# Patient Record
Sex: Male | Born: 1940
Health system: Southern US, Community
[De-identification: ages and names within clinical notes are randomized; demographics above are authoritative.]

## PROBLEM LIST (undated history)

## (undated) DIAGNOSIS — K219 Gastro-esophageal reflux disease without esophagitis: Secondary | ICD-10-CM

## (undated) DIAGNOSIS — T8859XA Other complications of anesthesia, initial encounter: Secondary | ICD-10-CM

## (undated) DIAGNOSIS — R7303 Prediabetes: Secondary | ICD-10-CM

## (undated) DIAGNOSIS — I10 Essential (primary) hypertension: Secondary | ICD-10-CM

## (undated) DIAGNOSIS — M1990 Unspecified osteoarthritis, unspecified site: Secondary | ICD-10-CM

## (undated) DIAGNOSIS — R7989 Other specified abnormal findings of blood chemistry: Secondary | ICD-10-CM

## (undated) DIAGNOSIS — Z87442 Personal history of urinary calculi: Secondary | ICD-10-CM

## (undated) DIAGNOSIS — N400 Enlarged prostate without lower urinary tract symptoms: Secondary | ICD-10-CM

## (undated) DIAGNOSIS — N10 Acute tubulo-interstitial nephritis: Secondary | ICD-10-CM

## (undated) DIAGNOSIS — D126 Benign neoplasm of colon, unspecified: Secondary | ICD-10-CM

## (undated) DIAGNOSIS — K5790 Diverticulosis of intestine, part unspecified, without perforation or abscess without bleeding: Secondary | ICD-10-CM

## (undated) HISTORY — PX: VASECTOMY: SHX75

## (undated) HISTORY — PX: CARPAL TUNNEL RELEASE: SHX101

## (undated) HISTORY — PX: HERNIA REPAIR: SHX51

## (undated) HISTORY — DX: Diverticulosis of intestine, part unspecified, without perforation or abscess without bleeding: K57.90

## (undated) HISTORY — DX: Benign neoplasm of colon, unspecified: D12.6

## (undated) HISTORY — PX: PROSTATE BIOPSY: SHX241

## (undated) HISTORY — PX: EYE SURGERY: SHX253

## (undated) HISTORY — DX: Acute pyelonephritis: N10

## (undated) HISTORY — PX: OTHER SURGICAL HISTORY: SHX169

## (undated) HISTORY — DX: Benign prostatic hyperplasia without lower urinary tract symptoms: N40.0

## (undated) HISTORY — PX: COLONOSCOPY: SHX174

---

## 2004-09-02 ENCOUNTER — Ambulatory Visit: Payer: Self-pay | Admitting: Family Medicine

## 2004-09-25 ENCOUNTER — Ambulatory Visit: Payer: Self-pay | Admitting: Internal Medicine

## 2005-05-27 ENCOUNTER — Ambulatory Visit: Payer: Self-pay | Admitting: Internal Medicine

## 2005-07-28 ENCOUNTER — Ambulatory Visit: Payer: Self-pay | Admitting: Internal Medicine

## 2005-08-05 ENCOUNTER — Ambulatory Visit: Payer: Self-pay | Admitting: Internal Medicine

## 2005-08-19 ENCOUNTER — Ambulatory Visit: Payer: Self-pay | Admitting: Cardiology

## 2005-08-27 ENCOUNTER — Ambulatory Visit: Payer: Self-pay

## 2007-02-08 ENCOUNTER — Ambulatory Visit: Payer: Self-pay | Admitting: Internal Medicine

## 2007-02-08 DIAGNOSIS — R7989 Other specified abnormal findings of blood chemistry: Secondary | ICD-10-CM | POA: Insufficient documentation

## 2007-02-08 DIAGNOSIS — K4090 Unilateral inguinal hernia, without obstruction or gangrene, not specified as recurrent: Secondary | ICD-10-CM | POA: Insufficient documentation

## 2007-02-08 LAB — CONVERTED CEMR LAB
Creatinine,U: 193.2 mg/dL
Hgb A1c MFr Bld: 6.2 % — ABNORMAL HIGH (ref 4.6–6.0)
Microalb Creat Ratio: 4.7 mg/g (ref 0.0–30.0)
Microalb, Ur: 0.9 mg/dL (ref 0.0–1.9)

## 2007-04-01 ENCOUNTER — Encounter: Admission: RE | Admit: 2007-04-01 | Discharge: 2007-04-01 | Payer: Self-pay | Admitting: General Surgery

## 2007-04-05 ENCOUNTER — Ambulatory Visit (HOSPITAL_BASED_OUTPATIENT_CLINIC_OR_DEPARTMENT_OTHER): Admission: RE | Admit: 2007-04-05 | Discharge: 2007-04-05 | Payer: Self-pay | Admitting: General Surgery

## 2007-05-07 ENCOUNTER — Ambulatory Visit: Payer: Self-pay | Admitting: Internal Medicine

## 2007-05-07 DIAGNOSIS — R42 Dizziness and giddiness: Secondary | ICD-10-CM | POA: Insufficient documentation

## 2007-07-13 ENCOUNTER — Ambulatory Visit: Payer: Self-pay | Admitting: Internal Medicine

## 2007-07-13 DIAGNOSIS — M25469 Effusion, unspecified knee: Secondary | ICD-10-CM

## 2007-07-20 ENCOUNTER — Encounter: Payer: Self-pay | Admitting: Internal Medicine

## 2007-07-20 ENCOUNTER — Ambulatory Visit (HOSPITAL_COMMUNITY): Admission: RE | Admit: 2007-07-20 | Discharge: 2007-07-20 | Payer: Self-pay | Admitting: Orthopedic Surgery

## 2007-07-21 ENCOUNTER — Encounter: Admission: RE | Admit: 2007-07-21 | Discharge: 2007-07-21 | Payer: Self-pay | Admitting: Orthopedic Surgery

## 2008-05-01 ENCOUNTER — Ambulatory Visit: Payer: Self-pay | Admitting: Internal Medicine

## 2008-05-01 DIAGNOSIS — K5730 Diverticulosis of large intestine without perforation or abscess without bleeding: Secondary | ICD-10-CM | POA: Insufficient documentation

## 2008-05-01 DIAGNOSIS — G5600 Carpal tunnel syndrome, unspecified upper limb: Secondary | ICD-10-CM | POA: Insufficient documentation

## 2008-05-01 DIAGNOSIS — K219 Gastro-esophageal reflux disease without esophagitis: Secondary | ICD-10-CM

## 2008-05-01 DIAGNOSIS — M7580 Other shoulder lesions, unspecified shoulder: Secondary | ICD-10-CM

## 2008-05-01 DIAGNOSIS — R7309 Other abnormal glucose: Secondary | ICD-10-CM

## 2008-05-01 DIAGNOSIS — R9431 Abnormal electrocardiogram [ECG] [EKG]: Secondary | ICD-10-CM

## 2008-05-01 DIAGNOSIS — M25819 Other specified joint disorders, unspecified shoulder: Secondary | ICD-10-CM | POA: Insufficient documentation

## 2008-05-01 DIAGNOSIS — R079 Chest pain, unspecified: Secondary | ICD-10-CM

## 2008-05-01 DIAGNOSIS — R7303 Prediabetes: Secondary | ICD-10-CM | POA: Insufficient documentation

## 2008-05-02 ENCOUNTER — Encounter (INDEPENDENT_AMBULATORY_CARE_PROVIDER_SITE_OTHER): Payer: Self-pay | Admitting: *Deleted

## 2008-05-03 LAB — CONVERTED CEMR LAB
Free T4: 0.8 ng/dL (ref 0.6–1.6)
Hgb A1c MFr Bld: 6.1 % — ABNORMAL HIGH (ref 4.6–6.0)

## 2008-05-04 ENCOUNTER — Encounter (INDEPENDENT_AMBULATORY_CARE_PROVIDER_SITE_OTHER): Payer: Self-pay | Admitting: *Deleted

## 2008-05-17 ENCOUNTER — Ambulatory Visit: Payer: Self-pay | Admitting: Cardiology

## 2008-05-22 ENCOUNTER — Ambulatory Visit: Payer: Self-pay

## 2008-05-22 ENCOUNTER — Encounter: Payer: Self-pay | Admitting: Internal Medicine

## 2008-06-12 ENCOUNTER — Telehealth (INDEPENDENT_AMBULATORY_CARE_PROVIDER_SITE_OTHER): Payer: Self-pay | Admitting: *Deleted

## 2008-06-19 ENCOUNTER — Encounter: Payer: Self-pay | Admitting: Internal Medicine

## 2008-09-11 ENCOUNTER — Telehealth: Payer: Self-pay | Admitting: Internal Medicine

## 2008-09-13 ENCOUNTER — Ambulatory Visit: Payer: Self-pay | Admitting: Internal Medicine

## 2008-09-13 DIAGNOSIS — N401 Enlarged prostate with lower urinary tract symptoms: Secondary | ICD-10-CM

## 2008-09-13 DIAGNOSIS — N138 Other obstructive and reflux uropathy: Secondary | ICD-10-CM

## 2008-09-13 LAB — CONVERTED CEMR LAB
Bilirubin Urine: NEGATIVE
Ketones, urine, test strip: NEGATIVE
Nitrite: NEGATIVE
Specific Gravity, Urine: 1.015
Urobilinogen, UA: 0.2
WBC Urine, dipstick: NEGATIVE

## 2008-09-15 ENCOUNTER — Telehealth: Payer: Self-pay | Admitting: Internal Medicine

## 2008-09-15 ENCOUNTER — Encounter: Payer: Self-pay | Admitting: Internal Medicine

## 2008-09-15 LAB — CONVERTED CEMR LAB: PSA: 119.9 ng/mL — ABNORMAL HIGH (ref 0.10–4.00)

## 2008-09-18 ENCOUNTER — Encounter (INDEPENDENT_AMBULATORY_CARE_PROVIDER_SITE_OTHER): Payer: Self-pay | Admitting: *Deleted

## 2008-09-25 ENCOUNTER — Encounter (INDEPENDENT_AMBULATORY_CARE_PROVIDER_SITE_OTHER): Payer: Self-pay | Admitting: *Deleted

## 2008-10-12 ENCOUNTER — Ambulatory Visit: Payer: Self-pay | Admitting: Internal Medicine

## 2008-10-13 ENCOUNTER — Telehealth (INDEPENDENT_AMBULATORY_CARE_PROVIDER_SITE_OTHER): Payer: Self-pay | Admitting: *Deleted

## 2008-10-13 ENCOUNTER — Encounter (INDEPENDENT_AMBULATORY_CARE_PROVIDER_SITE_OTHER): Payer: Self-pay | Admitting: *Deleted

## 2008-11-06 ENCOUNTER — Ambulatory Visit: Payer: Self-pay | Admitting: Internal Medicine

## 2008-11-06 DIAGNOSIS — R972 Elevated prostate specific antigen [PSA]: Secondary | ICD-10-CM | POA: Insufficient documentation

## 2008-11-07 ENCOUNTER — Telehealth (INDEPENDENT_AMBULATORY_CARE_PROVIDER_SITE_OTHER): Payer: Self-pay | Admitting: *Deleted

## 2008-11-07 ENCOUNTER — Encounter (INDEPENDENT_AMBULATORY_CARE_PROVIDER_SITE_OTHER): Payer: Self-pay | Admitting: *Deleted

## 2008-11-22 ENCOUNTER — Encounter: Payer: Self-pay | Admitting: Internal Medicine

## 2008-12-05 ENCOUNTER — Encounter: Payer: Self-pay | Admitting: Internal Medicine

## 2009-01-04 ENCOUNTER — Encounter: Payer: Self-pay | Admitting: Internal Medicine

## 2009-04-25 ENCOUNTER — Encounter: Payer: Self-pay | Admitting: Internal Medicine

## 2009-07-30 ENCOUNTER — Encounter (INDEPENDENT_AMBULATORY_CARE_PROVIDER_SITE_OTHER): Payer: Self-pay | Admitting: *Deleted

## 2009-08-02 ENCOUNTER — Ambulatory Visit: Payer: Self-pay | Admitting: Internal Medicine

## 2009-08-08 ENCOUNTER — Encounter (INDEPENDENT_AMBULATORY_CARE_PROVIDER_SITE_OTHER): Payer: Self-pay | Admitting: *Deleted

## 2009-08-13 ENCOUNTER — Encounter: Payer: Self-pay | Admitting: Internal Medicine

## 2009-08-16 ENCOUNTER — Ambulatory Visit (HOSPITAL_BASED_OUTPATIENT_CLINIC_OR_DEPARTMENT_OTHER): Admission: RE | Admit: 2009-08-16 | Discharge: 2009-08-16 | Payer: Self-pay | Admitting: Orthopedic Surgery

## 2009-10-31 ENCOUNTER — Encounter: Payer: Self-pay | Admitting: Internal Medicine

## 2009-11-22 ENCOUNTER — Ambulatory Visit: Payer: Self-pay | Admitting: Internal Medicine

## 2009-11-22 DIAGNOSIS — D171 Benign lipomatous neoplasm of skin and subcutaneous tissue of trunk: Secondary | ICD-10-CM | POA: Insufficient documentation

## 2009-11-22 DIAGNOSIS — D179 Benign lipomatous neoplasm, unspecified: Secondary | ICD-10-CM

## 2010-01-29 ENCOUNTER — Telehealth (INDEPENDENT_AMBULATORY_CARE_PROVIDER_SITE_OTHER): Payer: Self-pay | Admitting: *Deleted

## 2010-02-04 ENCOUNTER — Ambulatory Visit: Payer: Self-pay | Admitting: Internal Medicine

## 2010-02-04 DIAGNOSIS — R5381 Other malaise: Secondary | ICD-10-CM | POA: Insufficient documentation

## 2010-02-04 DIAGNOSIS — R5383 Other fatigue: Secondary | ICD-10-CM

## 2010-02-04 DIAGNOSIS — N529 Male erectile dysfunction, unspecified: Secondary | ICD-10-CM

## 2010-03-01 ENCOUNTER — Telehealth (INDEPENDENT_AMBULATORY_CARE_PROVIDER_SITE_OTHER): Payer: Self-pay | Admitting: *Deleted

## 2010-03-01 ENCOUNTER — Encounter: Payer: Self-pay | Admitting: Internal Medicine

## 2010-03-01 ENCOUNTER — Emergency Department (HOSPITAL_COMMUNITY): Admission: EM | Admit: 2010-03-01 | Discharge: 2010-03-01 | Payer: Self-pay | Admitting: Emergency Medicine

## 2010-03-13 ENCOUNTER — Ambulatory Visit: Payer: Self-pay | Admitting: Internal Medicine

## 2010-03-13 DIAGNOSIS — M2550 Pain in unspecified joint: Secondary | ICD-10-CM | POA: Insufficient documentation

## 2010-03-13 DIAGNOSIS — G4700 Insomnia, unspecified: Secondary | ICD-10-CM

## 2010-03-14 ENCOUNTER — Encounter: Payer: Self-pay | Admitting: Internal Medicine

## 2010-03-14 LAB — CONVERTED CEMR LAB
Sed Rate: 5 mm/hr (ref 0–22)
TSH: 1.29 microintl units/mL (ref 0.35–5.50)

## 2010-03-28 LAB — CONVERTED CEMR LAB: Rhuematoid fact SerPl-aCnc: <20 intl units/mL (ref 0–20)

## 2010-05-08 ENCOUNTER — Encounter: Payer: Self-pay | Admitting: Internal Medicine

## 2010-10-01 NOTE — Progress Notes (Signed)
Summary: FYII Daughter's Concerns  Phone Note Call from Patient Message from:  Pharmacy  Caller: Patient's Daughter  ~ Arline Asp Summary of Call: Patient's daughter called to inform us that she is worried about her father. Every 2-3 weeks he has an episode of fever, severe fatigue, chills, headache, blurry vision but says it is just a bug every time. He has recently had 7-10 tick bites and he thinks it is no big deal. The daughter would like to have him tested for lyme disease or rocky [place] spotted fever. What should she do?  Her number is [phone] Initial call taken by: Harold Barban,  Jan 29, 2010 4:26 PM  Follow-up for Phone Call        spoke with pt daughter advise pt needs OV to discuss further. pt daughter advise that no info can be discuss due to privacy but info provided by her is indicated in pt chart.Pt daughter states that is all she wants is dr hopper to be aware because her father will not come in for OV...........Marland KitchenFelecia Deloach CMA  Jan 29, 2010 4:49 PM   Additional Follow-up for Phone Call Additional follow up Details #1::        He can be seen 01/30/2010 Additional Follow-up by: Marga Melnick MD,  Jan 29, 2010 5:13 PM    Additional Follow-up for Phone Call Additional follow up Details #2::    lm am re appt.Okey Regal [place]  January 30, 2010 3:07 PM  patient didnt return call - spoke with patient daughter cindy she said best # to call is pt cell [phone] - lm on that #--- patient daughter said he just isnt the same  .Marland KitchenOkey Regal Spring  January 31, 2010 11:11 AM   Additional Follow-up for Phone Call Additional follow up Details #3:: Details for Additional Follow-up Action Taken: ? he has appt in am 06/02  Patient has an appt on 6.6.11.Harold Barban  February 01, 2010 8:51 AM Additional Follow-up by: Marga Melnick MD,  January 30, 2010 3:42 PM

## 2010-10-01 NOTE — Letter (Signed)
Summary: [facility]  [facility]   Imported By: Lanelle Bal 05/20/2010 10:38:53  _____________________________________________________________________  External Attachment:    Type:   Image     Comment:   External Document

## 2010-10-01 NOTE — Progress Notes (Signed)
Summary: Heart Attack Symptoms  Phone Note Call from Patient Call back at Home Phone [phone]   Caller: Patient Reason for Call: Acute Illness Summary of Call: Patient called and left a message on the triage line stating that he though he might have had a mild heart attack this morning. He would like [place] to call him back to check on his sypmtoms and see what he should do.   Initial call taken by: Harold Barban,  March 01, 2010 1:19 PM  Follow-up for Phone Call        Called patient back and he said that he had a sharp pain deep in his chest on the left and then it radiated through the left shoulder and down his left arm. Patient layed an Aspirin  on his toungue and then he took another one almost immediatly afterwards. Then about 15-20 minutes it had eased off and then about 30 minutes it felt much better. I informed patient that he need to drive sraight to the ER and inform them of what had happened. Patient understood and said he was on his way. He was currently driving home from [place] and was going to call his son to come meet him.  Follow-up by: Harold Barban,  March 01, 2010 1:19 PM  Additional Follow-up for Phone Call Additional follow up Details #1::        I ran this by Dr. Drue Novel who agreed with sending him straight to the ER.  Additional Follow-up by: Harold Barban,  March 01, 2010 1:29 PM

## 2010-10-01 NOTE — Assessment & Plan Note (Signed)
Summary: HOSPITAL FOLLOWUP//KN--rescd   Vital Signs:  Patient profile:   70 year old male Weight:      184.6 pounds Pulse rate:   64 / minute Resp:     15 per minute BP sitting:   108 / 60  (left arm) Cuff size:   large  Vitals Entered By: Shonna Chock CMA (March 13, 2010 11:33 AM) CC: Hospital F/U , Fatigue, Insomnia Comments REVIEWED MED LIST, PATIENT AGREED DOSE AND INSTRUCTION CORRECT    CC:  Hospital F/U , Fatigue, and Insomnia.  History of Present Illness:   He was seen for  L posterior chest pain 03/01/2010,ER  record reviewed. NS-T changes with negative cardiac enzymes except Myoglobin. PMH of negative Stress Test 2005  for NS ST-T  EKG changes. Since ER visit the patient reports persistent fatigue, fatigue even  without physical exertionm primarily physical fatigue.  The patient also reports  occasional dyspnea.  The patient denies fever, night sweats, weight loss, exertional chest pain, cough,pleuritic pain, hemoptysis, or new medications.  Other symptoms include severe snoring; no aopnea noted.  The patient denies the following symptoms: leg swelling, orthopnea, PND, melena, adenopathy, daytime sleepiness, and skin changes.  He has had  poor sleep for several years.  The patient denies feeling depressed and altered appetite.        The patient also presents with Insomnia.  The patient reports difficulty falling asleep, frequent awakening, and nightmares, but denies early awakening and leg movements.  Insomnia has led to problems with possible memory loss.  Risk factors for insomnia include caffeine use, 1 pot of coffee/ day & tea through day.  Behaviors that may contribute to insomnia include reading in bed.    Allergies (verified): No Known Drug Allergies  Review of Systems MS:  Complains of joint pain, low back pain, muscle weakness, and stiffness; denies joint redness and joint swelling; Shoulder pain bilaterally  affects sleep.RUE weakness.  Physical Exam  General:   well-nourished,in no acute distress; alert,appropriate and cooperative throughout examination Neck:  No deformities, masses, or tenderness noted.Full ROM Lungs:  Normal respiratory effort, chest expands symmetrically. Lungs are clear to auscultation, no crackles or wheezes. Heart:  Normal rate and regular rhythm. S1 and S2 normal without gallop, murmur, click, rub.S4 Extremities:  No clubbing, cyanosis, edema. Mixed arthritic hand changes. Crepitus R > L shoulder. Neg Homan's Skin:  "chigger bites" on lower legs Cervical Nodes:  No lymphadenopathy noted Axillary Nodes:  No palpable lymphadenopathy Psych:  memory intact for recent and remote, normally interactive, and good eye contact.     Impression & Recommendations:  Problem # 1:  FATIGUE (ICD-780.79)  Orders: TLB-TSH (Thyroid Stimulating Hormone) (84443-TSH) Venipuncture ([address])  Problem # 2:  ARTHRALGIA (ICD-719.40)  Orders: Venipuncture ([address]) TLB-Rheumatoid Factor (RA) (09811-BJ) TLB-Sedimentation Rate (ESR) (85652-ESR) Prescription Created Electronically [phone])  Problem # 3:  INSOMNIA-SLEEP DISORDER-UNSPEC (ICD-780.52)  Problem # 4:  CHEST PAIN, RECURRENT (ICD-786.50) resolved; NS ST-T changes present since 2005; neg Stress Test 2005  Complete Medication List: 1)  Antivert 12.5 Mg Tabs (Meclizine hcl) .Marland Kitchen.. 1-2 q 6-8 hrs as needed vertigo 2)  Prilosec Otc 20 Mg Tbec (Omeprazole magnesium) .Marland Kitchen.. 1 by mouth qd 3)  Multivitamin  4)  Flomax 0.4 Mg Xr24h-cap (Tamsulosin hcl) .Marland Kitchen.. 1 once daily 5)  Tramadol Hcl 50 Mg Tabs (Tramadol hcl) .Marland Kitchen.. 1 at bedtime as needed for pain  Patient Instructions: 1)  Pain med at bedtime as needed . Prescriptions: TRAMADOL HCL 50 MG TABS (TRAMADOL HCL)  1 at bedtime as needed for pain  #30 x 1   Entered and Authorized by:   Marga Melnick MD   Signed by:   Marga Melnick MD on 03/13/2010   Method used:   Faxed to ...       [facility] [address]* (retail)       [address]        [place], [place]  [address]       Ph: [phone]       Fax: [phone]   RxID:   [id number]   Appended Document: Orders Update    Clinical Lists Changes  Orders: Added new Test order of T- * Misc. Laboratory test [phone]) - Signed

## 2010-10-01 NOTE — Assessment & Plan Note (Signed)
Summary: talk with doctor//lch   Vital Signs:  Patient profile:   70 year old male Weight:      181.6 pounds Pulse rate:   64 / minute Resp:     15 per minute BP sitting:   122 / 78  (left arm) Cuff size:   large  Vitals Entered By: Shonna Chock (February 04, 2010 3:28 PM) CC: Follow-up visit: Per Dr.Meeya Goldin, patient not sure why he is here Comments REVIEWED MED LIST, PATIENT AGREED DOSE AND INSTRUCTION CORRECT    CC:  Follow-up visit: Per Dr.Rekha Hobbins and patient not sure why he is here.  History of Present Illness: He is asymptomatic @ present  ; his daughter was concerned about recurrent bouts of fever,joint pains  & fatigue in context of repeated tick exposure.Her phone message was reviewed. Last episode was > 1 week ago;no symptoms @ present.  Allergies (verified): No Known Drug Allergies  Past History:  Past Surgical History: Mandibular grafting from maxilla  ; Blood poisoning  Vasectomy Colonoscopy 2004: tics Carpal tunnel release LUE  Review of Systems General:  Denies chills, fatigue, fever, sweats, and weight loss. Eyes:  Denies blurring, double vision, and vision loss-both eyes. ENT:  Denies difficulty swallowing and hoarseness. CV:  Denies palpitations. Resp:  Denies cough; Some white phlegm intermittently which he spits up from his throat. GI:  Complains of constipation and diarrhea; Intermittent C&D > 1 month ago. Derm:  Denies changes in nail beds, dryness, lesion(s), and rash. Neuro:  Complains of numbness and tingling; N&T in R hand. Endo:  Complains of heat intolerance; denies cold intolerance.  Physical Exam  General:  well-nourished,in no acute distress; alert,appropriate and cooperative throughout examination Neck:  No deformities, masses, or tenderness noted. Lungs:  Normal respiratory effort, chest expands symmetrically. Lungs are clear to auscultation, no crackles or wheezes. Heart:  Normal rate and regular rhythm. S1 and S2 normal without gallop,  murmur, click, rub .S4 Abdomen:  Bowel sounds positive,abdomen soft and non-tender without masses, organomegaly or hernias noted. Skin:  Intact without suspicious lesions or rashes Cervical Nodes:  No lymphadenopathy noted Axillary Nodes:  No palpable lymphadenopathy Psych:  memory intact for recent and remote, normally interactive, and good eye contact.     Impression & Recommendations:  Problem # 1:  FATIGUE (ICD-780.79) intermittent  Problem # 2:  FEVER (ICD-780.60) intermittent   Problem # 3:  DIARRHEA (ICD-787.91) X1  Problem # 4:  TICK BITE (ICD-E906.4) recurrent  Problem # 5:  ERECTILE DYSFUNCTION, ORGANIC (ICD-607.84)  The following medications were removed from the medication list:    Viagra 100 Mg Tabs (Sildenafil citrate) .Marland Kitchen... 1/2 -1 once daily as needed His updated medication list for this problem includes:    Levitra 20 Mg Tabs (Vardenafil hcl) ..... Once daily as needed  Complete Medication List: 1)  Antivert 12.5 Mg Tabs (Meclizine hcl) .Marland Kitchen.. 1-2 q 6-8 hrs as needed vertigo 2)  Prilosec Otc 20 Mg Tbec (Omeprazole magnesium) .Marland Kitchen.. 1 by mouth qd 3)  Multivitamin  4)  Flomax 0.4 Mg Xr24h-cap (Tamsulosin hcl) .Marland Kitchen.. 1 once daily 5)  Emg/nct, R/o Cts Lue  .... ZO:XWRUEA tunnel syndrome icd-354.0 6)  Levitra 20 Mg Tabs (Vardenafil hcl) .... Once daily as needed  Patient Instructions: 1)  Lab studies if symptoms recur: RMSF & Lyme titers; CBC& dif;sed rate;hepatic panel;TSH; BMET( Codes: E906.4,787.91,780.79,780.60). Prescriptions: LEVITRA 20 MG TABS (VARDENAFIL HCL) once daily as needed  #6 x 5   Entered and Authorized by:   Chrissie Noa  Artemis Koller MD   Signed by:   Marga Melnick MD on 02/04/2010   Method used:   Print then Give to Patient   RxID:   [id number]

## 2010-10-01 NOTE — Assessment & Plan Note (Signed)
Summary: FOR A BUMP ON HIS BACK//PH   Vital Signs:  Patient profile:   70 year old male Weight:      186.8 pounds Temp:     98.5 degrees F oral Pulse rate:   68 / minute Resp:     14 per minute BP sitting:   130 / 68  (left arm) Cuff size:   large  Vitals Entered By: Shonna Chock (November 22, 2009 3:34 PM) CC: Examine Back Comments REVIEWED MED LIST, PATIENT AGREED DOSE AND INSTRUCTION CORRECT    CC:  Examine Back.  History of Present Illness: His son noted "knot" on his back 11/17/2009; he is asymptomatic.  Allergies (verified): No Known Drug Allergies  Review of Systems General:  Denies chills, fever, sweats, and weight loss. Derm:  Denies changes in color of skin and itching.  Physical Exam  General:  well-nourished,in no acute distress; alert,appropriate and cooperative throughout examination Abdomen:  Bowel sounds positive,abdomen soft and non-tender without masses, organomegaly or hernias noted. Skin:  5X 5 cm lipoma R upper back which transilluminates & is not fixed to subcutaneous structures Cervical Nodes:  No lymphadenopathy noted Axillary Nodes:  No palpable lymphadenopathy   Impression & Recommendations:  Problem # 1:  LIPOMA (ICD-214.9)  Complete Medication List: 1)  Antivert 12.5 Mg Tabs (Meclizine hcl) .Marland Kitchen.. 1-2 q 6-8 hrs as needed vertigo 2)  Prilosec Otc 20 Mg Tbec (Omeprazole magnesium) .Marland Kitchen.. 1 by mouth qd 3)  Multivitamin  4)  Viagra 100 Mg Tabs (Sildenafil citrate) .... 1/2 -1 once daily as needed 5)  Flomax 0.4 Mg Xr24h-cap (Tamsulosin hcl) .Marland Kitchen.. 1 once daily 6)  Emg/nct, R/o Cts Lue  .... UE:AVWUJW tunnel syndrome icd-354.0  Patient Instructions: 1)  Go to Web MD for LIPOMA; no treatment unless it gets infected

## 2010-10-01 NOTE — Letter (Signed)
Summary: [facility]  [facility]   Imported By: Lanelle Bal 11/09/2009 08:48:15  _____________________________________________________________________  External Attachment:    Type:   Image     Comment:   External Document

## 2010-11-06 ENCOUNTER — Encounter: Payer: Self-pay | Admitting: Internal Medicine

## 2010-11-17 LAB — DIFFERENTIAL
Basophils Relative: 1 % (ref 0–1)
Lymphocytes Relative: 42 % (ref 12–46)
Lymphs Abs: 2.9 10*3/uL (ref 0.7–4.0)
Monocytes Absolute: 0.5 10*3/uL (ref 0.1–1.0)
Monocytes Relative: 7 % (ref 3–12)

## 2010-11-17 LAB — POCT I-STAT, CHEM 8
BUN: 9 mg/dL (ref 6–23)
Chloride: 105 mEq/L (ref 96–112)
Creatinine, Ser: 0.8 mg/dL (ref 0.4–1.5)
HCT: 46 % (ref 39.0–52.0)
Hemoglobin: 15.6 g/dL (ref 13.0–17.0)
Potassium: 3.8 mEq/L (ref 3.5–5.1)
Sodium: 139 mEq/L (ref 135–145)
TCO2: 25 mmol/L (ref 0–100)

## 2010-11-17 LAB — POCT CARDIAC MARKERS: Myoglobin, poc: 47.8 ng/mL (ref 12–200)

## 2010-11-17 LAB — CBC
MCH: 33.8 pg (ref 26.0–34.0)
MCV: 97.7 fL (ref 78.0–100.0)
WBC: 7 10*3/uL (ref 4.0–10.5)

## 2010-11-19 NOTE — Letter (Signed)
Summary: [facility]  [facility]   Imported By: Maryln Gottron 11/14/2010 09:50:51  _____________________________________________________________________  External Attachment:    Type:   Image     Comment:   External Document

## 2010-11-21 ENCOUNTER — Ambulatory Visit (INDEPENDENT_AMBULATORY_CARE_PROVIDER_SITE_OTHER): Payer: Medicare Other | Admitting: Internal Medicine

## 2010-11-21 ENCOUNTER — Encounter: Payer: Self-pay | Admitting: Internal Medicine

## 2010-11-21 VITALS — BP 124/70 | HR 56 | Temp 98.0°F | Ht 67.5 in | Wt 171.8 lb

## 2010-11-21 DIAGNOSIS — M2550 Pain in unspecified joint: Secondary | ICD-10-CM

## 2010-11-21 DIAGNOSIS — M7541 Impingement syndrome of right shoulder: Secondary | ICD-10-CM

## 2010-11-21 LAB — URIC ACID: Uric Acid, Serum: 6 mg/dL (ref 4.0–7.8)

## 2010-11-21 LAB — SEDIMENTATION RATE: Sed Rate: 10 mm/hr (ref 0–22)

## 2010-11-21 MED ORDER — TRAMADOL HCL 50 MG PO TABS: 50.0000 mg | ORAL_TABLET | Freq: Four times a day (QID) | ORAL | Status: DC | PRN

## 2010-11-21 NOTE — Progress Notes (Signed)
  Subjective:    Patient ID: Andrew Foster, male    DOB: Nov 14, 1940, 70 y.o.   MRN: [id number]  HPI he describes diffuse arthralgias which is a chronic problem but worse over  the past month. 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 He has a history of right shoulder impingement syndrome ; he has also had carpal tunnel surgery release at the left wrist. He's been taking tramadol 50 mg once daily with minimal response.    he also has some lumbosacral area pain with intermittent radiation of pain into the left lower extremity. This is mainly when supine.    his sister has rheumatoid arthritis and his mother osteoarthritis    Review of Systems he denies fever chills or sweats. He's had a 10 pound weight loss which he relates to not eating in restaurants. He denies any significant rash. He also denies urinary or stool incontinence.  He also denies numbness or tingling in the extremities. He denies any significant headaches.    his urologist, Dr Brunilda Payor, has prescribed Cipro for one month because of a recent elevation of his PSA up to 10.        Objective:   Physical Exam on exam he is in obvious discomfort. His gait is very stiff. He is able to walk on his tiptoes and heels without difficulty.   He has no lymphadenopathy about the head neck or axilla.  There is no tenderness to palpation over the temples.   range of motion is normal in the joints except for the hands which appear swollen. He has crepitus in the left shoulder greater than right . No rashes present.  Deep tendon reflexes and strength in all extremities is normal. Straight leg raising is also negative.        Assessment & Plan:   #1 diffuse arthralgias; rule out rheumatoid arthritis  or  polymyalgia rheumatica; the latter is less likely because of the significant symptoms in the hands.    #2 right shoulder impingement syndrome. He states that he would like to have a reassessment by an orthopedist. This  will be scheduled after the acute issue is addressed.   Plan : #1 RA titers ; sedimentation rate; uric acid .    he'll be asked to take over-the-counter chondroitin which has been shown to help arthritis especially in the hands. He will be asked to take the tramadol every 6 hours as needed. Additionally 2 arthritis strength Tylenol at bedtime would be recommended supplement.

## 2010-11-21 NOTE — Patient Instructions (Signed)
Take the tramadol every 6 hours as needed  For pain. Take arthritis strength Tylenol at bedtime if having active joint symptoms. Also take Chondroitin , an over-the-counter supplement as the label dictates. This has been shown to help  arthralgias in the hand in recent medical studies.

## 2010-11-21 NOTE — Progress Notes (Signed)
Addended byMarga Melnick on: 11/21/2010 11:17 AM   Modules accepted: Level of Service

## 2010-11-22 LAB — RHEUMATOID FACTOR: Rhuematoid fact SerPl-aCnc: <10 IU/mL (ref <=14)

## 2010-12-16 ENCOUNTER — Ambulatory Visit (HOSPITAL_BASED_OUTPATIENT_CLINIC_OR_DEPARTMENT_OTHER)
Admission: RE | Admit: 2010-12-16 | Discharge: 2010-12-16 | Disposition: A | Discharge status: Home / Self Care | Payer: Medicare Other | Source: Ambulatory Visit | Attending: Orthopedic Surgery | Admitting: Orthopedic Surgery

## 2010-12-16 DIAGNOSIS — J449 Chronic obstructive pulmonary disease, unspecified: Secondary | ICD-10-CM | POA: Insufficient documentation

## 2010-12-16 DIAGNOSIS — Z87891 Personal history of nicotine dependence: Secondary | ICD-10-CM | POA: Insufficient documentation

## 2010-12-16 DIAGNOSIS — K219 Gastro-esophageal reflux disease without esophagitis: Secondary | ICD-10-CM | POA: Insufficient documentation

## 2010-12-16 DIAGNOSIS — G5600 Carpal tunnel syndrome, unspecified upper limb: Secondary | ICD-10-CM | POA: Insufficient documentation

## 2010-12-16 DIAGNOSIS — Z01812 Encounter for preprocedural laboratory examination: Secondary | ICD-10-CM | POA: Insufficient documentation

## 2010-12-16 DIAGNOSIS — J4489 Other specified chronic obstructive pulmonary disease: Secondary | ICD-10-CM | POA: Insufficient documentation

## 2011-01-14 NOTE — Op Note (Signed)
Andrew Foster, Andrew Foster               ACCOUNT NO.:  [id number]   MEDICAL RECORD NO.:  [id number]          PATIENT TYPE:  AMB   LOCATION:  DSC                          FACILITY:  [facility]   PHYSICIAN:  Cherylynn Ridges, M.D.    DATE OF BIRTH:  May 31, 1941   DATE OF PROCEDURE:  04/05/2007  DATE OF DISCHARGE:                               OPERATIVE REPORT   PREOPERATIVE DIAGNOSIS:  Large right inguinal hernia.   POSTOPERATIVE DIAGNOSIS:  Large indirect right inguinal hernia.   PROCEDURE:  Right inguinal hernia repair with mesh.   SURGEON:  Cherylynn Ridges, M.D.   ANESTHESIA:  General with a laryngeal airway.   ESTIMATED BLOOD LOSS:  Less than 20 mL.   COMPLICATIONS:  None.   CONDITION:  Stable.   SPECIMENS:  No specimens were sent.   INDICATIONS FOR OPERATION:  The patient is an otherwise healthy 70-year-  old gentleman with a symptomatic enlarged right inguinal hernia, who  comes in for repair.   FINDINGS:  The patient had a large indirect sac, which was resected and  ligated at its base.  He had a small defect in the direct floor.   OPERATION:  The patient was taken to the operating room, placed on the  table in supine position.  After an adequate general laryngeal airway  anesthetic was administered, he was prepped and draped in the usual  sterile manner, exposing the right inguinal area.   A 5.5 to 6-cm transverse curvilinear incision was made with a #10 blade  and taken down through the subcutaneous tissue using electrocautery down  to the external oblique fascia.  With retractors in place, we opened the  fascia through its fibers down through the superficial ring and then  mobilized the spermatic cord using a Penrose drain.  We subsequently  placed the spermatic cord up on a weighted retractor and dissected out  the hernia sac, and that was on the anteromedial aspect of the cord.  The sac was clamped at its base with a hemostat clamp and then suture  ligated with 2 stitches of  0 Ethibond suture.  Once this was done, the  excess sac was resected.  Then, the retracted portion of the transected  sac fell back underneath the internal ring.  While retracting out the  spermatic cord, we placed an oval piece of mesh that measured  approximately 4 x 3 cm in size to the pubic tubercle, and then  anteromedially to the conjoined tendon and inferolaterally to the  reflected portion of the inguinal ligament.  This was done using 0  Prolene suture.  We irrigated the mesh with antibiotic solution and  soaked it in solution prior to implantation.  Once the mesh was  implanted, we allowed the cord to fall back into the canal, and then we  sutured the external oblique fascia on top of the cord using a running 3-  0 Vicryl suture.  Scarpa fascia was reapproximated using interrupted 3-0  Vicryl sutures.  Care was taken not to entrap the ilioinguinal or  iliohypogastric nerves.  The skin was closed using  a running  subcuticular stitch of 4-0 Monocryl.  A sterile dressing was applied.  Marcaine 0.5% without epi was injected into the wound prior to closure;  approximately 19-20 cc used.  A sterile dressing was applied.  All  counts were correct.      Cherylynn Ridges, M.D.  Electronically Signed     JOW/MEDQ  D:  04/05/2007  T:  04/05/2007  Job:  [id number]   cc:   Titus Dubin. Alwyn Ren, MD,FACP,FCCP

## 2011-01-14 NOTE — Assessment & Plan Note (Signed)
[facility]                            CARDIOLOGY OFFICE NOTE   NAME:Andrew Foster, Andrew Foster                      MRN:          [id number]  DATE:05/17/2008                            DOB:          June 29, 1941    I was asked by Dr. Marga Melnick to evaluate and consult on [facility] with some chest tightness and an abnormal EKG.   HISTORY OF PRESENT ILLNESS:  Andrew Foster is a 70 year old widowed white  male who has been evaluated here on several occasions, last time in 2006  with an abnormal EKG.  At those times, he was asymptomatic but had risk  factors for coronary artery disease.  His last stress Myoview was on  August 27, 2005, with good exercise tolerance exercising a total of 11  minutes for 13.4 METS.  He had a normal blood pressure response to  exercise, some ectopy was noted.  He had some ST-segment changes at  baseline and with exercise.  Perfusion scan was normal.  EF was 57%.   Last week, he woke one morning and after showering developed a tightness  in the midchest.  He had no nausea, shortness of breath and no sweating.  It did not radiate.  He has not had any stents.   He saw Dr. hopper the other day and there was an exaggerated changes on  his EKG with ST-segment depression in the inferior and lateral leads  with biphasic T-waves.   His risk factors for coronary artery disease include age, sex, and  remote smoking.  He quit in 1976, however.   PAST MEDICAL HISTORY:  He has no history of dye allergy.  He is not  allergic to any medications.  He used to smoke but quit in 1976.  He  does not drink alcohol.   SURGICAL HISTORY:  He had hernia repair in 2008, mastectomy in 1965,  bone grafts in 2005.   MEDICATIONS:  Glucosamine, Antivert, Prilosec and multivitamin.   FAMILY HISTORY:  His mother died of heart attack at age 54.   SOCIAL HISTORY:  He is retired, December 2004.  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  He has 4 children.   REVIEW OF SYSTEMS:  Other than the HPI is positive for hiatal hernia,  gastroesophageal reflux, sexual dysfunction, some arthritis and  borderline diabetes.   PHYSICAL EXAMINATION:  VITAL SIGNS:  Today, his blood pressure was  112/72, his pulse 70 and regular, his EKG again shows ST-segment  flattening in II, III and aVF and he has RSR prime V1 and biphasic Ts in  V3 through V6.  His height 5 feet 9, weigh is 183 pounds.  HEENT:  Normal.  NECK:  Carotid upstrokes are equal bilaterally without bruits.  Thyroid  is not enlarged.  Trachea is midline.  LUNGS:  Clear to auscultation and percussion.  HEART:  Reveals a nondisplaced PMI.  Normal S1 and S2.  No murmur, rub,  or gallop.  ABDOMEN:  Soft, good bowel sounds.  No pulsatile mass or midline bruit.  EXTREMITIES:  No cyanosis,  clubbing, or edema.  Pulses are intact.  There is no sign of DVT.  NEURO:  Intact.  SKIN:  Unremarkable.   ASSESSMENT:  1. Chest tightness x1 with no recurrence, abnormal EKG which is      somewhat chronic, rule out obstructive coronary artery disease.   PLAN:  Exercise rest stress Myoview.  If he has any evidence of  ischemia, we will need a heart catheterization.  All questions were  answered.     Thomas C. Daleen Squibb, MD, [facility]  Electronically Signed    TCW/MedQ  DD: 05/17/2008  DT: 05/17/2008  Job #: [id number]   cc:   Titus Dubin. Alwyn Ren, MD,FACP,FCCP

## 2011-01-28 ENCOUNTER — Other Ambulatory Visit: Payer: Self-pay | Admitting: Internal Medicine

## 2011-01-28 NOTE — Telephone Encounter (Signed)
Last EKG/OV 03/14/2010, Please advise

## 2011-01-29 ENCOUNTER — Other Ambulatory Visit (INDEPENDENT_AMBULATORY_CARE_PROVIDER_SITE_OTHER): Payer: Medicare Other | Admitting: Internal Medicine

## 2011-01-29 DIAGNOSIS — M2550 Pain in unspecified joint: Secondary | ICD-10-CM

## 2011-01-29 MED ORDER — TRAMADOL HCL 50 MG PO TABS: 50.0000 mg | ORAL_TABLET | Freq: Four times a day (QID) | ORAL | Status: DC | PRN

## 2011-01-29 NOTE — Telephone Encounter (Signed)
OK X6

## 2011-01-29 NOTE — Telephone Encounter (Signed)
RX sent to pharmacy  

## 2011-01-30 ENCOUNTER — Other Ambulatory Visit: Payer: Self-pay | Admitting: Internal Medicine

## 2011-01-30 MED ORDER — SILDENAFIL CITRATE 100 MG PO TABS: 100.0000 mg | ORAL_TABLET | Freq: Every day | ORAL | Status: DC | PRN

## 2011-01-30 NOTE — Telephone Encounter (Signed)
Duplicateduplicate

## 2011-01-30 NOTE — Telephone Encounter (Signed)
RX sent to pharmacy  

## 2011-03-12 ENCOUNTER — Other Ambulatory Visit: Payer: Self-pay | Admitting: Internal Medicine

## 2011-04-02 ENCOUNTER — Other Ambulatory Visit: Payer: Self-pay | Admitting: Internal Medicine

## 2011-04-24 ENCOUNTER — Other Ambulatory Visit: Payer: Self-pay | Admitting: Internal Medicine

## 2011-05-13 ENCOUNTER — Emergency Department (HOSPITAL_COMMUNITY): Payer: Medicare Other

## 2011-05-13 ENCOUNTER — Emergency Department (HOSPITAL_COMMUNITY)
Admission: EM | Admit: 2011-05-13 | Discharge: 2011-05-13 | Disposition: A | Discharge status: Home / Self Care | Payer: Medicare Other | Attending: Emergency Medicine | Admitting: Emergency Medicine

## 2011-05-13 DIAGNOSIS — R109 Unspecified abdominal pain: Secondary | ICD-10-CM | POA: Insufficient documentation

## 2011-05-13 DIAGNOSIS — K4090 Unilateral inguinal hernia, without obstruction or gangrene, not specified as recurrent: Secondary | ICD-10-CM | POA: Insufficient documentation

## 2011-05-13 DIAGNOSIS — K219 Gastro-esophageal reflux disease without esophagitis: Secondary | ICD-10-CM | POA: Insufficient documentation

## 2011-05-13 DIAGNOSIS — N400 Enlarged prostate without lower urinary tract symptoms: Secondary | ICD-10-CM | POA: Insufficient documentation

## 2011-05-13 DIAGNOSIS — N200 Calculus of kidney: Secondary | ICD-10-CM | POA: Insufficient documentation

## 2011-05-13 LAB — URINALYSIS, ROUTINE W REFLEX MICROSCOPIC
Bilirubin Urine: NEGATIVE
Glucose, UA: NEGATIVE mg/dL
Ketones, ur: NEGATIVE mg/dL
Protein, ur: NEGATIVE mg/dL

## 2011-05-13 LAB — POCT I-STAT, CHEM 8
BUN: 26 mg/dL — ABNORMAL HIGH (ref 6–23)
Potassium: 4.1 mEq/L (ref 3.5–5.1)
Sodium: 139 mEq/L (ref 135–145)
TCO2: 25 mmol/L (ref 0–100)

## 2011-05-13 LAB — URINE MICROSCOPIC-ADD ON

## 2011-05-27 ENCOUNTER — Other Ambulatory Visit: Payer: Self-pay | Admitting: Internal Medicine

## 2011-06-16 LAB — CBC
Hemoglobin: 15.7
MCHC: 34
RBC: 4.88

## 2011-06-16 LAB — BASIC METABOLIC PANEL
CO2: 28
Calcium: 9.6
GFR calc Af Amer: >60
GFR calc non Af Amer: >60
Sodium: 137

## 2011-06-16 LAB — DIFFERENTIAL
Lymphocytes Relative: 32
Monocytes Absolute: 0.4
Monocytes Relative: 6
Neutro Abs: 4

## 2011-06-25 ENCOUNTER — Other Ambulatory Visit: Payer: Self-pay | Admitting: Internal Medicine

## 2011-08-25 IMAGING — CR DG CHEST 1V PORT
1 series · 1 of 1 positions shown · non-contrast
Comparison: 04/01/2007.

CLINICAL DATA: History of chest pain.

PORTABLE CHEST - 1 VIEW

[view not recorded]
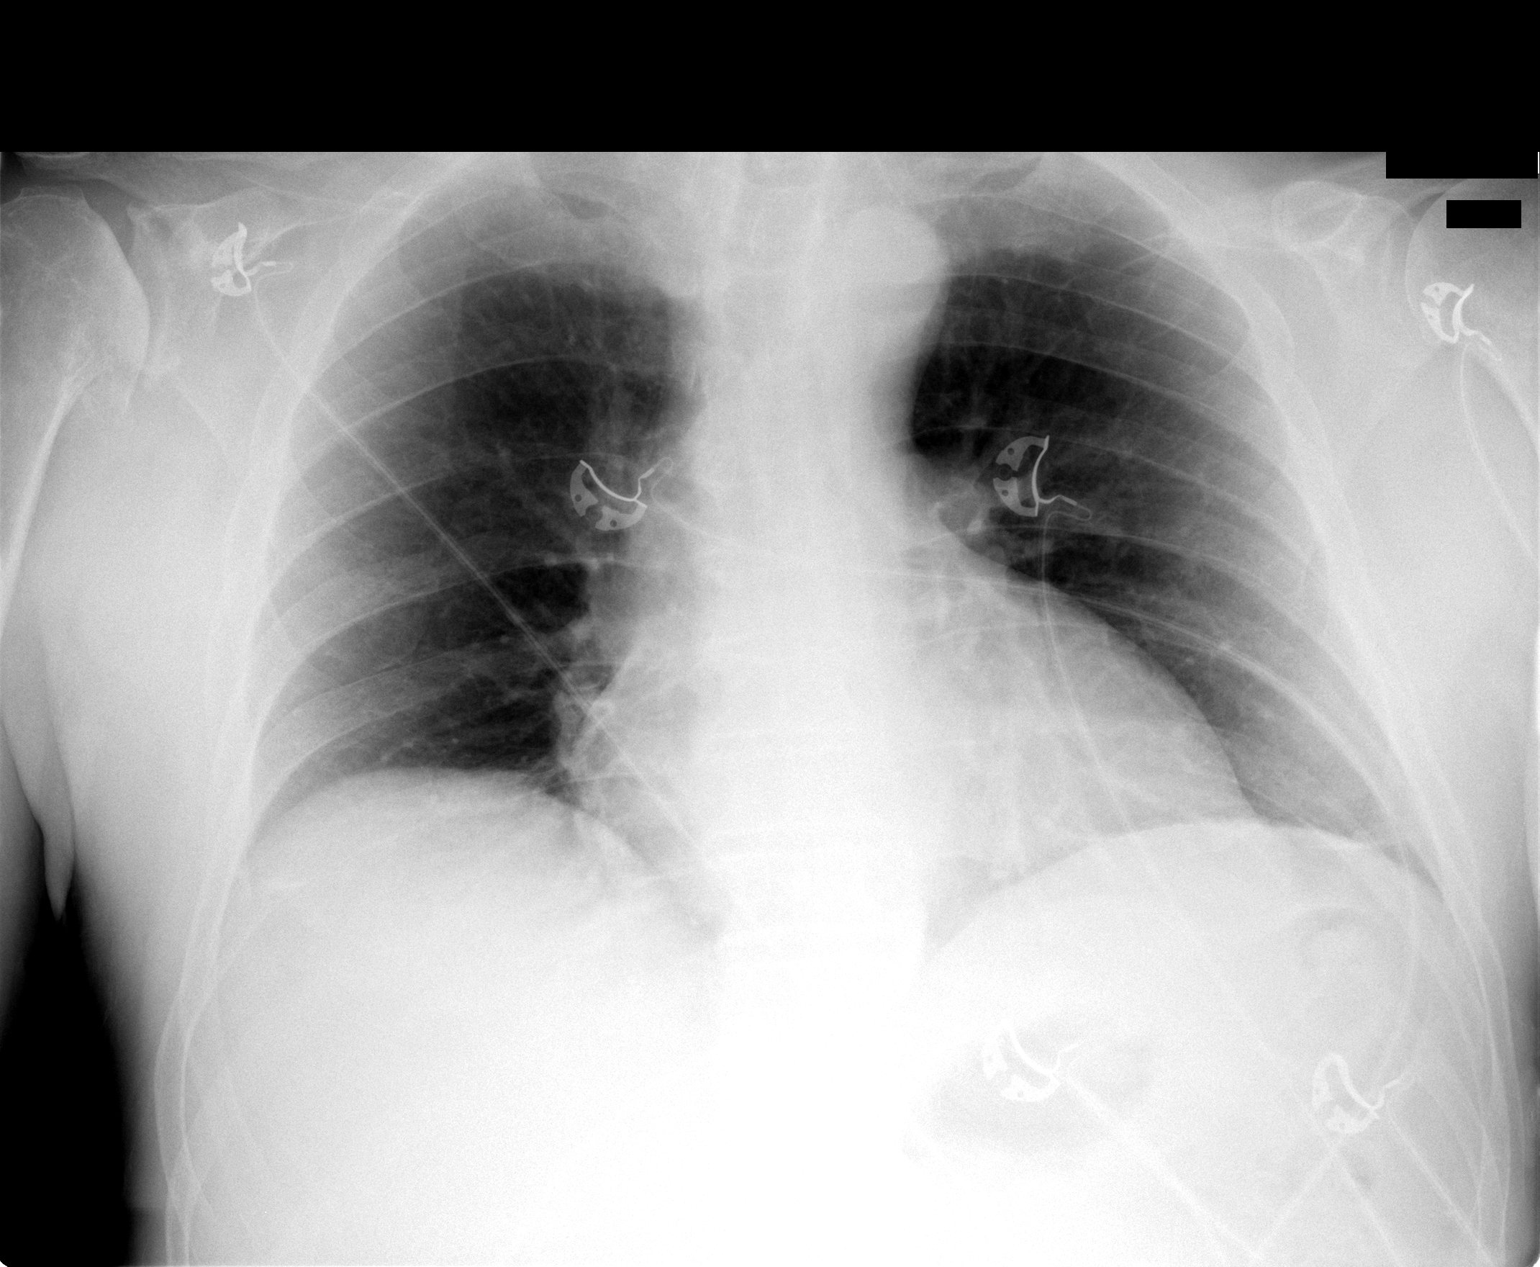

[1 of 1 positions shown; findings below may reference images not displayed]

FINDINGS: The cardiac silhouette is moderately enlarged. Cardiac
density is larger than on previous study.  There is a AP
magnification. No pleural abnormality is evident. The lungs are
well aerated and free of infiltrates. No pneumothorax is evident.
No mediastinal or hilar lesion is seen. Bones appear average for
age.
IMPRESSION: The cardiac silhouette is moderately enlarged. Cardiac density is
larger than on previous study.  No pulmonary edema, pneumonia, or
pleural effusion is seen.  No pneumothorax is evident.

## 2011-11-12 ENCOUNTER — Other Ambulatory Visit: Payer: Self-pay | Admitting: Internal Medicine

## 2011-11-12 NOTE — Telephone Encounter (Signed)
OK X1. CAUTION: Viagra cannot be taken with nitroglycerin as there could be severe cardiovascular adverse side effects.

## 2011-11-12 NOTE — Telephone Encounter (Signed)
RX sent

## 2011-11-12 NOTE — Telephone Encounter (Signed)
Please advise on refill request for Viagra, last OV 11/21/2010, no pending appointment

## 2011-11-24 ENCOUNTER — Ambulatory Visit (INDEPENDENT_AMBULATORY_CARE_PROVIDER_SITE_OTHER): Payer: Medicare Other | Admitting: Internal Medicine

## 2011-11-24 ENCOUNTER — Encounter: Payer: Self-pay | Admitting: Internal Medicine

## 2011-11-24 VITALS — BP 122/76 | HR 60 | Temp 97.5°F | Wt 172.2 lb

## 2011-11-24 DIAGNOSIS — R1031 Right lower quadrant pain: Secondary | ICD-10-CM

## 2011-11-24 MED ORDER — MECLIZINE HCL 12.5 MG PO TABS: 12.5000 mg | ORAL_TABLET | Freq: Three times a day (TID) | ORAL | Status: DC | PRN

## 2011-11-24 NOTE — Patient Instructions (Addendum)
Share results with General Surgery

## 2011-11-24 NOTE — Progress Notes (Signed)
  Subjective:    Patient ID: Andrew Foster, male    DOB: Sep 20, 1940, 71 y.o.   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 It is described as burning when sitting and intermittent he has a sharp pain.   He has not treated the pain with any medications or other interventions.When he gets a good night sleep the pain is improved. When he works it increases in intensity  after several hours.  This is the same area where he had an inguinal hernia repair several years ago.    Review of Systems  He denies any numbness, tingling or pain in the leg. He's had no significant genitourinary symptoms except for intermittent dysuria.       Objective:   Physical Exam    He appears healthy , well-nourished &  younger than stated age   He has no lymphadenopathy noted neck, axilla or inguinal area  Abdominal exam is unremarkable without organomegaly, masses or tenderness.  There is suggestion of a direct hernia on the right which is reducible. This is  minimally tender and is not elicit his symptoms. I cannot appreciate an indirect hernia.     Assessment & Plan:   #1 probable tear at the site of the prior hernia repair; probable unrelated small direct hernia on the right as well  Plan: See orders and recommendations

## 2011-12-30 ENCOUNTER — Encounter (INDEPENDENT_AMBULATORY_CARE_PROVIDER_SITE_OTHER): Payer: Self-pay | Admitting: General Surgery

## 2012-01-05 ENCOUNTER — Other Ambulatory Visit: Payer: Self-pay | Admitting: Internal Medicine

## 2012-02-03 ENCOUNTER — Other Ambulatory Visit: Payer: Self-pay | Admitting: Internal Medicine

## 2012-02-05 DIAGNOSIS — R972 Elevated prostate specific antigen [PSA]: Secondary | ICD-10-CM | POA: Diagnosis not present

## 2012-02-11 DIAGNOSIS — R972 Elevated prostate specific antigen [PSA]: Secondary | ICD-10-CM | POA: Diagnosis not present

## 2012-02-11 DIAGNOSIS — N401 Enlarged prostate with lower urinary tract symptoms: Secondary | ICD-10-CM | POA: Diagnosis not present

## 2012-04-22 ENCOUNTER — Ambulatory Visit (INDEPENDENT_AMBULATORY_CARE_PROVIDER_SITE_OTHER): Payer: Medicare Other | Admitting: Internal Medicine

## 2012-04-22 ENCOUNTER — Encounter: Payer: Self-pay | Admitting: Internal Medicine

## 2012-04-22 VITALS — BP 118/70 | HR 72 | Temp 98.7°F | Wt 164.8 lb

## 2012-04-22 DIAGNOSIS — L0231 Cutaneous abscess of buttock: Secondary | ICD-10-CM

## 2012-04-22 DIAGNOSIS — L03317 Cellulitis of buttock: Secondary | ICD-10-CM

## 2012-04-22 DIAGNOSIS — Z23 Encounter for immunization: Secondary | ICD-10-CM

## 2012-04-22 MED ORDER — CEPHALEXIN 500 MG PO CAPS: 500.0000 mg | ORAL_CAPSULE | Freq: Four times a day (QID) | ORAL | Status: AC

## 2012-04-22 NOTE — Patient Instructions (Addendum)
You can make your own saline .Boil cup of salt in a gallon of water. Store mixture  in a clean container.Report Warning  signs as discussed (red streaks,ulceration, pus, fever, increasing pain).

## 2012-04-22 NOTE — Progress Notes (Signed)
  Subjective:    Patient ID: Andrew Foster, male    DOB: Jan 02, 1941, 71 y.o.   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 He felt something crawling on his back but could not identify the vector.  This morning he was awakened approximately 5 AM with burning pain in his buttock. He he noted a red raised lesion on the buttock which was rough to touch.  And he is unsure as to the status of his tetanus    Review of Systems He denies fever, chills, sweats, abdominal pain, nausea or vomiting.     Objective:   Physical Exam  He appears healthy and well-nourished in no distress  Has a regular rhythm without murmur or gallop. The first heart sound is accentuated.  Chest is clear with no increased work of breathing  Abdomen is soft with normal bowel sounds. No organomegaly or tenderness  He has no lymphadenopathy about the neck or axilla  There is an erythematous raised area over the right buttock measuring 9 x 9.5 cm .There is faint ecchymosis measuring 4 x 5 cm in the lower left area of the erythema. There is no definite site of entry; no ulcers present        Assessment & Plan:  #1 cellulitis secondary to unknown vector right buttock. Certainly spider bite is  of concern but he is not having usual symptoms associated with a venomous spider bite. Antibiotics for a been a spider would not be indicated but he has a secondary cellulitis for which cephalexin will be prescribed. If ulceration occurs; referral to the wound center will be needed  Plan: See orders and recommendations

## 2012-05-05 ENCOUNTER — Other Ambulatory Visit: Payer: Self-pay | Admitting: Internal Medicine

## 2012-08-06 ENCOUNTER — Other Ambulatory Visit: Payer: Self-pay | Admitting: Internal Medicine

## 2012-08-11 DIAGNOSIS — R972 Elevated prostate specific antigen [PSA]: Secondary | ICD-10-CM | POA: Diagnosis not present

## 2012-08-18 DIAGNOSIS — R972 Elevated prostate specific antigen [PSA]: Secondary | ICD-10-CM | POA: Diagnosis not present

## 2012-08-18 DIAGNOSIS — N401 Enlarged prostate with lower urinary tract symptoms: Secondary | ICD-10-CM | POA: Diagnosis not present

## 2012-08-27 DIAGNOSIS — Z23 Encounter for immunization: Secondary | ICD-10-CM | POA: Diagnosis not present

## 2012-11-04 ENCOUNTER — Other Ambulatory Visit: Payer: Self-pay | Admitting: Internal Medicine

## 2012-11-08 NOTE — Telephone Encounter (Signed)
Phone lines out, I will have to call patient to have him stop by the office to sign controlled substance contract

## 2012-11-08 NOTE — Telephone Encounter (Signed)
Left message on VM informing patient to return call when available, reason for call (not left on VM)

## 2012-11-09 NOTE — Telephone Encounter (Signed)
Left message on VM for patient to return call when available  

## 2012-11-09 NOTE — Telephone Encounter (Signed)
Left message on VM informing patient that he will need to stop by the office to sign contract and pick-up rx

## 2012-11-12 DIAGNOSIS — Z79899 Other long term (current) drug therapy: Secondary | ICD-10-CM | POA: Diagnosis not present

## 2012-12-07 ENCOUNTER — Encounter: Payer: Self-pay | Admitting: Internal Medicine

## 2013-01-12 DIAGNOSIS — H2510 Age-related nuclear cataract, unspecified eye: Secondary | ICD-10-CM | POA: Diagnosis not present

## 2013-02-03 DIAGNOSIS — H5210 Myopia, unspecified eye: Secondary | ICD-10-CM | POA: Diagnosis not present

## 2013-02-03 DIAGNOSIS — H35379 Puckering of macula, unspecified eye: Secondary | ICD-10-CM | POA: Diagnosis not present

## 2013-02-03 DIAGNOSIS — H52229 Regular astigmatism, unspecified eye: Secondary | ICD-10-CM | POA: Diagnosis not present

## 2013-02-03 DIAGNOSIS — H5200 Hypermetropia, unspecified eye: Secondary | ICD-10-CM | POA: Diagnosis not present

## 2013-02-03 DIAGNOSIS — H2510 Age-related nuclear cataract, unspecified eye: Secondary | ICD-10-CM | POA: Diagnosis not present

## 2013-02-09 DIAGNOSIS — R972 Elevated prostate specific antigen [PSA]: Secondary | ICD-10-CM | POA: Diagnosis not present

## 2013-02-12 ENCOUNTER — Other Ambulatory Visit: Payer: Self-pay | Admitting: Internal Medicine

## 2013-02-16 DIAGNOSIS — R972 Elevated prostate specific antigen [PSA]: Secondary | ICD-10-CM | POA: Diagnosis not present

## 2013-02-16 DIAGNOSIS — N401 Enlarged prostate with lower urinary tract symptoms: Secondary | ICD-10-CM | POA: Diagnosis not present

## 2013-02-25 ENCOUNTER — Other Ambulatory Visit: Payer: Self-pay | Admitting: Internal Medicine

## 2013-02-25 NOTE — Telephone Encounter (Signed)
Refill done. Left voicemail for patient to call office to schedule office visit .

## 2013-02-28 ENCOUNTER — Ambulatory Visit (INDEPENDENT_AMBULATORY_CARE_PROVIDER_SITE_OTHER): Payer: Medicare Other | Admitting: Internal Medicine

## 2013-02-28 ENCOUNTER — Encounter: Payer: Self-pay | Admitting: Internal Medicine

## 2013-02-28 VITALS — BP 122/70 | HR 70 | Ht 68.0 in | Wt 176.0 lb

## 2013-02-28 DIAGNOSIS — R7309 Other abnormal glucose: Secondary | ICD-10-CM

## 2013-02-28 DIAGNOSIS — Z0000 Encounter for general adult medical examination without abnormal findings: Secondary | ICD-10-CM

## 2013-02-28 DIAGNOSIS — K5730 Diverticulosis of large intestine without perforation or abscess without bleeding: Secondary | ICD-10-CM | POA: Diagnosis not present

## 2013-02-28 DIAGNOSIS — E782 Mixed hyperlipidemia: Secondary | ICD-10-CM | POA: Diagnosis not present

## 2013-02-28 DIAGNOSIS — R9431 Abnormal electrocardiogram [ECG] [EKG]: Secondary | ICD-10-CM

## 2013-02-28 NOTE — Patient Instructions (Addendum)
Preventive Health Care: Eat a low-fat diet with lots of fruits and vegetables, up to 7-9 servings per day. This would eliminate the need for vitamin supplements. Consume less than 40 grams of sugar (preferably ZERO) per day from foods & drinks with High Fructose Corn Sugar as #1,2,3 or # 4 on label. Health Care Power of Attorney & Living Will. Complete these if not in place ; these place you in charge of your health care decisions. Take the EKG to any emergency room or preop visits. There are nonspecific changes; as long as there is no new change these are not clinically significant . If the old EKG is not available for comparison; it may result in unnecessary hospitalization for observation with significant unnecessary expense.

## 2013-02-28 NOTE — Progress Notes (Signed)
Subjective:    Patient ID: Andrew Foster, male    DOB: [birth year], 72 y.o.   MRN: [id number]  HPI Medicare Wellness Visit:  Psychosocial & medical history were reviewed as required by Medicare (abuse,antisocial behavioral risks,firearm risk).  Social history: caffeine:6 cups coffee/day  , alcohol: no  ,  tobacco use:  Quit 1976  Exercise : very physically active  Home & personal  safety / fall risk:no Limitations of activities of daily living:no Seatbelt  and smoke alarm use:yes Power of Attorney/Living Will status : needed Ophthalmology exam status :current Hearing evaluation status: not current Orientation :oriented X 3 Memory & recall :good Math testing: good Active depression / anxiety:denied Foreign travel history : never Immunization status for Shingles /Flu/ PNA/ tetanus : Transfusion history:  no Preventive health surveillance status of colonoscopy as per protocol/ SOC:? current Dental care:  Every 12  mos Chart reviewed &  Updated. Active issues reviewed & addressed.      Review of Systems  He has a past history of fasting hyperglycemia. He denies polyphagia, polyuria, or polydipsia. He has no numbness or tingling in his extremities. He has no nonhealing lesions.There is a history of diabetes and a brother and sister. He continues to have right shoulder impingement symptoms. He's had an interarticular steroid injection at the right shoulder. The chronic pain is presumably related to years of repetitive motion involving the shoulder. Tramadol is of some benefit. He used a narcotic medication with even better relief; but he does have a history of prostatic hypertrophy. His colonoscopy may not be current. He denies abdominal pain, weight loss, melena, or rectal bleeding.    Objective:   Physical Exam  Gen.: Healthy and well-nourished in appearance. Alert, appropriate and cooperative throughout exam. Appears younger than stated age  Head: Normocephalic without obvious  abnormalities  Eyes: No corneal or conjunctival inflammation noted. Extraocular motion intact. Vision grossly normal with lenses Ears: External  ear exam reveals no significant lesions or deformities. Canals clear .TMs normal. Hearing is grossly normal bilaterally. Nose: External nasal exam reveals no deformity or inflammation. Nasal mucosa are pink and moist. No lesions or exudates noted. Septum to L Mouth: Oral mucosa and oropharynx reveal no lesions or exudates. Denture upper;lower implants. Neck: No deformities, masses, or tenderness noted. Range of motion & Thyroid normal. Lungs: Normal respiratory effort; chest expands symmetrically. Lungs are clear to auscultation without rales, wheezes, or increased work of breathing. Heart: Normal rate and rhythm. Accentuated S1 ; normal S2. No gallop, click, or rub. No murmur. Abdomen: Bowel sounds normal; abdomen soft and nontender. No masses, organomegaly or hernias noted. Genitalia: As per Dr Brunilda Payor                                 Musculoskeletal/extremities: No deformity or scoliosis noted of  the thoracic or lumbar spine.  No clubbing, cyanosis, or edema. Range of motion decreased R shoulder .Tone & strength  Normal. Joints  reveal mild mixed DJD DIP changes. Nail health good. Able to lie down & sit up w/o help. Negative SLR bilaterally Vascular: Carotid, radial artery,  and  posterior tibial pulses are full and equal. DPP decreased.No bruits present. Neurologic: Alert and oriented x3. Deep tendon reflexes symmetrical and normal.      Skin: Intact without suspicious lesions or rashes. Large lipoma R upper back Lymph: No cervical, axillary lymphadenopathy present. Psych: Mood and affect are normal. Normally interactive  Assessment & Plan:  #1 Medicare Wellness Exam; criteria met ; data entered #2 Problem List/Diagnoses reviewed Plan:  Assessments made/ Orders  entered

## 2013-03-01 LAB — BASIC METABOLIC PANEL
BUN: 18 mg/dL (ref 6–23)
CO2: 28 mEq/L (ref 19–32)
Chloride: 101 mEq/L (ref 96–112)
Creatinine, Ser: 0.7 mg/dL (ref 0.4–1.5)
Glucose, Bld: 85 mg/dL (ref 70–99)

## 2013-03-01 LAB — CBC WITH DIFFERENTIAL/PLATELET
Basophils Relative: 0.3 % (ref 0.0–3.0)
Eosinophils Absolute: 0.3 10*3/uL (ref 0.0–0.7)
MCHC: 33.7 g/dL (ref 30.0–36.0)
MCV: 98.2 fl (ref 78.0–100.0)
Monocytes Absolute: 0.5 10*3/uL (ref 0.1–1.0)
Neutrophils Relative %: 51.7 % (ref 43.0–77.0)
Platelets: 205 10*3/uL (ref 150.0–400.0)

## 2013-03-01 LAB — HEMOGLOBIN A1C: Hgb A1c MFr Bld: 6.4 % (ref 4.6–6.5)

## 2013-03-01 LAB — LIPID PANEL
Cholesterol: 205 mg/dL — ABNORMAL HIGH (ref 0–200)
Total CHOL/HDL Ratio: 5

## 2013-03-01 LAB — HEPATIC FUNCTION PANEL
AST: 20 U/L (ref 0–37)
Albumin: 4.3 g/dL (ref 3.5–5.2)
Alkaline Phosphatase: 52 U/L (ref 39–117)

## 2013-03-02 DIAGNOSIS — Z79899 Other long term (current) drug therapy: Secondary | ICD-10-CM | POA: Diagnosis not present

## 2013-03-16 ENCOUNTER — Telehealth: Payer: Self-pay | Admitting: Internal Medicine

## 2013-03-16 NOTE — Telephone Encounter (Signed)
Patient has a missed call from yesterday. He does not know what it may have been in regards to.

## 2013-03-16 NOTE — Telephone Encounter (Signed)
I did not call this patient, unfortunately I am unable to see where anyone has called patient on yesterday 02/13/13. I left message on VM informing patient the most recent activity of a phone call being place to the patient was on 02/25/13 informing patient he was due for an appointment which he had on 02/28/13. The message he heard yesterday may be an old message.

## 2013-03-16 NOTE — Telephone Encounter (Signed)
Pt states that he received VM but was unable to make out name of who was calling..Please advise if you were calling this Pt.

## 2013-04-06 ENCOUNTER — Other Ambulatory Visit: Payer: Self-pay | Admitting: Internal Medicine

## 2013-04-20 ENCOUNTER — Encounter: Payer: Self-pay | Admitting: Internal Medicine

## 2013-04-22 ENCOUNTER — Encounter: Payer: Self-pay | Admitting: Family Medicine

## 2013-04-22 ENCOUNTER — Ambulatory Visit (INDEPENDENT_AMBULATORY_CARE_PROVIDER_SITE_OTHER): Payer: Medicare Other | Admitting: Family Medicine

## 2013-04-22 VITALS — BP 122/62 | HR 74 | Temp 98.1°F | Ht 68.5 in | Wt 171.4 lb

## 2013-04-22 DIAGNOSIS — R1011 Right upper quadrant pain: Secondary | ICD-10-CM | POA: Diagnosis not present

## 2013-04-22 LAB — HEPATIC FUNCTION PANEL
ALT: 19 U/L (ref 0–53)
AST: 20 U/L (ref 0–37)
Bilirubin, Direct: 0.1 mg/dL (ref 0.0–0.3)
Total Protein: 7.2 g/dL (ref 6.0–8.3)

## 2013-04-22 LAB — CBC WITH DIFFERENTIAL/PLATELET
Basophils Absolute: 0.1 10*3/uL (ref 0.0–0.1)
Basophils Relative: 0.7 % (ref 0.0–3.0)
Hemoglobin: 14.5 g/dL (ref 13.0–17.0)
Lymphocytes Relative: 42.4 % (ref 12.0–46.0)
Monocytes Relative: 6.6 % (ref 3.0–12.0)
Neutro Abs: 3.6 10*3/uL (ref 1.4–7.7)
RBC: 4.48 Mil/uL (ref 4.22–5.81)
RDW: 12.5 % (ref 11.5–14.6)

## 2013-04-22 LAB — BASIC METABOLIC PANEL
CO2: 25 mEq/L (ref 19–32)
Chloride: 102 mEq/L (ref 96–112)
Creatinine, Ser: 0.6 mg/dL (ref 0.4–1.5)

## 2013-04-22 NOTE — Patient Instructions (Addendum)
Your urine is clear today but please call Dr Brunilda Payor if your symptoms change or worsen We'll notify you of your lab results Alternate Tramadol and Tylenol for pain- this could be a pulled abdominal muscle Heating pad as needed Call with any questions or concerns- particularly if your pain worsens Hang in there!!

## 2013-04-22 NOTE — Progress Notes (Signed)
  Subjective:    Patient ID: Andrew Foster, male    DOB: Sep 08, 1940, 72 y.o.   MRN: [id number]  HPI 'i think i have another kidney stone'- pain is similar in location to previous.  No blood in urine.  Some dysuria.  Pain started 2 days ago.  Painful to move, 'i feel every bump on the highway'.  Pain is located RUQ under 'bottom rib'.  No N/V.  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   Review of Systems For ROS see HPI     Objective:   Physical Exam  Vitals reviewed. Constitutional: He appears well-developed and well-nourished. No distress.  Abdominal: Soft. Bowel sounds are normal. He exhibits no distension. There is no tenderness (no CVA tenderness, no suprapubic tenderness, no RUQ TTP). There is no rebound and no guarding.  Musculoskeletal:  + abdominal pain w/ rotation  Skin: Skin is warm and dry.  Psychiatric: He has a normal mood and affect. His behavior is normal.          Assessment & Plan:

## 2013-04-25 ENCOUNTER — Encounter (HOSPITAL_COMMUNITY): Payer: Self-pay | Admitting: *Deleted

## 2013-04-25 ENCOUNTER — Emergency Department (HOSPITAL_COMMUNITY)
Admission: EM | Admit: 2013-04-25 | Discharge: 2013-04-25 | Disposition: A | Discharge status: Home / Self Care | Payer: Medicare Other | Attending: Emergency Medicine | Admitting: Emergency Medicine

## 2013-04-25 ENCOUNTER — Telehealth: Payer: Self-pay | Admitting: Internal Medicine

## 2013-04-25 DIAGNOSIS — N400 Enlarged prostate without lower urinary tract symptoms: Secondary | ICD-10-CM | POA: Diagnosis not present

## 2013-04-25 DIAGNOSIS — Y9389 Activity, other specified: Secondary | ICD-10-CM | POA: Insufficient documentation

## 2013-04-25 DIAGNOSIS — Z79899 Other long term (current) drug therapy: Secondary | ICD-10-CM | POA: Diagnosis not present

## 2013-04-25 DIAGNOSIS — M549 Dorsalgia, unspecified: Secondary | ICD-10-CM | POA: Insufficient documentation

## 2013-04-25 DIAGNOSIS — Z87891 Personal history of nicotine dependence: Secondary | ICD-10-CM | POA: Insufficient documentation

## 2013-04-25 DIAGNOSIS — E785 Hyperlipidemia, unspecified: Secondary | ICD-10-CM | POA: Diagnosis not present

## 2013-04-25 DIAGNOSIS — IMO0002 Reserved for concepts with insufficient information to code with codable children: Secondary | ICD-10-CM | POA: Insufficient documentation

## 2013-04-25 DIAGNOSIS — T148XXA Other injury of unspecified body region, initial encounter: Secondary | ICD-10-CM

## 2013-04-25 DIAGNOSIS — Z8719 Personal history of other diseases of the digestive system: Secondary | ICD-10-CM | POA: Diagnosis not present

## 2013-04-25 DIAGNOSIS — Y92009 Unspecified place in unspecified non-institutional (private) residence as the place of occurrence of the external cause: Secondary | ICD-10-CM | POA: Insufficient documentation

## 2013-04-25 DIAGNOSIS — X500XXA Overexertion from strenuous movement or load, initial encounter: Secondary | ICD-10-CM | POA: Insufficient documentation

## 2013-04-25 LAB — CBC WITH DIFFERENTIAL/PLATELET
Basophils Absolute: 0.1 10*3/uL (ref 0.0–0.1)
Basophils Relative: 1 % (ref 0–1)
Eosinophils Absolute: 0.1 10*3/uL (ref 0.0–0.7)
MCH: 34.3 pg — ABNORMAL HIGH (ref 26.0–34.0)
MCHC: 36.1 g/dL — ABNORMAL HIGH (ref 30.0–36.0)
Neutrophils Relative %: 61 % (ref 43–77)
Platelets: 256 10*3/uL (ref 150–400)
RBC: 4.63 MIL/uL (ref 4.22–5.81)
RDW: 12.6 % (ref 11.5–15.5)

## 2013-04-25 LAB — COMPREHENSIVE METABOLIC PANEL
ALT: 16 U/L (ref 0–53)
Albumin: 3.8 g/dL (ref 3.5–5.2)
Alkaline Phosphatase: 67 U/L (ref 39–117)
Calcium: 9.5 mg/dL (ref 8.4–10.5)
Potassium: 3.9 mEq/L (ref 3.5–5.1)
Sodium: 138 mEq/L (ref 135–145)
Total Protein: 7.2 g/dL (ref 6.0–8.3)

## 2013-04-25 LAB — LIPASE, BLOOD: Lipase: 38 U/L (ref 11–59)

## 2013-04-25 MED ORDER — HYDROCODONE-ACETAMINOPHEN 5-325 MG PO TABS: 1.0000 | ORAL_TABLET | Freq: Three times a day (TID) | ORAL | Status: DC | PRN

## 2013-04-25 MED ORDER — HYDROMORPHONE HCL PF 1 MG/ML IJ SOLN
1.0000 mg | Freq: Once | INTRAMUSCULAR | Status: AC
  Administered 2013-04-25: 1 mg via INTRAVENOUS
  Filled 2013-04-25: qty 1

## 2013-04-25 MED ORDER — IBUPROFEN 400 MG PO TABS
600.0000 mg | ORAL_TABLET | Freq: Once | ORAL | Status: AC
  Administered 2013-04-25: 600 mg via ORAL
  Filled 2013-04-25: qty 1

## 2013-04-25 MED ORDER — DIAZEPAM 2 MG PO TABS
2.0000 mg | ORAL_TABLET | Freq: Once | ORAL | Status: AC
  Administered 2013-04-25: 2 mg via ORAL
  Filled 2013-04-25: qty 1

## 2013-04-25 MED ORDER — IBUPROFEN 600 MG PO TABS: 600.0000 mg | ORAL_TABLET | Freq: Three times a day (TID) | ORAL | Status: DC | PRN

## 2013-04-25 MED ORDER — METHOCARBAMOL 750 MG PO TABS: 750.0000 mg | ORAL_TABLET | Freq: Four times a day (QID) | ORAL | Status: DC

## 2013-04-25 MED ORDER — CYCLOBENZAPRINE HCL 5 MG PO TABS: 5.0000 mg | ORAL_TABLET | Freq: Three times a day (TID) | ORAL | Status: DC | PRN

## 2013-04-25 NOTE — ED Provider Notes (Signed)
I performed a history and physical examination of  Andrew Foster and discussed his manaement with Dr. Mikey Bussing. I agree with the history, physical, assessment, and plan of care, with the following exceptions: None I was present for the following procedures: None  Time Spent in Critical Care of the patient: None  Time spent in discussions with the patient and family: 15 minutes  Pt has clearly reproducible abd wall pain, which is worse with palpation, and with movement of the leg and with turning his torso. Suspect muscular strain. No clinical concerns for hernia issues, as no masses appreciated. No concerns for renal stones either. Conservative management, with pcp f/u in 4 days recommended.  Derwood Kaplan   Derwood Kaplan, MD 04/25/13 [phone]

## 2013-04-25 NOTE — Telephone Encounter (Signed)
Spoke with [facility], spoke with Aundra Millet the triage nurse was advised that the pt normally sees Dr. Lindie Spruce (no openings until 05-19-2013) Was advised to tell pt that due to severity of pain (10) and the clean labs that he needed to be evaluated at [facility] or [facility] asap. Pt notified and stated he would be seeking treatment at [facility]

## 2013-04-25 NOTE — Telephone Encounter (Signed)
Patient is calling complaining about the pain he still has in his side. Wants to know if he can be sent for an x-ray. Says that he had had a hernia repair on the same side his pain is originating from and wants to know if it could be infected. Please advise.

## 2013-04-25 NOTE — ED Provider Notes (Signed)
CSN: [id number]     Arrival date & time 04/25/13  1316 History   First MD Initiated Contact with Patient 04/25/13 1744     Chief Complaint  Patient presents with  . Abdominal Pain   (Consider location/radiation/quality/duration/timing/severity/associated sxs/prior Treatment) Patient is a 72 y.o. male presenting with abdominal pain.  Abdominal Pain Associated symptoms: no chest pain, no constipation, no cough, no diarrhea, no dysuria, no fatigue, no fever, no hematuria, no nausea, no shortness of breath and no vomiting    Andrew Foster is a 72 year old male with a PMH of Diverticulosis, BPH, and HLD.  He was sent to the ED by his PCP after being unable to get an appointment with Dr. Lindie Spruce at [facility].  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  He was seen by his PCP the next day where he told them that it felt like a kidney stone that he has had before.  An U/A as well CBC and CMP were obtained which were all normal.  He reports that he has been taking Tramadol which he was prescribed for shoulder pain and tylenol for his pain and it seems to have gotten worse.  He reports his pain is positional with certain actions.  It has made it difficult for him to get into and out of chairs and also to move his right leg.  Past Medical History  Diagnosis Date  . Diverticulosis   . BPH (benign prostatic hyperplasia)    Past Surgical History  Procedure Laterality Date  . Hernia repair    . Carpal tunnel release    . Colonoscopy      Tics; [facility]  . Prostate biopsy      Dr Brunilda Payor  . Intra articular steroids      R shoulder  . Vasectomy     Family History  Problem Relation Age of Onset  . Heart attack Mother 37  . Diabetes Sister   . Diabetes Brother   . Stroke Neg Hx   . Cancer Neg Hx    History  Substance Use Topics  . Smoking status: Former Smoker    Quit date:  09/01/1974  . Smokeless tobacco: Not on file     Comment: smoked 1956-1976, up to 3 ppd  . Alcohol Use: No    Review of Systems  Constitutional: Negative for fever, appetite change and fatigue.  HENT: Negative for congestion, rhinorrhea and neck pain.   Respiratory: Negative for cough, chest tightness, shortness of breath and wheezing.   Cardiovascular: Negative for chest pain, palpitations and leg swelling.  Gastrointestinal: Positive for abdominal pain. Negative for nausea, vomiting, diarrhea and constipation.  Endocrine: Negative for polydipsia and polyuria.  Genitourinary: Positive for flank pain. Negative for dysuria, frequency, hematuria, difficulty urinating and testicular pain.  Musculoskeletal: Positive for back pain. Negative for joint swelling, arthralgias and gait problem.  Skin: Negative for wound.  Neurological: Negative for dizziness, weakness, light-headedness, numbness and headaches.  Psychiatric/Behavioral: Negative for agitation.    Allergies  Review of patient's allergies indicates no known allergies.  Home Medications   Current Outpatient Rx  Name  Route  Sig  Dispense  Refill  . meclizine (ANTIVERT) 12.5 MG tablet   Oral   Take 1 tablet (12.5 mg total) by mouth 3 (three) times daily as needed. 1-2 by mouth every  6-8 hours as needed  For vertigo   30 tablet   1   . Multiple Vitamin (MULTIVITAMIN) tablet   Oral   Take 1 tablet by mouth daily.           Marland Kitchen omeprazole (PRILOSEC OTC) 20 MG tablet   Oral   Take 20 mg by mouth daily.           . tamsulosin (FLOMAX) 0.4 MG CAPS capsule      TAKE ONE CAPSULE BY MOUTH ONCE DAILY   90 capsule   2   . traMADol (ULTRAM) 50 MG tablet      TAKE ONE TABLET BY MOUTH AT BEDTIME AS NEEDED FOR PAIN   90 tablet   0    BP 132/73  Pulse 69  Temp(Src) 98.3 F (36.8 C) (Oral)  Resp 18  SpO2 98% Physical Exam  Nursing note and vitals reviewed. Constitutional: He is oriented to person, place, and time. He  appears well-developed and well-nourished. No distress.  HENT:  Head: Normocephalic and atraumatic.  Eyes: Conjunctivae are normal.  Cardiovascular: Normal rate, regular rhythm, normal heart sounds and intact distal pulses.   No murmur heard. Pulmonary/Chest: Effort normal and breath sounds normal. No respiratory distress. He has no wheezes. He has no rales.  Abdominal: Soft. Bowel sounds are normal. He exhibits no distension. There is no tenderness. There is no rebound and no guarding.  Musculoskeletal: He exhibits no edema.  CVA tenderness on right side.  Right flank TTP.  Neurological: He is alert and oriented to person, place, and time.  Skin: Skin is warm and dry. He is not diaphoretic.  Psychiatric: He has a normal mood and affect.    ED Course  Procedures (including critical care time) Labs Review Labs Reviewed  CBC WITH DIFFERENTIAL - Abnormal; Notable for the following:    MCH 34.3 (*)    MCHC 36.1 (*)    All other components within normal limits  COMPREHENSIVE METABOLIC PANEL - Abnormal; Notable for the following:    Glucose, Bld 149 (*)    All other components within normal limits  LIPASE, BLOOD   Imaging Review No results found.  MDM   1. Muscle strain    72 year old male who presents with right flank pain x 5 days, that is positional.  Denies any associated symptoms.  Patient has previous been worked up for kidney stone by his PCP.  Patient was sent to ED by PCP's office because he was in 10/10 pain and unable to get appointment with surgeon who did inguinal hernia repair 6 years ago. CBC, CMP, and lipase here are all wnl.  Hepatobiliary disease seems unlikely and with previous U/A negative for hematuria kidney stone seems unlikely. Given patient's history of physical exam of muscular tenderness and positional pain this is likely a muscle strain. Will give Dilaudid, Ibuprofen, and Valium.  Patient has friend present who agrees to drive patient home on  discharge. Patient was given prescriptions for Vicodin, Flexeril, and Ibuprofen.  Patient instructed to follow up with PCP in 1-2 weeks.    Carlynn Purl, DO 04/25/13 2020

## 2013-04-25 NOTE — Telephone Encounter (Signed)
Caller: Owyn/Patient; Phone: [phone]; Reason for Call: Please call pt at your earliest convenience.  He needs name of surgeon who did hernia surgery, having pain in that area and would like to call surgeon for appt.

## 2013-04-25 NOTE — Telephone Encounter (Signed)
See telephone encounter.  Spoke with the pt and he is at the ER waiting to have CT scan done.//AB/CMA

## 2013-04-25 NOTE — Assessment & Plan Note (Signed)
New.  No evidence of kidney stones in UA.  Check labs to r/o biliary causes.  Suspect abdominal muscle strain given lack of TTP over RUQ.  Reviewed supportive care and red flags that should prompt return.  Pt expressed understanding and is in agreement w/ plan.

## 2013-04-25 NOTE — ED Notes (Signed)
Pt to ED c/o RLQ abd pain that radiates to back.  Pain started while he was kneeling down.  Pt was seen at [facility].  Urine was clear with no blood.  [place] called [facility] who thinks it may be a hernia that he had repaired 6 weeks prior.

## 2013-06-01 ENCOUNTER — Telehealth: Payer: Self-pay | Admitting: *Deleted

## 2013-06-01 NOTE — Telephone Encounter (Signed)
traMADol (ULTRAM) 50 MG tablet Last OV: 02/28/2013 Last Refill: 04/25/2013 Last UDS: 02/28/2013      Moderate Risk

## 2013-06-01 NOTE — Telephone Encounter (Signed)
OK X1 

## 2013-06-03 ENCOUNTER — Other Ambulatory Visit: Payer: Self-pay | Admitting: *Deleted

## 2013-06-03 MED ORDER — TRAMADOL HCL 50 MG PO TABS: 50.0000 mg | ORAL_TABLET | Freq: Every morning | ORAL | Status: DC

## 2013-06-03 NOTE — Telephone Encounter (Signed)
Med refilled.

## 2013-06-07 DIAGNOSIS — Z23 Encounter for immunization: Secondary | ICD-10-CM | POA: Diagnosis not present

## 2013-07-08 ENCOUNTER — Telehealth: Payer: Self-pay | Admitting: *Deleted

## 2013-07-08 ENCOUNTER — Other Ambulatory Visit: Payer: Self-pay | Admitting: *Deleted

## 2013-07-08 MED ORDER — TRAMADOL HCL 50 MG PO TABS: 50.0000 mg | ORAL_TABLET | Freq: Every morning | ORAL | Status: DC

## 2013-07-08 NOTE — Telephone Encounter (Signed)
Tramadol refilled.

## 2013-07-08 NOTE — Telephone Encounter (Signed)
traMADol (ULTRAM) 50 MG tablet Last refill: 06/03/2013 Last OV: 04/22/2013 UDS up-to-date; low risk

## 2013-07-08 NOTE — Telephone Encounter (Signed)
OK X1 

## 2013-08-10 ENCOUNTER — Other Ambulatory Visit: Payer: Self-pay | Admitting: Internal Medicine

## 2013-08-10 NOTE — Telephone Encounter (Signed)
OK X1 

## 2013-08-10 NOTE — Telephone Encounter (Signed)
traMADol (ULTRAM) 50 MG tablet Last refill: 11.7.14 #30, 0 refills Last OV: 6.30.14 UDS up-to-date, low risk

## 2013-08-11 DIAGNOSIS — R972 Elevated prostate specific antigen [PSA]: Secondary | ICD-10-CM | POA: Diagnosis not present

## 2013-08-18 DIAGNOSIS — N139 Obstructive and reflux uropathy, unspecified: Secondary | ICD-10-CM | POA: Diagnosis not present

## 2013-08-18 DIAGNOSIS — N529 Male erectile dysfunction, unspecified: Secondary | ICD-10-CM | POA: Diagnosis not present

## 2013-08-18 DIAGNOSIS — N401 Enlarged prostate with lower urinary tract symptoms: Secondary | ICD-10-CM | POA: Diagnosis not present

## 2013-08-18 DIAGNOSIS — R972 Elevated prostate specific antigen [PSA]: Secondary | ICD-10-CM | POA: Diagnosis not present

## 2013-09-08 DIAGNOSIS — M19019 Primary osteoarthritis, unspecified shoulder: Secondary | ICD-10-CM | POA: Diagnosis not present

## 2013-09-08 DIAGNOSIS — M25819 Other specified joint disorders, unspecified shoulder: Secondary | ICD-10-CM | POA: Diagnosis not present

## 2013-09-19 ENCOUNTER — Other Ambulatory Visit: Payer: Self-pay | Admitting: *Deleted

## 2013-09-19 ENCOUNTER — Telehealth: Payer: Self-pay | Admitting: *Deleted

## 2013-09-19 MED ORDER — TRAMADOL HCL 50 MG PO TABS: ORAL_TABLET | ORAL | Status: DC

## 2013-09-19 NOTE — Telephone Encounter (Signed)
Script faxed to pharmacy. JG//CMA 

## 2013-09-19 NOTE — Telephone Encounter (Signed)
OK X1 

## 2013-09-19 NOTE — Telephone Encounter (Signed)
Tramadol 50mg  Last refill: 08/10/13 #30, 0 refills Last OV: 04/22/13 Contract on file, low risk

## 2013-09-29 DIAGNOSIS — M25819 Other specified joint disorders, unspecified shoulder: Secondary | ICD-10-CM | POA: Diagnosis not present

## 2013-09-29 DIAGNOSIS — M19019 Primary osteoarthritis, unspecified shoulder: Secondary | ICD-10-CM | POA: Diagnosis not present

## 2013-11-04 ENCOUNTER — Telehealth: Payer: Self-pay | Admitting: *Deleted

## 2013-11-04 MED ORDER — TRAMADOL HCL 50 MG PO TABS: ORAL_TABLET | ORAL | Status: DC

## 2013-11-04 NOTE — Telephone Encounter (Signed)
Requesting Tramadol 50mg  Take 1 tablet by mouth once daily in the morning. Last refill:09-19-13;#30,0 Last OV:04-22-13 UDS:11-10-12-Mod risk Please advise.//AB/CMA

## 2013-11-04 NOTE — Telephone Encounter (Signed)
OK X1 

## 2013-11-04 NOTE — Addendum Note (Signed)
Addended by: Harl Bowie on: 11/04/2013 05:22 PM   Modules accepted: Orders

## 2013-11-04 NOTE — Telephone Encounter (Signed)
Rx printed and faxed to the pharmacy.//AB/CMA 

## 2013-12-03 ENCOUNTER — Other Ambulatory Visit: Payer: Self-pay | Admitting: Internal Medicine

## 2013-12-05 ENCOUNTER — Other Ambulatory Visit: Payer: Self-pay | Admitting: Internal Medicine

## 2013-12-05 MED ORDER — TRAMADOL HCL 50 MG PO TABS: ORAL_TABLET | ORAL | Status: DC

## 2013-12-05 NOTE — Telephone Encounter (Signed)
Last OV with you 01/2013

## 2013-12-08 MED ORDER — TRAMADOL HCL 50 MG PO TABS: ORAL_TABLET | ORAL | Status: DC

## 2013-12-08 NOTE — Telephone Encounter (Signed)
Rx printed and faxed to the pharmacy.  Pt aware.//AB/CMA 

## 2013-12-08 NOTE — Addendum Note (Signed)
Addended by: Harl Bowie on: 12/08/2013 02:56 PM   Modules accepted: Orders

## 2013-12-21 ENCOUNTER — Ambulatory Visit (INDEPENDENT_AMBULATORY_CARE_PROVIDER_SITE_OTHER): Payer: Medicare Other | Admitting: Internal Medicine

## 2013-12-21 ENCOUNTER — Other Ambulatory Visit (INDEPENDENT_AMBULATORY_CARE_PROVIDER_SITE_OTHER): Payer: Medicare Other

## 2013-12-21 ENCOUNTER — Encounter: Payer: Self-pay | Admitting: Internal Medicine

## 2013-12-21 VITALS — BP 132/84 | HR 58 | Temp 97.9°F | Wt 177.4 lb

## 2013-12-21 DIAGNOSIS — E782 Mixed hyperlipidemia: Secondary | ICD-10-CM

## 2013-12-21 DIAGNOSIS — K219 Gastro-esophageal reflux disease without esophagitis: Secondary | ICD-10-CM

## 2013-12-21 DIAGNOSIS — K5730 Diverticulosis of large intestine without perforation or abscess without bleeding: Secondary | ICD-10-CM | POA: Diagnosis not present

## 2013-12-21 DIAGNOSIS — R7309 Other abnormal glucose: Secondary | ICD-10-CM | POA: Diagnosis not present

## 2013-12-21 LAB — LIPID PANEL
Cholesterol: 200 mg/dL (ref 0–200)
HDL: 41.7 mg/dL (ref >39.00)
LDL CALC: 118 mg/dL — AB (ref 0–99)
Total CHOL/HDL Ratio: 5
Triglycerides: 200 mg/dL — ABNORMAL HIGH (ref 0.0–149.0)
VLDL: 40 mg/dL (ref 0.0–40.0)

## 2013-12-21 LAB — CBC WITH DIFFERENTIAL/PLATELET
BASOS PCT: 0.7 % (ref 0.0–3.0)
Basophils Absolute: 0 10*3/uL (ref 0.0–0.1)
EOS PCT: 2.8 % (ref 0.0–5.0)
Eosinophils Absolute: 0.2 10*3/uL (ref 0.0–0.7)
HEMATOCRIT: 44.7 % (ref 39.0–52.0)
HEMOGLOBIN: 15.5 g/dL (ref 13.0–17.0)
LYMPHS ABS: 2.5 10*3/uL (ref 0.7–4.0)
Lymphocytes Relative: 37 % (ref 12.0–46.0)
MCHC: 34.7 g/dL (ref 30.0–36.0)
MCV: 95.9 fl (ref 78.0–100.0)
MONO ABS: 0.5 10*3/uL (ref 0.1–1.0)
Monocytes Relative: 6.9 % (ref 3.0–12.0)
Neutro Abs: 3.6 10*3/uL (ref 1.4–7.7)
Neutrophils Relative %: 52.6 % (ref 43.0–77.0)
Platelets: 262 10*3/uL (ref 150.0–400.0)
RBC: 4.66 Mil/uL (ref 4.22–5.81)
RDW: 12.6 % (ref 11.5–14.6)
WBC: 6.9 10*3/uL (ref 4.5–10.5)

## 2013-12-21 LAB — HEPATIC FUNCTION PANEL
ALT: 18 U/L (ref 0–53)
AST: 19 U/L (ref 0–37)
Albumin: 4.2 g/dL (ref 3.5–5.2)
Alkaline Phosphatase: 51 U/L (ref 39–117)
BILIRUBIN DIRECT: 0.1 mg/dL (ref 0.0–0.3)
BILIRUBIN TOTAL: 0.9 mg/dL (ref 0.3–1.2)
Total Protein: 7.1 g/dL (ref 6.0–8.3)

## 2013-12-21 LAB — HEMOGLOBIN A1C: HEMOGLOBIN A1C: 6 % (ref 4.6–6.5)

## 2013-12-21 LAB — TSH: TSH: 1.3 u[IU]/mL (ref 0.35–5.50)

## 2013-12-21 MED ORDER — TRAMADOL HCL 50 MG PO TABS: ORAL_TABLET | ORAL | Status: DC

## 2013-12-21 NOTE — Assessment & Plan Note (Addendum)
CBC & dif  Anti reflux measures PPI pre b'fast prn   

## 2013-12-21 NOTE — Progress Notes (Signed)
   Subjective:    Patient ID: Andrew Foster, male    DOB: December 06, 1940, 73 y.o.   MRN: [id number]  HPI  He is here to assess active health issues & conditions.  PMH, FH, & Social history verified & updated A heart healthy diet is followed; no regular exercise.  Family history is positive for premature coronary disease.  Advanced cholesterol testing reveals LDL goal is less than 100 ; ideally < 70 .  There is no statin to date.  Low dose ASA is not taken.   He reports R shoulder rotator cuff requires surgical repair; he has not scheduled surgery to date.  He is taking Tramadol for shoulder pain.   Review of Systems Specifically denied are chest pain, palpitations, dyspnea, or claudication.  Significant abdominal symptoms, memory deficit, or myalgias not present.  Unexplained weight loss, abdominal pain, dysphagia, melena, rectal bleeding, or persistently small caliber stools.  Dyspepsia is managed well with omeprazole 20mg  daily.      Objective:   Physical Exam Physical Exam Gen.: Healthy in appearance. Alert, appropriate and cooperative throughout exam.  Appears younger than stated age. Head: Normocephalic without obvious abnormalities; no alopecia.  Eyes: No corneal or conjunctival inflammation noted. Pupils equal round reactive to light and accommodation. Extraocular motion intact. Vision grossly normal with lenses.  Ears: External ear exam reveals no significant lesions or deformities. Canals clear .TMs normal. Hearing is grossly normal bilaterally.  Nose: External nasal exam reveals no deformity or inflammation. Nasal mucosa are pink and moist. No lesions or exudates noted.  Mouth: Oral mucosa and oropharynx reveal no lesions or exudates. Teeth in good repair.  Neck: No deformities, masses, or tenderness noted. Range of motion WNL. Thyroid palpable.  Lungs: Normal respiratory effort; chest expands symmetrically. Lungs are clear to auscultation without rales, wheezes, or increased  work of breathing.  Heart: Normal rate and rhythm. Normal S1 and S2. No gallop, click, or rub. No murmur.  Abdomen: Bowel sounds normal; abdomen soft and nontender. No masses, organomegaly or hernias noted.  Genitalia: as per Urology.  Musculoskeletal/extremities: No deformity or scoliosis noted of the thoracic or lumbar spine. R > L shoulder ROM limited; unable to raise R arm above shoulder.  No clubbing, cyanosis, edema, or significant extremity deformity noted. Range of motion normal.Tone & strength normal.  Hand joints reveal mild DJD DIP changes.  Fingernail health good; 2 nails with resolving ecchymoses.  Able to lie down & sit up w/o help. Negative SLR bilaterally. Vascular: Carotid, radial artery, dorsalis pedis and posterior tibial pulses are full and equal. No bruits present.  Neurologic: Alert and oriented x3. Deep tendon reflexes symmetrical and normal.  Gait normal.   Skin: Intact without suspicious lesions or rashes.  Lymph: No cervical, axillary lymphadenopathy present.  Psych: Mood and affect are normal. Normally interactive.    Assessment & Plan:  See Current Assessment & Plan in Problem List under specific DiagnosisThe labs will be reviewed and risks and options assessed. Written recommendations will be provided by mail or directly through My Chart.Further evaluation or change in medical therapy will be directed by those results.

## 2013-12-21 NOTE — Assessment & Plan Note (Signed)
A1c , urine microalbumin, BMET 

## 2013-12-21 NOTE — Patient Instructions (Signed)
Your next office appointment will be determined based upon review of your pending labs &/ or x-rays. Those instructions will be transmitted to you through My Chart  OR  by mail;whichever process is your choice to receive results & recommendations . Unfortunately we cannot send results through My Chart and mail results as well.Mailing requires staff involvement in copying & mailing data.With My Chart there is direct physician to patient communication in "real time". With My Chart you should  receive results very quickly, usually in less than 24 hours if you sign up as soon as possible after your visit.This is especially important with acute heath problems associated with significant lab abnormalities. Additionally using this system allows you access to your chart essentially anywhere in the world where there is Internet access. If you will not be using My Chart; that portal will be closed to prevent lab results not being addressed in a timely fashion by BOTH OF [place] , potentially resulting in delay in care. Followup as needed for your acute issue. Please report any significant change in your symptoms. Reflux of gastric acid may be asymptomatic as this may occur mainly during sleep.The triggers for reflux  include stress; the "aspirin family" ; alcohol; peppermint; and caffeine (coffee, tea, cola, and chocolate). The aspirin family would include aspirin and the nonsteroidal agents such as ibuprofen &  Naproxen. Tylenol would not cause reflux. If having symptoms ; food & drink should be avoided for @ least 2 hours before going to bed.

## 2013-12-21 NOTE — Progress Notes (Signed)
Subjective:    Patient ID: Andrew Foster, male    DOB: Sep 11, 1940, 73 y.o.   MRN: [id number]  HPI  He is here to assess active health issues & conditions.  PMH, FH, & Social history verified & updated  A heart healthy diet is followed; no regular exercise.  Family history is positive for premature coronary disease.  Advanced cholesterol testing reveals LDL goal is less than 100 ; ideally < 70 .  There is no statin @ present ; he does not know why it was stopped previously Low dose ASA is not taken.       Review of Systems   Specifically denied are chest pain, palpitations, dyspnea, or claudication.  Significant abdominal symptoms, memory deficit, or myalgias not present.  Unexplained weight loss, abdominal pain, dysphagia, melena, rectal bleeding, or persistently small caliber stools.  Dyspepsia is managed well with omeprazole 20mg  daily.  He reports R shoulder rotator cuff requires surgical repair; he has not scheduled surgery to date.  He is taking Tramadol for shoulder pain.      Objective:   Physical Exam Gen.: Healthy in appearance. Alert, appropriate and cooperative throughout exam.  Appears younger than stated age.  Head: Normocephalic without obvious abnormalities; no alopecia.  Eyes: No corneal or conjunctival inflammation noted. Pupils equal round reactive to light and accommodation. Extraocular motion intact. Vision grossly normal with lenses. Ptosis, OS> OD Ears: External ear exam reveals no significant lesions or deformities. Canals clear .TMs normal. Hearing is grossly normal bilaterally.  Nose: External nasal exam reveals no deformity or inflammation. Nasal mucosa are pink and moist. No lesions or exudates noted.  Mouth: Oral mucosa and oropharynx reveal no lesions or exudates. Upper plate with absent tooth.Lower implant Neck: No deformities, masses, or tenderness noted. Range of motion WNL. Thyroid palpable.  Lungs: Normal respiratory effort; chest expands  symmetrically. Lungs are clear to auscultation without rales, wheezes, or increased work of breathing. Slightly decreased BS Heart: Normal rate and rhythm.Accentuated S1 and S2. No gallop, click, or rub. No murmur.  Abdomen: Bowel sounds normal; abdomen soft and nontender. No masses, organomegaly or hernias noted.  Genitalia: as per Urology.  Musculoskeletal/extremities: No deformity or scoliosis noted of the thoracic or lumbar spine. R > L shoulder ROM limited; unable to raise R arm above shoulder.  No clubbing, cyanosis, edema, or significant extremity deformity noted. Tone & strength normal.  Hand joints reveal mild DJD DIP changes.  Fingernail / toenail health good but post traumatic nail changes LUE.  Able to lie down & sit up w/o help. Negative SLR bilaterally.  Vascular: Carotid, radial artery, dorsalis pedis and posterior tibial pulses are full and equal. No bruits present.  Neurologic: Alert and oriented x3. Deep tendon reflexes symmetrical and normal.  Gait normal.  Skin: Intact without suspicious lesions or rashes.  Lymph: No cervical, axillary lymphadenopathy present.  Psych: Mood and affect are normal. Normally interactive.                                                          Assessment & Plan:  See Current Assessment & Plan in Problem List under specific DiagnosisThe labs will be reviewed and risks and options assessed. Written recommendations will be provided by mail or directly through My Chart.Further evaluation or change in medical  therapy will be directed by those results.

## 2013-12-21 NOTE — Progress Notes (Signed)
Pre visit review using our clinic review tool, if applicable. No additional management support is needed unless otherwise documented below in the visit note. 

## 2013-12-21 NOTE — Assessment & Plan Note (Signed)
Lipids, LFTs, TSH  

## 2014-01-02 ENCOUNTER — Other Ambulatory Visit: Payer: Self-pay | Admitting: Internal Medicine

## 2014-01-03 ENCOUNTER — Other Ambulatory Visit: Payer: Self-pay

## 2014-01-03 MED ORDER — TAMSULOSIN HCL 0.4 MG PO CAPS: 0.4000 mg | ORAL_CAPSULE | Freq: Every day | ORAL | Status: DC

## 2014-01-03 NOTE — Telephone Encounter (Signed)
Last ov with you on 12/21/13 Med last filled -not sure

## 2014-01-03 NOTE — Telephone Encounter (Signed)
OK X1 

## 2014-02-04 ENCOUNTER — Other Ambulatory Visit: Payer: Self-pay | Admitting: Internal Medicine

## 2014-02-04 NOTE — Telephone Encounter (Signed)
OK X1 

## 2014-02-06 MED ORDER — TRAMADOL HCL 50 MG PO TABS: ORAL_TABLET | ORAL | Status: DC

## 2014-02-06 NOTE — Telephone Encounter (Signed)
Faxed script back to [facility].../lmb 

## 2014-02-15 DIAGNOSIS — R972 Elevated prostate specific antigen [PSA]: Secondary | ICD-10-CM | POA: Diagnosis not present

## 2014-02-22 DIAGNOSIS — N401 Enlarged prostate with lower urinary tract symptoms: Secondary | ICD-10-CM | POA: Diagnosis not present

## 2014-02-22 DIAGNOSIS — N529 Male erectile dysfunction, unspecified: Secondary | ICD-10-CM | POA: Diagnosis not present

## 2014-02-22 DIAGNOSIS — N139 Obstructive and reflux uropathy, unspecified: Secondary | ICD-10-CM | POA: Diagnosis not present

## 2014-02-22 DIAGNOSIS — R972 Elevated prostate specific antigen [PSA]: Secondary | ICD-10-CM | POA: Diagnosis not present

## 2014-03-21 ENCOUNTER — Other Ambulatory Visit: Payer: Self-pay | Admitting: Internal Medicine

## 2014-03-21 NOTE — Telephone Encounter (Signed)
OK X 1 but mark as PRN ONLY, not routinely.

## 2014-04-24 ENCOUNTER — Other Ambulatory Visit: Payer: Self-pay | Admitting: Internal Medicine

## 2014-04-24 NOTE — Telephone Encounter (Signed)
OK X1 

## 2014-04-24 NOTE — Telephone Encounter (Signed)
12/21/13 last office visit with you  03/21/14 #30 called to pharmacy no refills

## 2014-05-25 ENCOUNTER — Other Ambulatory Visit: Payer: Self-pay | Admitting: Internal Medicine

## 2014-05-25 NOTE — Telephone Encounter (Signed)
4.22.15 last office note 8.24.15 medication last phoned to pharmacy

## 2014-05-25 NOTE — Telephone Encounter (Signed)
OK #30 

## 2014-06-25 ENCOUNTER — Other Ambulatory Visit: Payer: Self-pay | Admitting: Internal Medicine

## 2014-06-26 ENCOUNTER — Other Ambulatory Visit: Payer: Self-pay | Admitting: Internal Medicine

## 2014-06-26 NOTE — Telephone Encounter (Signed)
Tramadol has been called to [facility] pharmacy  

## 2014-06-26 NOTE — Telephone Encounter (Signed)
OK X1 

## 2014-07-10 ENCOUNTER — Other Ambulatory Visit: Payer: Self-pay | Admitting: Internal Medicine

## 2014-07-25 ENCOUNTER — Other Ambulatory Visit: Payer: Self-pay | Admitting: Internal Medicine

## 2014-07-25 NOTE — Telephone Encounter (Signed)
OK X1 

## 2014-07-25 NOTE — Telephone Encounter (Signed)
12/21/13 last office visit  06/26/14 #30 no refills last phoned to pharmacy

## 2014-07-25 NOTE — Telephone Encounter (Signed)
Tramadol has been called to [facility]  

## 2014-08-01 DIAGNOSIS — M25551 Pain in right hip: Secondary | ICD-10-CM | POA: Diagnosis not present

## 2014-08-01 DIAGNOSIS — M7542 Impingement syndrome of left shoulder: Secondary | ICD-10-CM | POA: Diagnosis not present

## 2014-08-02 DIAGNOSIS — M25551 Pain in right hip: Secondary | ICD-10-CM | POA: Diagnosis not present

## 2014-08-08 DIAGNOSIS — M25551 Pain in right hip: Secondary | ICD-10-CM | POA: Diagnosis not present

## 2014-08-10 DIAGNOSIS — M25551 Pain in right hip: Secondary | ICD-10-CM | POA: Diagnosis not present

## 2014-08-14 DIAGNOSIS — M25511 Pain in right shoulder: Secondary | ICD-10-CM | POA: Diagnosis not present

## 2014-08-17 DIAGNOSIS — M25551 Pain in right hip: Secondary | ICD-10-CM | POA: Diagnosis not present

## 2014-08-21 DIAGNOSIS — M25551 Pain in right hip: Secondary | ICD-10-CM | POA: Diagnosis not present

## 2014-08-21 DIAGNOSIS — R972 Elevated prostate specific antigen [PSA]: Secondary | ICD-10-CM | POA: Diagnosis not present

## 2014-08-24 ENCOUNTER — Other Ambulatory Visit: Payer: Self-pay | Admitting: Internal Medicine

## 2014-08-24 DIAGNOSIS — M25551 Pain in right hip: Secondary | ICD-10-CM | POA: Diagnosis not present

## 2014-08-24 NOTE — Telephone Encounter (Signed)
OK X1 

## 2014-08-24 NOTE — Telephone Encounter (Signed)
Tramadol has been called to [facility]

## 2014-08-28 DIAGNOSIS — N401 Enlarged prostate with lower urinary tract symptoms: Secondary | ICD-10-CM | POA: Diagnosis not present

## 2014-08-28 DIAGNOSIS — R351 Nocturia: Secondary | ICD-10-CM | POA: Diagnosis not present

## 2014-08-29 DIAGNOSIS — M19011 Primary osteoarthritis, right shoulder: Secondary | ICD-10-CM | POA: Diagnosis not present

## 2014-08-29 DIAGNOSIS — M25551 Pain in right hip: Secondary | ICD-10-CM | POA: Diagnosis not present

## 2014-08-31 DIAGNOSIS — M25551 Pain in right hip: Secondary | ICD-10-CM | POA: Diagnosis not present

## 2014-09-25 ENCOUNTER — Other Ambulatory Visit: Payer: Self-pay | Admitting: Internal Medicine

## 2014-09-25 NOTE — Telephone Encounter (Signed)
Tramadol has been called to [facility] in [place]

## 2014-09-25 NOTE — Telephone Encounter (Signed)
OK X1 

## 2014-10-18 ENCOUNTER — Telehealth: Payer: Self-pay

## 2014-10-18 ENCOUNTER — Encounter: Payer: Self-pay | Admitting: Internal Medicine

## 2014-10-18 ENCOUNTER — Ambulatory Visit (INDEPENDENT_AMBULATORY_CARE_PROVIDER_SITE_OTHER): Payer: Medicare Other | Admitting: Internal Medicine

## 2014-10-18 ENCOUNTER — Other Ambulatory Visit (INDEPENDENT_AMBULATORY_CARE_PROVIDER_SITE_OTHER): Payer: Medicare Other

## 2014-10-18 VITALS — BP 120/86 | HR 69 | Temp 97.9°F | Resp 15 | Ht 67.25 in | Wt 170.2 lb

## 2014-10-18 DIAGNOSIS — R6883 Chills (without fever): Secondary | ICD-10-CM

## 2014-10-18 DIAGNOSIS — B009 Herpesviral infection, unspecified: Secondary | ICD-10-CM | POA: Diagnosis not present

## 2014-10-18 DIAGNOSIS — R300 Dysuria: Secondary | ICD-10-CM

## 2014-10-18 LAB — HEPATIC FUNCTION PANEL
ALT: 32 U/L (ref 0–53)
AST: 25 U/L (ref 0–37)
Albumin: 3.6 g/dL (ref 3.5–5.2)
Alkaline Phosphatase: 166 U/L — ABNORMAL HIGH (ref 39–117)
Bilirubin, Direct: 0.2 mg/dL (ref 0.0–0.3)
TOTAL PROTEIN: 7 g/dL (ref 6.0–8.3)
Total Bilirubin: 0.7 mg/dL (ref 0.2–1.2)

## 2014-10-18 LAB — CBC WITH DIFFERENTIAL/PLATELET
BASOS ABS: 0 10*3/uL (ref 0.0–0.1)
Basophils Relative: 0.5 % (ref 0.0–3.0)
EOS PCT: 0.9 % (ref 0.0–5.0)
Eosinophils Absolute: 0.1 10*3/uL (ref 0.0–0.7)
HEMATOCRIT: 39.8 % (ref 39.0–52.0)
Hemoglobin: 13.7 g/dL (ref 13.0–17.0)
LYMPHS ABS: 2.3 10*3/uL (ref 0.7–4.0)
Lymphocytes Relative: 22.1 % (ref 12.0–46.0)
MCHC: 34.5 g/dL (ref 30.0–36.0)
MCV: 93.5 fl (ref 78.0–100.0)
MONOS PCT: 8.5 % (ref 3.0–12.0)
Monocytes Absolute: 0.9 10*3/uL (ref 0.1–1.0)
NEUTROS PCT: 68 % (ref 43.0–77.0)
Neutro Abs: 6.9 10*3/uL (ref 1.4–7.7)
PLATELETS: 295 10*3/uL (ref 150.0–400.0)
RBC: 4.26 Mil/uL (ref 4.22–5.81)
RDW: 13.3 % (ref 11.5–15.5)
WBC: 10.2 10*3/uL (ref 4.0–10.5)

## 2014-10-18 LAB — URINALYSIS, ROUTINE W REFLEX MICROSCOPIC
Bilirubin Urine: NEGATIVE
Hgb urine dipstick: NEGATIVE
KETONES UR: NEGATIVE
NITRITE: POSITIVE — AB
PH: 6 (ref 5.0–8.0)
Urine Glucose: NEGATIVE
Urobilinogen, UA: 0.2 (ref 0.0–1.0)

## 2014-10-18 LAB — BASIC METABOLIC PANEL
BUN: 19 mg/dL (ref 6–23)
CO2: 27 mEq/L (ref 19–32)
CREATININE: 0.66 mg/dL (ref 0.40–1.50)
Calcium: 9.5 mg/dL (ref 8.4–10.5)
Chloride: 103 mEq/L (ref 96–112)
GFR: 125.24 mL/min (ref >60.00)
GLUCOSE: 105 mg/dL — AB (ref 70–99)
POTASSIUM: 3.8 meq/L (ref 3.5–5.1)
Sodium: 138 mEq/L (ref 135–145)

## 2014-10-18 MED ORDER — VALACYCLOVIR HCL 1 G PO TABS: 1000.0000 mg | ORAL_TABLET | Freq: Two times a day (BID) | ORAL | Status: DC

## 2014-10-18 MED ORDER — ACYCLOVIR 5 % EX OINT: 1.0000 "application " | TOPICAL_OINTMENT | CUTANEOUS | Status: DC

## 2014-10-18 NOTE — Telephone Encounter (Signed)
Prior authorization has been denied.

## 2014-10-18 NOTE — Telephone Encounter (Signed)
Received a fax from [facility] in [place] stating prior authorization is required for acyclovir 5% ointment. This form has been completed via cover my meds. Waiting on insurance response.

## 2014-10-18 NOTE — Progress Notes (Signed)
Pre visit review using our clinic review tool, if applicable. No additional management support is needed unless otherwise documented below in the visit note. 

## 2014-10-18 NOTE — Patient Instructions (Signed)
  Your next office appointment will be determined based upon review of your pending labs  Those instructions will be transmitted to you by mail.   Critical values will be called. Followup as needed for any active or acute issue. Please report any significant change in your symptoms. 

## 2014-10-18 NOTE — Telephone Encounter (Signed)
Opened in error

## 2014-10-18 NOTE — Progress Notes (Signed)
   Subjective:    Patient ID: Andrew Foster, male    DOB: Jul 04, 1941, 74 y.o.   MRN: [id number]  HPI  He presents with an acute illness which began 10/13/11 as pain in the chest, upper back,& shoulders. This is superimposed on chronic shoulder pain for which he sees Dr Mayer Camel.  He also had chills and rigor as well as earache. He used 3 covers because of the chilling.  No other URI symptoms.  As of 2/12 the rigor had resolved but he still had some chills. He noted lesions at the corners of his  mouth; he also noted profound weakness and malaise with "low body energy".  The chills resolved as of 2/13 but soreness continued in the back and shoulders with the malaise. The lesions of the mouth progressed to involve the lower lip with swelling. He also noted some dysuria.  He continued to experience dysuria with oliguria. He is on tamsulosin from Dr. Janice Norrie. As of 2/14 he developed a lesion in the right nare.  No associated abdominal pain; but he experienced anorexia and nausea with 8 # loss 2/11-14.  As of 2/15 the sores progressed across entire lip and involved both nasal areas. Blistex helps minimally. The dysuria and oliguria persisted.  As of 2/16 he noted the lower lip to be covered with sores.  As of yesterday earache had resolved but he had a mild headache. This is dysuria also had resolved. He notes the urine had been very dark. He did have scant light-colored stool which has not persisted. He does have GERD and takes omeprazole daily.  He has bilateral shoulder pain greater on the right than the left as well as right sciatica. Dr. Mayer Camel, orthopedist is monitoring this.Dr. Mayer Camel prescribes Mobic for osteoarthritis.  Father died with Hepatitis @ 37; he was an alcoholic . Review of Systems  Frontal headache, facial pain , nasal purulence, sore throat , n or otic discharge denied.  He denies hematuria or pyuria.  Other than the acute episode he denies dysphagia, abdominal pain, melena,  rectal bleeding.     Objective:   Physical Exam  Pertinent or positive findings include: He appears fatigued but not acutely ill. He has an upper dental plate. He has diffuse herpetic simplex lesions over the lips and about the nares externally. An S4 is present. Breath sounds are decreased. He has PIP fusiform enlargement changes with marked stiffness and decreased range of motion. There is marked decreased range of motion of the right shoulder with associated crepitus. Slight tenting is present. Decreased DPP.  General appearance :adequately nourished Eyes: No conjunctival inflammation or scleral icterus is present. Oral exam:  Lips and gums are healthy appearing.There is no oropharyngeal erythema or exudate noted.  Heart:  Normal rate and regular rhythm. S1 and S2 normal without gallop, murmur, click, rub or other extra sounds   Lungs:Chest clear to auscultation; no wheezes, rhonchi,rales ,or rubs present.No increased work of breathing.  Abdomen: bowel sounds normal, soft and non-tender without masses, organomegaly or hernias noted.  No guarding or rebound Vascular : all pulses equal ; no bruits present. Skin:Warm & dry.  Intact without suspicious lesions or rashes ; no jaundice  Lymphatic: No lymphadenopathy is noted about the head, neck, axilla Neuro: Strength, tone & DTRs normal.         Assessment & Plan:  #1 viral illness; R/O hepatitis #2 herpes simplex #3dysuria #4 arthralgias, R/O RA See Orders

## 2014-10-19 ENCOUNTER — Encounter: Payer: Self-pay | Admitting: Internal Medicine

## 2014-10-19 ENCOUNTER — Other Ambulatory Visit: Payer: Self-pay | Admitting: Internal Medicine

## 2014-10-19 ENCOUNTER — Other Ambulatory Visit (INDEPENDENT_AMBULATORY_CARE_PROVIDER_SITE_OTHER): Payer: Medicare Other

## 2014-10-19 DIAGNOSIS — R8290 Unspecified abnormal findings in urine: Secondary | ICD-10-CM

## 2014-10-19 DIAGNOSIS — R8299 Other abnormal findings in urine: Secondary | ICD-10-CM | POA: Diagnosis not present

## 2014-10-19 DIAGNOSIS — R748 Abnormal levels of other serum enzymes: Secondary | ICD-10-CM

## 2014-10-19 DIAGNOSIS — R739 Hyperglycemia, unspecified: Secondary | ICD-10-CM | POA: Diagnosis not present

## 2014-10-19 LAB — HEMOGLOBIN A1C: HEMOGLOBIN A1C: 6.5 % (ref 4.6–6.5)

## 2014-10-19 MED ORDER — CIPROFLOXACIN HCL 500 MG PO TABS: 500.0000 mg | ORAL_TABLET | Freq: Two times a day (BID) | ORAL | Status: DC

## 2014-10-19 NOTE — Progress Notes (Signed)
Request for add on labs a1c and urine culture have been faxed to the lab

## 2014-10-21 LAB — URINE CULTURE

## 2014-10-23 ENCOUNTER — Telehealth: Payer: Self-pay

## 2014-10-23 NOTE — Telephone Encounter (Signed)
Opened in error

## 2014-10-29 ENCOUNTER — Other Ambulatory Visit: Payer: Self-pay | Admitting: Internal Medicine

## 2014-10-30 NOTE — Telephone Encounter (Signed)
Notified pharmacy spoke with Lattie Haw gave md approval.../lmb

## 2014-10-30 NOTE — Telephone Encounter (Signed)
Tramadol has been called to [facility]  

## 2014-10-30 NOTE — Telephone Encounter (Signed)
09/25/14 med last phoned to pharmacy  10/18/14 patient last seen

## 2014-10-30 NOTE — Telephone Encounter (Signed)
OK X1 

## 2014-11-15 ENCOUNTER — Telehealth: Payer: Self-pay

## 2014-11-15 NOTE — Telephone Encounter (Signed)
Pt states he recd flu vaccine in October 2015

## 2014-11-15 NOTE — Telephone Encounter (Signed)
No answer, no way to leave message about flu vaccine

## 2014-11-27 ENCOUNTER — Other Ambulatory Visit: Payer: Self-pay | Admitting: Internal Medicine

## 2014-11-27 NOTE — Telephone Encounter (Signed)
Tramadol has been called to [facility]

## 2014-11-27 NOTE — Telephone Encounter (Signed)
10/30/14 med last phoned to pharmacy  10/18/14 last office visit

## 2014-11-27 NOTE — Telephone Encounter (Signed)
OK X1 

## 2014-12-28 ENCOUNTER — Other Ambulatory Visit: Payer: Self-pay | Admitting: Internal Medicine

## 2014-12-29 NOTE — Telephone Encounter (Signed)
Done hardcopy to Cherina  

## 2014-12-29 NOTE — Telephone Encounter (Signed)
MD out of office Tramadol was last filled 11/27/14 #30 no refill Patient was last seen with Dr Linna Darner 10/18/14

## 2014-12-29 NOTE — Telephone Encounter (Signed)
Rx was sent to pharmacy. 

## 2015-01-19 ENCOUNTER — Other Ambulatory Visit: Payer: Self-pay | Admitting: Internal Medicine

## 2015-02-01 ENCOUNTER — Other Ambulatory Visit (INDEPENDENT_AMBULATORY_CARE_PROVIDER_SITE_OTHER): Payer: Medicare Other

## 2015-02-01 ENCOUNTER — Encounter: Payer: Self-pay | Admitting: Internal Medicine

## 2015-02-01 ENCOUNTER — Ambulatory Visit (INDEPENDENT_AMBULATORY_CARE_PROVIDER_SITE_OTHER)
Admission: RE | Admit: 2015-02-01 | Discharge: 2015-02-01 | Disposition: A | Discharge status: Home / Self Care | Payer: Medicare Other | Source: Ambulatory Visit | Attending: Internal Medicine | Admitting: Internal Medicine

## 2015-02-01 ENCOUNTER — Ambulatory Visit (INDEPENDENT_AMBULATORY_CARE_PROVIDER_SITE_OTHER): Payer: Medicare Other | Admitting: Internal Medicine

## 2015-02-01 VITALS — BP 110/72 | HR 69 | Temp 98.0°F | Resp 16 | Wt 172.1 lb

## 2015-02-01 DIAGNOSIS — M25532 Pain in left wrist: Secondary | ICD-10-CM

## 2015-02-01 DIAGNOSIS — R5383 Other fatigue: Secondary | ICD-10-CM

## 2015-02-01 DIAGNOSIS — M19032 Primary osteoarthritis, left wrist: Secondary | ICD-10-CM | POA: Diagnosis not present

## 2015-02-01 LAB — CBC WITH DIFFERENTIAL/PLATELET
BASOS ABS: 0 10*3/uL (ref 0.0–0.1)
BASOS PCT: 0.4 % (ref 0.0–3.0)
EOS PCT: 1.9 % (ref 0.0–5.0)
Eosinophils Absolute: 0.2 10*3/uL (ref 0.0–0.7)
HCT: 46.6 % (ref 39.0–52.0)
Hemoglobin: 15.5 g/dL (ref 13.0–17.0)
Lymphocytes Relative: 28.8 % (ref 12.0–46.0)
Lymphs Abs: 2.9 10*3/uL (ref 0.7–4.0)
MCHC: 33.3 g/dL (ref 30.0–36.0)
MCV: 95.6 fl (ref 78.0–100.0)
MONO ABS: 0.5 10*3/uL (ref 0.1–1.0)
Monocytes Relative: 5.2 % (ref 3.0–12.0)
NEUTROS ABS: 6.4 10*3/uL (ref 1.4–7.7)
Neutrophils Relative %: 63.7 % (ref 43.0–77.0)
Platelets: 346 10*3/uL (ref 150.0–400.0)
RBC: 4.88 Mil/uL (ref 4.22–5.81)
RDW: 12.8 % (ref 11.5–15.5)
WBC: 10 10*3/uL (ref 4.0–10.5)

## 2015-02-01 LAB — HEPATIC FUNCTION PANEL
ALT: 15 U/L (ref 0–53)
AST: 20 U/L (ref 0–37)
Albumin: 4.3 g/dL (ref 3.5–5.2)
Alkaline Phosphatase: 91 U/L (ref 39–117)
BILIRUBIN DIRECT: 0 mg/dL (ref 0.0–0.3)
BILIRUBIN TOTAL: 0.4 mg/dL (ref 0.2–1.2)
Total Protein: 7.6 g/dL (ref 6.0–8.3)

## 2015-02-01 LAB — BASIC METABOLIC PANEL
BUN: 12 mg/dL (ref 6–23)
CO2: 30 meq/L (ref 19–32)
Calcium: 9.5 mg/dL (ref 8.4–10.5)
Chloride: 102 mEq/L (ref 96–112)
Creatinine, Ser: 0.66 mg/dL (ref 0.40–1.50)
GFR: 125.14 mL/min (ref >60.00)
Glucose, Bld: 80 mg/dL (ref 70–99)
Potassium: 4.2 mEq/L (ref 3.5–5.1)
SODIUM: 137 meq/L (ref 135–145)

## 2015-02-01 LAB — TSH: TSH: 2.28 u[IU]/mL (ref 0.35–4.50)

## 2015-02-01 LAB — RHEUMATOID FACTOR: Rhuematoid fact SerPl-aCnc: <10 IU/mL (ref <=14)

## 2015-02-01 LAB — SEDIMENTATION RATE: Sed Rate: 26 mm/hr — ABNORMAL HIGH (ref 0–22)

## 2015-02-01 LAB — T4, FREE: Free T4: 0.8 ng/dL (ref 0.60–1.60)

## 2015-02-01 LAB — URIC ACID: Uric Acid, Serum: 4.6 mg/dL (ref 4.0–7.8)

## 2015-02-01 NOTE — Progress Notes (Signed)
Pre visit review using our clinic review tool, if applicable. No additional management support is needed unless otherwise documented below in the visit note. 

## 2015-02-01 NOTE — Patient Instructions (Signed)
  Your next office appointment will be determined based upon review of your pending labs  and  xrays  Those written interpretation of the lab results and instructions will be transmitted to you by mail for your records.  Critical results will be called.   Followup as needed for any active or acute issue. Please report any significant change in your symptoms. 

## 2015-02-01 NOTE — Progress Notes (Signed)
   Subjective:    Patient ID: Andrew Foster, male    DOB: 10/29/1940, 74 y.o.   MRN: [id number]  HPI He describes pain and swelling in the left wrist which has progressed since February. It's rated as a 5 during the day and up to a 7-8 at night. He's used topical creams as well as tramadol and meloxicam with only temporary relief. He describes weakness of grip in the left hand which he notes while working on his automobile.  He does have a history of carpal tunnel syndrome surgery.  He also has profound fatigue. Despite sleeping 8-10 hours a night he does not feel rested. His sleep is disturbed by nocturia 3-4 times each night. He has been followed by urologist.   Review of Systems He denies any knowledge of excessive snoring are sleep apnea but he lives by himself.  He denies fever, chills, sweats, weight loss.  He has no associated blurred vision, double vision, or loss of vision.   He has no change in his hair, skin, nails.  There is no intolerance to cold.    Objective:   Physical Exam  Pertinent or positive findings include: He has an upper plate and lower implants.  He has bilateral ptosis.  He does have decreased grip on the left and decreased strength to opposition with the thumb opposed to the index finger and fifth digit.  He has decreased flexion of the left wrist.  He has DIP osteoarthritic changes of hands.  There is a small supraumbilical hernia.  General appearance :adequately nourished; in no distress. Eyes: No conjunctival inflammation or scleral icterus is present. Oral exam:  Lips and gums are healthy appearing.There is no oropharyngeal erythema or exudate noted. Heart:  Normal rate and regular rhythm. S1 and S2 normal without gallop, murmur, click, rub or other extra sounds   Lungs:Chest clear to auscultation; no wheezes, rhonchi,rales ,or rubs present.No increased work of breathing.  Abdomen: bowel sounds normal, soft and non-tender without masses,  organomegaly or hernias noted.  No guarding or rebound.  Vascular : all pulses equal ; no bruits present. Skin:Warm & dry.  Intact without suspicious lesions or rashes ; no tenting or jaundice  Lymphatic: No lymphadenopathy is noted about the head, neck, axilla Neuro: Strength, tone & DTRs normal.      Assessment & Plan:  #1 left wrist pain and swelling  #2 fatigue  Plan: See orders and recommendations

## 2015-02-03 ENCOUNTER — Other Ambulatory Visit: Payer: Self-pay | Admitting: Internal Medicine

## 2015-02-05 NOTE — Telephone Encounter (Signed)
Ok x 1

## 2015-02-05 NOTE — Telephone Encounter (Signed)
Andrew Foster faxed script back to pharmacy...Andrew Foster

## 2015-02-19 DIAGNOSIS — R972 Elevated prostate specific antigen [PSA]: Secondary | ICD-10-CM | POA: Diagnosis not present

## 2015-02-23 DIAGNOSIS — M19032 Primary osteoarthritis, left wrist: Secondary | ICD-10-CM | POA: Diagnosis not present

## 2015-03-02 DIAGNOSIS — N401 Enlarged prostate with lower urinary tract symptoms: Secondary | ICD-10-CM | POA: Diagnosis not present

## 2015-03-02 DIAGNOSIS — R972 Elevated prostate specific antigen [PSA]: Secondary | ICD-10-CM | POA: Diagnosis not present

## 2015-03-02 DIAGNOSIS — R351 Nocturia: Secondary | ICD-10-CM | POA: Diagnosis not present

## 2015-03-04 ENCOUNTER — Other Ambulatory Visit: Payer: Self-pay | Admitting: Internal Medicine

## 2015-03-06 ENCOUNTER — Other Ambulatory Visit: Payer: Self-pay | Admitting: Emergency Medicine

## 2015-03-06 MED ORDER — TRAMADOL HCL 50 MG PO TABS: 50.0000 mg | ORAL_TABLET | Freq: Two times a day (BID) | ORAL | Status: DC | PRN

## 2015-03-06 NOTE — Telephone Encounter (Signed)
Loratidine is OTC Tramadol OK X1  My retirement date is 09/01/2015; but I will be in office only T, [place] during the months Oct-Dec.You should transition your care to another PCP by Oct 1,2016.

## 2015-03-06 NOTE — Telephone Encounter (Signed)
Please advise on tramadol rx request, thanks

## 2015-03-09 ENCOUNTER — Other Ambulatory Visit: Payer: Self-pay | Admitting: Emergency Medicine

## 2015-03-09 MED ORDER — LORATADINE 10 MG PO TABS: 10.0000 mg | ORAL_TABLET | Freq: Every day | ORAL | Status: DC

## 2015-03-16 NOTE — Telephone Encounter (Signed)
OK X 3 mos but this is OTC

## 2015-03-21 ENCOUNTER — Ambulatory Visit: Payer: BLUE CROSS/BLUE SHIELD | Admitting: Internal Medicine

## 2015-04-03 ENCOUNTER — Other Ambulatory Visit: Payer: Self-pay | Admitting: Internal Medicine

## 2015-04-03 NOTE — Telephone Encounter (Signed)
OK X1  My retirement date is 09/01/2015; but I will be in office on a limited schedule Oct-Dec. To guarantee continuity of care you should transition your care to another PCP by Oct 1,2016.     

## 2015-04-03 NOTE — Telephone Encounter (Signed)
Please advise, last OV 6/16

## 2015-04-04 ENCOUNTER — Other Ambulatory Visit: Payer: Self-pay | Admitting: Emergency Medicine

## 2015-04-04 MED ORDER — TRAMADOL HCL 50 MG PO TABS: 50.0000 mg | ORAL_TABLET | Freq: Two times a day (BID) | ORAL | Status: DC | PRN

## 2015-04-04 NOTE — Telephone Encounter (Signed)
Refill for Tramadol faxed to pharm.

## 2015-04-13 DIAGNOSIS — R972 Elevated prostate specific antigen [PSA]: Secondary | ICD-10-CM | POA: Diagnosis not present

## 2015-04-13 DIAGNOSIS — N411 Chronic prostatitis: Secondary | ICD-10-CM | POA: Diagnosis not present

## 2015-05-04 ENCOUNTER — Other Ambulatory Visit: Payer: Self-pay | Admitting: Internal Medicine

## 2015-05-09 NOTE — Telephone Encounter (Signed)
OK X1  My retirement date is 09/01/2015; but I will be in office on a limited schedule Oct-Dec. To guarantee continuity of care you should transition your care to another PCP by Oct 1,2016.     

## 2015-05-09 NOTE — Telephone Encounter (Signed)
Faxed script to [facility]...Andrew Foster

## 2015-05-09 NOTE — Telephone Encounter (Signed)
Last OV 02/01/15, last refills 04/04/15. Please advise.

## 2015-05-14 ENCOUNTER — Other Ambulatory Visit: Payer: Self-pay | Admitting: Internal Medicine

## 2015-06-04 ENCOUNTER — Other Ambulatory Visit: Payer: Self-pay | Admitting: Internal Medicine

## 2015-06-05 NOTE — Telephone Encounter (Signed)
OK X 1  My retirement date is 09/01/2015; but I will be in office on a limited schedule Oct-Dec. To guarantee continuity of care you should transition your care to another PCP ASAP    

## 2015-06-05 NOTE — Telephone Encounter (Signed)
Please advise. Last OV 6/16, last refill 05/09/15. Please advise

## 2015-06-06 NOTE — Telephone Encounter (Signed)
RX for Tramadol has been faxed to pharm.

## 2015-07-06 ENCOUNTER — Other Ambulatory Visit: Payer: Self-pay | Admitting: Internal Medicine

## 2015-07-06 NOTE — Telephone Encounter (Signed)
OK  He needs to be seen before additional refills I recommend Andrew Foster

## 2015-07-06 NOTE — Telephone Encounter (Signed)
Sent loratadine electronically had to live renewal for tramadol on pharmacy VM...Johny Chess

## 2015-08-10 ENCOUNTER — Other Ambulatory Visit: Payer: Self-pay | Admitting: Internal Medicine

## 2015-08-10 MED ORDER — TRAMADOL HCL 50 MG PO TABS: 50.0000 mg | ORAL_TABLET | Freq: Two times a day (BID) | ORAL | Status: DC | PRN

## 2015-08-10 NOTE — Addendum Note (Signed)
Addended by: Earnstine Regal on: 08/10/2015 12:37 PM   Modules accepted: Orders

## 2015-08-10 NOTE — Telephone Encounter (Signed)
Printed rx md sign has been fax to [facility]...Johny Chess

## 2015-08-10 NOTE — Telephone Encounter (Signed)
R X 1 He MUST establish with new MD in Jan 2017 for any more refills

## 2015-08-10 NOTE — Telephone Encounter (Signed)
Please advise, Last OV 6/16

## 2015-09-13 ENCOUNTER — Encounter: Payer: Self-pay | Admitting: Internal Medicine

## 2015-09-13 ENCOUNTER — Ambulatory Visit (INDEPENDENT_AMBULATORY_CARE_PROVIDER_SITE_OTHER): Payer: Medicare Other | Admitting: Internal Medicine

## 2015-09-13 VITALS — BP 124/86 | HR 82 | Temp 98.6°F | Resp 18 | Ht 68.5 in | Wt 169.8 lb

## 2015-09-13 DIAGNOSIS — Z0000 Encounter for general adult medical examination without abnormal findings: Secondary | ICD-10-CM | POA: Diagnosis not present

## 2015-09-13 MED ORDER — FLUTICASONE PROPIONATE 50 MCG/ACT NA SUSP: 2.0000 | Freq: Every day | NASAL | Status: DC

## 2015-09-13 NOTE — Patient Instructions (Signed)
We do not need labs today.  We have sent in flonase for the sinuses to help clear them up. Use 2 sprays in each nostril once a day.   Health Maintenance, Male A healthy lifestyle and preventative care can promote health and wellness.  Maintain regular health, dental, and eye exams.  Eat a healthy diet. Foods like vegetables, fruits, whole grains, low-fat dairy products, and lean protein foods contain the nutrients you need and are low in calories. Decrease your intake of foods high in solid fats, added sugars, and salt. Get information about a proper diet from your health care provider, if necessary.  Regular physical exercise is one of the most important things you can do for your health. Most adults should get at least 150 minutes of moderate-intensity exercise (any activity that increases your heart rate and causes you to sweat) each week. In addition, most adults need muscle-strengthening exercises on 2 or more days a week.   Maintain a healthy weight. The body mass index (BMI) is a screening tool to identify possible weight problems. It provides an estimate of body fat based on height and weight. Your health care provider can find your BMI and can help you achieve or maintain a healthy weight. For males 20 years and older:  A BMI below 18.5 is considered underweight.  A BMI of 18.5 to 24.9 is normal.  A BMI of 25 to 29.9 is considered overweight.  A BMI of 30 and above is considered obese.  Maintain normal blood lipids and cholesterol by exercising and minimizing your intake of saturated fat. Eat a balanced diet with plenty of fruits and vegetables. Blood tests for lipids and cholesterol should begin at age 45 and be repeated every 5 years. If your lipid or cholesterol levels are high, you are over age 17, or you are at high risk for heart disease, you may need your cholesterol levels checked more frequently.Ongoing high lipid and cholesterol levels should be treated with medicines if  diet and exercise are not working.  If you smoke, find out from your health care provider how to quit. If you do not use tobacco, do not start.  Lung cancer screening is recommended for adults aged 36-80 years who are at high risk for developing lung cancer because of a history of smoking. A yearly low-dose CT scan of the lungs is recommended for people who have at least a 30-pack-year history of smoking and are current smokers or have quit within the past 15 years. A pack year of smoking is smoking an average of 1 pack of cigarettes a day for 1 year (for example, a 30-pack-year history of smoking could mean smoking 1 pack a day for 30 years or 2 packs a day for 15 years). Yearly screening should continue until the smoker has stopped smoking for at least 15 years. Yearly screening should be stopped for people who develop a health problem that would prevent them from having lung cancer treatment.  If you choose to drink alcohol, do not have more than 2 drinks per day. One drink is considered to be 12 oz (360 mL) of beer, 5 oz (150 mL) of wine, or 1.5 oz (45 mL) of liquor.  Avoid the use of street drugs. Do not share needles with anyone. Ask for help if you need support or instructions about stopping the use of drugs.  High blood pressure causes heart disease and increases the risk of stroke. High blood pressure is more likely to develop  in:  People who have blood pressure in the end of the normal range (100-139/85-89 mm Hg).  People who are overweight or obese.  People who are African American.  If you are 64-63 years of age, have your blood pressure checked every 3-5 years. If you are 52 years of age or older, have your blood pressure checked every year. You should have your blood pressure measured twice--once when you are at a hospital or clinic, and once when you are not at a hospital or clinic. Record the average of the two measurements. To check your blood pressure when you are not at a  hospital or clinic, you can use:  An automated blood pressure machine at a pharmacy.  A home blood pressure monitor.  If you are 12-45 years old, ask your health care provider if you should take aspirin to prevent heart disease.  Diabetes screening involves taking a blood sample to check your fasting blood sugar level. This should be done once every 3 years after age 54 if you are at a normal weight and without risk factors for diabetes. Testing should be considered at a younger age or be carried out more frequently if you are overweight and have at least 1 risk factor for diabetes.  Colorectal cancer can be detected and often prevented. Most routine colorectal cancer screening begins at the age of 65 and continues through age 57. However, your health care provider may recommend screening at an earlier age if you have risk factors for colon cancer. On a yearly basis, your health care provider may provide home test kits to check for hidden blood in the stool. A small camera at the end of a tube may be used to directly examine the colon (sigmoidoscopy or colonoscopy) to detect the earliest forms of colorectal cancer. Talk to your health care provider about this at age 57 when routine screening begins. A direct exam of the colon should be repeated every 5-10 years through age 55, unless early forms of precancerous polyps or small growths are found.  People who are at an increased risk for hepatitis B should be screened for this virus. You are considered at high risk for hepatitis B if:  You were born in a country where hepatitis B occurs often. Talk with your health care provider about which countries are considered high risk.  Your parents were born in a high-risk country and you have not received a shot to protect against hepatitis B (hepatitis B vaccine).  You have HIV or AIDS.  You use needles to inject street drugs.  You live with, or have sex with, someone who has hepatitis B.  You are a  man who has sex with other men (MSM).  You get hemodialysis treatment.  You take certain medicines for conditions like cancer, organ transplantation, and autoimmune conditions.  Hepatitis C blood testing is recommended for all people born from [birth year] through 1966 and any individual with known risk factors for hepatitis C.  Healthy men should no longer receive prostate-specific antigen (PSA) blood tests as part of routine cancer screening. Talk to your health care provider about prostate cancer screening.  Testicular cancer screening is not recommended for adolescents or adult males who have no symptoms. Screening includes self-exam, a health care provider exam, and other screening tests. Consult with your health care provider about any symptoms you have or any concerns you have about testicular cancer.  Practice safe sex. Use condoms and avoid high-risk sexual practices to reduce the  spread of sexually transmitted infections (STIs).  You should be screened for STIs, including gonorrhea and chlamydia if:  You are sexually active and are younger than 24 years.  You are older than 24 years, and your health care provider tells you that you are at risk for this type of infection.  Your sexual activity has changed since you were last screened, and you are at an increased risk for chlamydia or gonorrhea. Ask your health care provider if you are at risk.  If you are at risk of being infected with HIV, it is recommended that you take a prescription medicine daily to prevent HIV infection. This is called pre-exposure prophylaxis (PrEP). You are considered at risk if:  You are a man who has sex with other men (MSM).  You are a heterosexual man who is sexually active with multiple partners.  You take drugs by injection.  You are sexually active with a partner who has HIV.  Talk with your health care provider about whether you are at high risk of being infected with HIV. If you choose to begin PrEP,  you should first be tested for HIV. You should then be tested every 3 months for as long as you are taking PrEP.  Use sunscreen. Apply sunscreen liberally and repeatedly throughout the day. You should seek shade when your shadow is shorter than you. Protect yourself by wearing long sleeves, pants, a wide-brimmed hat, and sunglasses year round whenever you are outdoors.  Tell your health care provider of new moles or changes in moles, especially if there is a change in shape or color. Also, tell your health care provider if a mole is larger than the size of a pencil eraser.  A one-time screening for abdominal aortic aneurysm (AAA) and surgical repair of large AAAs by ultrasound is recommended for men aged 49-75 years who are current or former smokers.  Stay current with your vaccines (immunizations).   This information is not intended to replace advice given to you by your health care provider. Make sure you discuss any questions you have with your health care provider.   Document Released: 02/14/2008 Document Revised: 09/08/2014 Document Reviewed: 01/13/2011 Elsevier Interactive Patient Education [employer].

## 2015-09-13 NOTE — Progress Notes (Signed)
   Subjective:    Patient ID: Andrew Foster, male    DOB: 1940/09/14, 75 y.o.   MRN: [id number]  HPI Here for medicare wellness, no new complaints. Please see A/P for status and treatment of chronic medical problems.   Diet: heart healthy Physical activity: sedentary Depression/mood screen: negative Hearing: intact to whispered voice Visual acuity: grossly normal, performs annual eye exam  ADLs: capable Fall risk: none Home safety: good Cognitive evaluation: intact to orientation, naming, recall and repetition EOL planning: adv directives discussed, in place  I have personally reviewed and have noted 1. The patient's medical and social history - reviewed today no changes 2. Their use of alcohol, tobacco or illicit drugs 3. Their current medications and supplements 4. The patient's functional ability including ADL's, fall risks, home safety risks and hearing or visual impairment. 5. Diet and physical activities 6. Evidence for depression or mood disorders 7. Care team reviewed and updated (available in snapshot)  Review of Systems  Constitutional: Negative for fever, activity change, appetite change, fatigue and unexpected weight change.  HENT: Negative.   Eyes: Negative.   Respiratory: Negative for cough, chest tightness, shortness of breath and wheezing.   Cardiovascular: Negative for chest pain, palpitations and leg swelling.  Gastrointestinal: Negative for nausea, abdominal pain, diarrhea, constipation and abdominal distention.  Musculoskeletal: Positive for arthralgias. Negative for myalgias and back pain.  Skin: Negative.   Neurological: Negative.   Psychiatric/Behavioral: Negative.       Objective:   Physical Exam  Constitutional: He is oriented to person, place, and time. He appears well-developed and well-nourished.  Overweight  HENT:  Head: Normocephalic and atraumatic.  Oropharynx with mild redness and clear drainage  Eyes: EOM are normal.  Neck: Normal  range of motion.  Cardiovascular: Normal rate and regular rhythm.   Pulmonary/Chest: Effort normal. No respiratory distress. He has no wheezes. He has no rales.  Abdominal: Soft. Bowel sounds are normal. He exhibits no distension. There is no tenderness. There is no rebound.  Musculoskeletal: He exhibits no edema.  Neurological: He is alert and oriented to person, place, and time. Coordination normal.  Skin: Skin is warm and dry.  Psychiatric: He has a normal mood and affect.   Filed Vitals:   09/13/15 1506  BP: 124/86  Pulse: 82  Temp: 98.6 F (37 C)  TempSrc: Oral  Resp: 18  Height: 5' 8.5" (1.74 m)  Weight: 169 lb 12.8 oz (77.021 kg)  SpO2: 97%      Assessment & Plan:

## 2015-09-13 NOTE — Progress Notes (Signed)
Pre visit review using our clinic review tool, if applicable. No additional management support is needed unless otherwise documented below in the visit note. 

## 2015-09-14 ENCOUNTER — Encounter: Payer: Self-pay | Admitting: Internal Medicine

## 2015-09-14 DIAGNOSIS — Z0000 Encounter for general adult medical examination without abnormal findings: Secondary | ICD-10-CM | POA: Insufficient documentation

## 2015-09-14 NOTE — Assessment & Plan Note (Signed)
Does not want colonoscopy, refuses pneumonia shot. Counseled on the importance of screening and immunizations on maintaining good health. Tetanus is up to date. Declines flu shot and shingles shot. Given 10 year screening recommendations. Non-smoker.

## 2015-09-15 ENCOUNTER — Other Ambulatory Visit: Payer: Self-pay | Admitting: Internal Medicine

## 2015-09-17 NOTE — Telephone Encounter (Signed)
Faxed to pharmacy

## 2015-10-03 DIAGNOSIS — Z23 Encounter for immunization: Secondary | ICD-10-CM | POA: Diagnosis not present

## 2015-10-08 DIAGNOSIS — R972 Elevated prostate specific antigen [PSA]: Secondary | ICD-10-CM | POA: Diagnosis not present

## 2015-10-12 DIAGNOSIS — R972 Elevated prostate specific antigen [PSA]: Secondary | ICD-10-CM | POA: Diagnosis not present

## 2015-10-12 DIAGNOSIS — N401 Enlarged prostate with lower urinary tract symptoms: Secondary | ICD-10-CM | POA: Diagnosis not present

## 2015-10-12 DIAGNOSIS — Z0000 Encounter for general adult medical examination without abnormal findings: Secondary | ICD-10-CM | POA: Diagnosis not present

## 2015-11-06 ENCOUNTER — Telehealth: Payer: Self-pay | Admitting: Internal Medicine

## 2015-11-06 ENCOUNTER — Encounter: Payer: Self-pay | Admitting: Family Medicine

## 2015-11-06 ENCOUNTER — Ambulatory Visit (INDEPENDENT_AMBULATORY_CARE_PROVIDER_SITE_OTHER): Payer: Medicare Other | Admitting: Family Medicine

## 2015-11-06 VITALS — BP 118/72 | HR 78 | Temp 97.5°F | Ht 68.5 in | Wt 171.9 lb

## 2015-11-06 DIAGNOSIS — H6591 Unspecified nonsuppurative otitis media, right ear: Secondary | ICD-10-CM

## 2015-11-06 DIAGNOSIS — R42 Dizziness and giddiness: Secondary | ICD-10-CM | POA: Diagnosis not present

## 2015-11-06 DIAGNOSIS — J309 Allergic rhinitis, unspecified: Secondary | ICD-10-CM

## 2015-11-06 MED ORDER — MECLIZINE HCL 12.5 MG PO TABS: 12.5000 mg | ORAL_TABLET | Freq: Three times a day (TID) | ORAL | Status: DC | PRN

## 2015-11-06 NOTE — Telephone Encounter (Signed)
Patient Name: Andrew Foster  DOB: 1941-04-27    Initial Comment caller states he has dizziness and trouble walking   Nurse Assessment  Nurse: Mallie Mussel, RN, Alveta Heimlich Date/Time Eilene Ghazi Time): 11/06/2015 10:05:52 AM  Confirm and document reason for call. If symptomatic, describe symptoms. You must click the next button to save text entered. ---Caller states that he has had dizziness for the past 3 days. He has had vertigo in the past, but this is different. He is having some trouble walking with this. He states that when he first gets up, he's had this where it lasted only a few minutes. The past 3 days has been rough. He states that it "feels a little odd in his forehead and in his eyes." He denies puffiness of his face and redness of the skin. He denies fever.  Has the patient traveled out of the country within the last 30 days? ---No  Does the patient have any new or worsening symptoms? ---Yes  Will a triage be completed? ---Yes  Related visit to physician within the last 2 weeks? ---No  Does the PT have any chronic conditions? (i.e. diabetes, asthma, etc.) ---Yes  List chronic conditions. ---Borderline Diabetes  Is this a behavioral health or substance abuse call? ---No     Guidelines    Guideline Title Affirmed Question Affirmed Notes  Dizziness - Lightheadedness SEVERE dizziness (e.g., unable to stand, requires support to walk, feels like passing out now)    Final Disposition User   Go to ED Now (or PCP triage) Mallie Mussel, RN, Alveta Heimlich    Comments  No appointments available at [facility]. I scheduled a 30 minute appointment with Dr. Colin Benton for today at 1:45pm at [facility].   Referrals  REFERRED TO PCP OFFICE   Disagree/Comply: Comply

## 2015-11-06 NOTE — Progress Notes (Signed)
HPI:  Elisa is a very pleasant 75 year old here for an acute visit for vertigo. He reports an intermittent history of positional vertigo, that usually resolves on its own. He has had a recurrence of vertigo for the last 3 days. Reports brief spells of vertigo that occur with quick movements of his head such as getting out of bed, and seem to occur more frequently with movements towards the Left. He is not having headaches, weakness, numbness, vision changes, changes in hearing, tinnitus, fevers, chest pain, shortness of breath, orthopnea, syncope, nausea or vomiting. He has a history of sinus issues and he was taking Flonase and Claritin for this. This has improved significantly, but he does sometimes have some sinus pressure. He has taken meclizine in the past for his vertigo, and still has some pills at home, but he is unsure if they are expired or not.  ROS: See pertinent positives and negatives per HPI.  Past Medical History  Diagnosis Date  . Diverticulosis   . BPH (benign prostatic hyperplasia)     Past Surgical History  Procedure Laterality Date  . Hernia repair    . Carpal tunnel release    . Colonoscopy      Tics; [facility]  . Prostate biopsy      Dr Janice Norrie  . Intra articular steroids      R shoulder  . Vasectomy      Family History  Problem Relation Age of Onset  . Heart attack Mother 23  . Diabetes Mother   . Diabetes Sister     TWO sisters  . Diabetes Brother     TWO brothers  . Diabetes type I Brother   . Stroke Neg Hx   . Cancer Neg Hx     Social History   Social History  . Marital Status: Widowed    Spouse Name: N/A  . Number of Children: N/A  . Years of Education: N/A   Social History Main Topics  . Smoking status: Former Smoker    Quit date: 09/01/1974  . Smokeless tobacco: None     Comment: smoked 1956-1976, up to 3 ppd  . Alcohol Use: No  . Drug Use: No  . Sexual Activity: Not Asked   Other Topics Concern  . None   Social History  Narrative     Current outpatient prescriptions:  .  fluticasone (FLONASE) 50 MCG/ACT nasal spray, Place 2 sprays into both nostrils daily., Disp: 16 g, Rfl: 6 .  loratadine (CLARITIN) 10 MG tablet, Take 1 tablet (10 mg total) by mouth daily., Disp: 30 tablet, Rfl: 2 .  meloxicam (MOBIC) 15 MG tablet, Take 15 mg by mouth daily. , Disp: , Rfl:  .  Multiple Vitamin (MULTIVITAMIN) tablet, Take 1 tablet by mouth daily. , Disp: , Rfl:  .  omeprazole (PRILOSEC OTC) 20 MG tablet, Take 20 mg by mouth daily. , Disp: , Rfl:  .  tamsulosin (FLOMAX) 0.4 MG CAPS capsule, TAKE ONE CAPSULE BY MOUTH AT BEDTIME, Disp: 90 capsule, Rfl: 1 .  traMADol (ULTRAM) 50 MG tablet, TAKE ONE TABLET BY MOUTH EVERY 12 HOURS AS NEEDED, Disp: 30 tablet, Rfl: 1 .  valACYclovir (VALTREX) 1000 MG tablet, Take 1 tablet (1,000 mg total) by mouth 2 (two) times daily., Disp: 20 tablet, Rfl: 0 .  meclizine (ANTIVERT) 12.5 MG tablet, Take 1 tablet (12.5 mg total) by mouth 3 (three) times daily as needed for dizziness., Disp: 30 tablet, Rfl: 0  EXAM:  Filed Vitals:  11/06/15 1323  BP: 118/72  Pulse: 78  Temp: 97.5 F (36.4 C)    Body mass index is 25.75 kg/(m^2).  GENERAL: vitals reviewed and listed above, alert, oriented, appears well hydrated and in no acute distress  HEENT: atraumatic, conjunttiva clear, PERRLA, no obvious abnormalities on inspection of external nose and ears, normal appearance of ear canals and TMs with a clear middle ear effusion on the right, clear nasal congestion, boggy pale turbinates, mild post oropharyngeal erythema with PND, no tonsillar edema or exudate, no sinus TTP  NECK: no obvious masses on inspection, no bruits, no meningeal signs  LUNGS: clear to auscultation bilaterally, no wheezes, rales or rhonchi, good air movement  CV: HRRR, no peripheral edema  MS: moves all extremities without noticeable abnormality, walks with a cane, walks cautiously but without lurching or gait  abnormality  PSYCH.NEUR: pleasant and cooperative, no obvious depression or anxiety, speech and thought processing are grossly intact, cranial nerves II-XII grossly intact, finger to nose normal, Dix Hallpike to the right with some rotational nystagmus and reproduction of his symptoms.   ASSESSMENT AND PLAN:  Discussed the following assessment and plan:  Vertigo -we discussed possible serious and likely etiologies, workup and treatment, treatment risks and return precautions - given the recurrent nature and classic signs and symptoms suspect BPPV most likely -after this discussion, Sriyaan opted for meclizine prn -follow up advised with PCP in 2-3 weeks, advised may need vestibular rehab and further evaluation if symptoms persist -of course, we advised Austyn  to return or notify a doctor immediately if symptoms worsen or persist or new concerns arise.  Allergic rhinitis, unspecified allergic rhinitis type Middle ear effusion, right -Doubt that this is the cause of his vertigo, but advised treatment with short course of nasal decongestant after discussion of risks, and repeat starting his Flonase.   -Patient advised to return or notify a doctor immediately if symptoms worsen or persist or new concerns arise.  Patient Instructions  Please schedule a follow-up with your regular doctor in about 2-3 weeks.  Please use the meclizine as needed. Use caution with walking and do not drive while you are having the vertigo.  Please use Afrin nasal spray, this is available over-the-counter, twice daily for 5 days.  Please restart her Flonase nasal spray and continue until you see your doctor.  Seek care immediately if her symptoms are worsening or you have  new concerns     Cena Bruhn R.

## 2015-11-06 NOTE — Patient Instructions (Signed)
Please schedule a follow-up with your regular doctor in about 2-3 weeks.  Please use the meclizine as needed. Use caution with walking and do not drive while you are having the vertigo.  Please use Afrin nasal spray, this is available over-the-counter, twice daily for 5 days.  Please restart her Flonase nasal spray and continue until you see your doctor.  Seek care immediately if her symptoms are worsening or you have  new concerns

## 2015-11-06 NOTE — Progress Notes (Signed)
Pre visit review using our clinic review tool, if applicable. No additional management support is needed unless otherwise documented below in the visit note. 

## 2015-11-15 ENCOUNTER — Other Ambulatory Visit: Payer: Self-pay | Admitting: Internal Medicine

## 2015-11-15 NOTE — Telephone Encounter (Signed)
Faxed script back to [facility].../lmb 

## 2015-12-17 ENCOUNTER — Other Ambulatory Visit: Payer: Self-pay | Admitting: Family Medicine

## 2016-01-14 ENCOUNTER — Other Ambulatory Visit: Payer: Self-pay | Admitting: Internal Medicine

## 2016-01-17 MED ORDER — TRAMADOL HCL 50 MG PO TABS: 50.0000 mg | ORAL_TABLET | Freq: Two times a day (BID) | ORAL | Status: DC | PRN

## 2016-01-17 NOTE — Telephone Encounter (Signed)
MD out of office pls advise on refill.../lmb 

## 2016-01-17 NOTE — Telephone Encounter (Signed)
Hardcopy given to Andrew Foster 

## 2016-01-17 NOTE — Telephone Encounter (Signed)
Faxed script back to [facility].../lmb 

## 2016-01-17 NOTE — Telephone Encounter (Signed)
Done hardcopy to Corinne  

## 2016-02-14 ENCOUNTER — Other Ambulatory Visit: Payer: Self-pay | Admitting: Internal Medicine

## 2016-02-21 ENCOUNTER — Other Ambulatory Visit: Payer: Self-pay | Admitting: *Deleted

## 2016-02-21 MED ORDER — MELOXICAM 15 MG PO TABS: 15.0000 mg | ORAL_TABLET | Freq: Every day | ORAL | Status: DC

## 2016-02-21 MED ORDER — MECLIZINE HCL 12.5 MG PO TABS: ORAL_TABLET | ORAL | Status: DC

## 2016-03-19 ENCOUNTER — Other Ambulatory Visit: Payer: Self-pay | Admitting: Internal Medicine

## 2016-03-19 NOTE — Telephone Encounter (Signed)
Please advise 

## 2016-03-19 NOTE — Telephone Encounter (Signed)
Filled, needs office visit for further refills as due for 6 month follow up this month and no future visits scheduled.

## 2016-04-15 DIAGNOSIS — R972 Elevated prostate specific antigen [PSA]: Secondary | ICD-10-CM | POA: Diagnosis not present

## 2016-04-18 ENCOUNTER — Other Ambulatory Visit: Payer: Self-pay | Admitting: Internal Medicine

## 2016-04-18 NOTE — Telephone Encounter (Signed)
Faxed script back to [facility].../lmb 

## 2016-04-21 ENCOUNTER — Telehealth: Payer: Self-pay | Admitting: *Deleted

## 2016-04-21 NOTE — Telephone Encounter (Signed)
This was filled by our office on 04/18/16 and is not due for refill.

## 2016-04-21 NOTE — Telephone Encounter (Signed)
Rec'd fax pt requesting refill on Tramadol. Last filled 03/19/16...Johny Chess

## 2016-04-22 DIAGNOSIS — R972 Elevated prostate specific antigen [PSA]: Secondary | ICD-10-CM | POA: Diagnosis not present

## 2016-04-22 DIAGNOSIS — N400 Enlarged prostate without lower urinary tract symptoms: Secondary | ICD-10-CM | POA: Diagnosis not present

## 2016-04-25 ENCOUNTER — Other Ambulatory Visit: Payer: Self-pay | Admitting: Internal Medicine

## 2016-04-25 NOTE — Telephone Encounter (Signed)
Faxed script back to [facility].../lmb 

## 2016-04-30 ENCOUNTER — Other Ambulatory Visit: Payer: Self-pay

## 2016-05-01 ENCOUNTER — Other Ambulatory Visit: Payer: Self-pay | Admitting: Internal Medicine

## 2016-05-01 ENCOUNTER — Encounter: Payer: Self-pay | Admitting: Internal Medicine

## 2016-05-01 ENCOUNTER — Ambulatory Visit (INDEPENDENT_AMBULATORY_CARE_PROVIDER_SITE_OTHER): Payer: Medicare Other | Admitting: Internal Medicine

## 2016-05-01 ENCOUNTER — Ambulatory Visit (INDEPENDENT_AMBULATORY_CARE_PROVIDER_SITE_OTHER)
Admission: RE | Admit: 2016-05-01 | Discharge: 2016-05-01 | Disposition: A | Discharge status: Home / Self Care | Payer: Medicare Other | Source: Ambulatory Visit | Attending: Internal Medicine | Admitting: Internal Medicine

## 2016-05-01 DIAGNOSIS — R1030 Lower abdominal pain, unspecified: Secondary | ICD-10-CM

## 2016-05-01 DIAGNOSIS — Z23 Encounter for immunization: Secondary | ICD-10-CM

## 2016-05-01 DIAGNOSIS — M25551 Pain in right hip: Secondary | ICD-10-CM | POA: Diagnosis not present

## 2016-05-01 DIAGNOSIS — M25552 Pain in left hip: Secondary | ICD-10-CM | POA: Diagnosis not present

## 2016-05-01 NOTE — Progress Notes (Signed)
   Subjective:    Patient ID: Andrew Foster, male    DOB: Jan 04, 1941, 75 y.o.   MRN: [id number]  HPI The patient is a 75 YO man coming in for post-surgical pain. He had a hernia repaired a long time ago and has had pain there for 1 month. He notices it when he is walking for more than 1 mile or biking more than 1 mile or riding his motorcycle for more than 1 hour. No bulge that he has noticed, right side more pain than the left but some mild pain on the left as well. Has not tried anything for the pain but has limited his activities.   Review of Systems  Constitutional: Positive for activity change. Negative for appetite change, fatigue and fever.  Respiratory: Negative.   Cardiovascular: Negative.   Gastrointestinal: Negative.   Musculoskeletal: Positive for arthralgias and myalgias.  Skin: Negative.   Neurological: Negative.       Objective:   Physical Exam  Constitutional: He is oriented to person, place, and time. He appears well-developed and well-nourished.  HENT:  Head: Normocephalic and atraumatic.  Eyes: EOM are normal.  Neck: Normal range of motion.  Cardiovascular: Normal rate and regular rhythm.   Pulmonary/Chest: Effort normal. No respiratory distress. He has no wheezes. He has no rales.  Abdominal: Soft. Bowel sounds are normal. He exhibits no distension. There is no tenderness. There is no rebound.  Genitourinary:  Genitourinary Comments: No hernia detected bilaterally  Musculoskeletal: He exhibits no edema.  Neurological: He is alert and oriented to person, place, and time. Coordination normal.  Skin: Skin is warm and dry.   Vitals:   05/01/16 0802  BP: 130/68  Pulse: 71  Resp: 16  Temp: 97.8 F (36.6 C)  TempSrc: Oral  SpO2: 93%  Weight: 180 lb 6.4 oz (81.8 kg)  Height: 5' 8.5" (1.74 m)      Assessment & Plan:  Flu shot given at visit.

## 2016-05-01 NOTE — Progress Notes (Signed)
Pre visit review using our clinic review tool, if applicable. No additional management support is needed unless otherwise documented below in the visit note. 

## 2016-05-01 NOTE — Patient Instructions (Signed)
We will check the x-ray of the hips to see if this is related to arthritis in the hips.   If there are no signs of arthritis we will check a CT scan to check for another hernia.   If there is arthritis this could be the cause of the pain and we can have you use tylenol or naproxen for the pain.

## 2016-05-01 NOTE — Assessment & Plan Note (Signed)
Right greater than left. Checking bilateral hip x-ray for arthritis. If none or minimal will check CT for recurrent hernia. None detected on exam today.

## 2016-05-14 ENCOUNTER — Other Ambulatory Visit (INDEPENDENT_AMBULATORY_CARE_PROVIDER_SITE_OTHER): Payer: Medicare Other

## 2016-05-14 DIAGNOSIS — R1030 Lower abdominal pain, unspecified: Secondary | ICD-10-CM

## 2016-05-14 LAB — BASIC METABOLIC PANEL
BUN: 16 mg/dL (ref 6–23)
CALCIUM: 9 mg/dL (ref 8.4–10.5)
CO2: 26 meq/L (ref 19–32)
CREATININE: 0.83 mg/dL (ref 0.40–1.50)
Chloride: 105 mEq/L (ref 96–112)
GFR: 95.73 mL/min (ref >60.00)
GLUCOSE: 111 mg/dL — AB (ref 70–99)
Potassium: 3.7 mEq/L (ref 3.5–5.1)
SODIUM: 139 meq/L (ref 135–145)

## 2016-05-21 ENCOUNTER — Ambulatory Visit
Admission: RE | Admit: 2016-05-21 | Discharge: 2016-05-21 | Disposition: A | Discharge status: Home / Self Care | Payer: Medicare Other | Source: Ambulatory Visit | Attending: Internal Medicine | Admitting: Internal Medicine

## 2016-05-21 DIAGNOSIS — R1030 Lower abdominal pain, unspecified: Secondary | ICD-10-CM

## 2016-05-21 DIAGNOSIS — K439 Ventral hernia without obstruction or gangrene: Secondary | ICD-10-CM | POA: Diagnosis not present

## 2016-05-21 MED ORDER — IOPAMIDOL (ISOVUE-300) INJECTION 61%
100.0000 mL | Freq: Once | INTRAVENOUS | Status: AC | PRN
  Administered 2016-05-21: 100 mL via INTRAVENOUS

## 2016-06-17 ENCOUNTER — Other Ambulatory Visit: Payer: Self-pay | Admitting: Internal Medicine

## 2016-07-20 ENCOUNTER — Other Ambulatory Visit: Payer: Self-pay | Admitting: Internal Medicine

## 2016-07-21 NOTE — Telephone Encounter (Signed)
Sent to pharmacy 

## 2016-08-18 ENCOUNTER — Other Ambulatory Visit: Payer: Self-pay | Admitting: Internal Medicine

## 2016-09-22 ENCOUNTER — Other Ambulatory Visit: Payer: Self-pay | Admitting: Internal Medicine

## 2016-10-23 ENCOUNTER — Other Ambulatory Visit: Payer: Self-pay | Admitting: Internal Medicine

## 2016-11-01 ENCOUNTER — Encounter (HOSPITAL_COMMUNITY): Payer: Self-pay | Admitting: *Deleted

## 2016-11-01 ENCOUNTER — Emergency Department (HOSPITAL_COMMUNITY): Payer: Medicare Other

## 2016-11-01 ENCOUNTER — Inpatient Hospital Stay (HOSPITAL_COMMUNITY)
Admission: EM | Admit: 2016-11-01 | Discharge: 2016-11-04 | DRG: 872 | Disposition: A | Discharge status: Home / Self Care | Payer: Medicare Other | Attending: Internal Medicine | Admitting: Internal Medicine

## 2016-11-01 DIAGNOSIS — K219 Gastro-esophageal reflux disease without esophagitis: Secondary | ICD-10-CM | POA: Diagnosis present

## 2016-11-01 DIAGNOSIS — N400 Enlarged prostate without lower urinary tract symptoms: Secondary | ICD-10-CM | POA: Diagnosis present

## 2016-11-01 DIAGNOSIS — Z7982 Long term (current) use of aspirin: Secondary | ICD-10-CM

## 2016-11-01 DIAGNOSIS — R7881 Bacteremia: Secondary | ICD-10-CM

## 2016-11-01 DIAGNOSIS — N200 Calculus of kidney: Secondary | ICD-10-CM | POA: Diagnosis present

## 2016-11-01 DIAGNOSIS — G8929 Other chronic pain: Secondary | ICD-10-CM | POA: Diagnosis present

## 2016-11-01 DIAGNOSIS — R509 Fever, unspecified: Secondary | ICD-10-CM | POA: Diagnosis not present

## 2016-11-01 DIAGNOSIS — N10 Acute tubulo-interstitial nephritis: Secondary | ICD-10-CM | POA: Diagnosis present

## 2016-11-01 DIAGNOSIS — A4150 Gram-negative sepsis, unspecified: Secondary | ICD-10-CM | POA: Diagnosis not present

## 2016-11-01 DIAGNOSIS — A419 Sepsis, unspecified organism: Principal | ICD-10-CM | POA: Diagnosis present

## 2016-11-01 DIAGNOSIS — B961 Klebsiella pneumoniae [K. pneumoniae] as the cause of diseases classified elsewhere: Secondary | ICD-10-CM | POA: Diagnosis present

## 2016-11-01 DIAGNOSIS — M5489 Other dorsalgia: Secondary | ICD-10-CM | POA: Diagnosis not present

## 2016-11-01 DIAGNOSIS — K5730 Diverticulosis of large intestine without perforation or abscess without bleeding: Secondary | ICD-10-CM | POA: Diagnosis present

## 2016-11-01 DIAGNOSIS — N390 Urinary tract infection, site not specified: Secondary | ICD-10-CM | POA: Diagnosis present

## 2016-11-01 DIAGNOSIS — A414 Sepsis due to anaerobes: Secondary | ICD-10-CM

## 2016-11-01 DIAGNOSIS — A4159 Other Gram-negative sepsis: Secondary | ICD-10-CM

## 2016-11-01 DIAGNOSIS — Z79899 Other long term (current) drug therapy: Secondary | ICD-10-CM | POA: Diagnosis not present

## 2016-11-01 DIAGNOSIS — R109 Unspecified abdominal pain: Secondary | ICD-10-CM | POA: Diagnosis not present

## 2016-11-01 DIAGNOSIS — R0602 Shortness of breath: Secondary | ICD-10-CM | POA: Diagnosis not present

## 2016-11-01 DIAGNOSIS — Z87891 Personal history of nicotine dependence: Secondary | ICD-10-CM

## 2016-11-01 HISTORY — DX: Essential (primary) hypertension: I10

## 2016-11-01 HISTORY — DX: Acute pyelonephritis: N10

## 2016-11-01 LAB — INFLUENZA PANEL BY PCR (TYPE A & B)
Influenza A By PCR: NEGATIVE
Influenza B By PCR: NEGATIVE

## 2016-11-01 LAB — URINALYSIS, ROUTINE W REFLEX MICROSCOPIC
Bilirubin Urine: NEGATIVE
Glucose, UA: NEGATIVE mg/dL
Hgb urine dipstick: NEGATIVE
Ketones, ur: NEGATIVE mg/dL
Nitrite: NEGATIVE
Protein, ur: NEGATIVE mg/dL
Specific Gravity, Urine: 1.015 (ref 1.005–1.030)
pH: 7 (ref 5.0–8.0)

## 2016-11-01 LAB — CBC WITH DIFFERENTIAL/PLATELET
Basophils Absolute: 0 10*3/uL (ref 0.0–0.1)
Basophils Relative: 0 %
Eosinophils Absolute: 0 10*3/uL (ref 0.0–0.7)
Eosinophils Relative: 0 %
HCT: 43 % (ref 39.0–52.0)
Hemoglobin: 14.8 g/dL (ref 13.0–17.0)
Lymphocytes Relative: 5 %
Lymphs Abs: 0.7 10*3/uL (ref 0.7–4.0)
MCH: 32.1 pg (ref 26.0–34.0)
MCHC: 34.4 g/dL (ref 30.0–36.0)
MCV: 93.3 fL (ref 78.0–100.0)
Monocytes Absolute: 0.1 10*3/uL (ref 0.1–1.0)
Monocytes Relative: 1 %
Neutro Abs: 13.8 10*3/uL — ABNORMAL HIGH (ref 1.7–7.7)
Neutrophils Relative %: 94 %
Platelets: 191 10*3/uL (ref 150–400)
RBC: 4.61 MIL/uL (ref 4.22–5.81)
RDW: 12.5 % (ref 11.5–15.5)
WBC: 14.7 10*3/uL — ABNORMAL HIGH (ref 4.0–10.5)

## 2016-11-01 LAB — I-STAT TROPONIN, ED: TROPONIN I, POC: 0 ng/mL (ref 0.00–0.08)

## 2016-11-01 LAB — GLUCOSE, CAPILLARY: Glucose-Capillary: 102 mg/dL — ABNORMAL HIGH (ref 65–99)

## 2016-11-01 LAB — COMPREHENSIVE METABOLIC PANEL
ALT: 24 U/L (ref 17–63)
AST: 31 U/L (ref 15–41)
Albumin: 4.2 g/dL (ref 3.5–5.0)
Alkaline Phosphatase: 50 U/L (ref 38–126)
Anion gap: 11 (ref 5–15)
BUN: 18 mg/dL (ref 6–20)
CO2: 23 mmol/L (ref 22–32)
Calcium: 9.2 mg/dL (ref 8.9–10.3)
Chloride: 103 mmol/L (ref 101–111)
Creatinine, Ser: 0.75 mg/dL (ref 0.61–1.24)
GFR calc Af Amer: >60 mL/min (ref >60)
GFR calc non Af Amer: >60 mL/min (ref >60)
Glucose, Bld: 103 mg/dL — ABNORMAL HIGH (ref 65–99)
Potassium: 3.6 mmol/L (ref 3.5–5.1)
Sodium: 137 mmol/L (ref 135–145)
Total Bilirubin: 1.3 mg/dL — ABNORMAL HIGH (ref 0.3–1.2)
Total Protein: 6.8 g/dL (ref 6.5–8.1)

## 2016-11-01 LAB — I-STAT CG4 LACTIC ACID, ED
LACTIC ACID, VENOUS: 2.64 mmol/L — AB (ref 0.5–1.9)
Lactic Acid, Venous: 1.88 mmol/L (ref 0.5–1.9)

## 2016-11-01 MED ORDER — CEFTRIAXONE SODIUM 1 G IJ SOLR: 1.0000 g | INTRAMUSCULAR | Status: DC

## 2016-11-01 MED ORDER — ASPIRIN EC 81 MG PO TBEC
81.0000 mg | DELAYED_RELEASE_TABLET | Freq: Every day | ORAL | Status: DC
Start: 2016-11-01 — End: 2016-11-04
  Administered 2016-11-01 – 2016-11-04 (×4): 81 mg via ORAL
  Filled 2016-11-01 (×4): qty 1

## 2016-11-01 MED ORDER — SODIUM CHLORIDE 0.9 % IV SOLN
INTRAVENOUS | Status: DC
  Administered 2016-11-01: 21:00:00 via INTRAVENOUS

## 2016-11-01 MED ORDER — ACETAMINOPHEN 325 MG PO TABS
650.0000 mg | ORAL_TABLET | Freq: Four times a day (QID) | ORAL | Status: DC | PRN
  Administered 2016-11-02: 650 mg via ORAL
  Filled 2016-11-01 (×2): qty 2

## 2016-11-01 MED ORDER — LORATADINE 10 MG PO TABS
10.0000 mg | ORAL_TABLET | ORAL | Status: DC
  Administered 2016-11-03: 10 mg via ORAL
  Filled 2016-11-01: qty 1

## 2016-11-01 MED ORDER — ACETAMINOPHEN 325 MG PO TABS
650.0000 mg | ORAL_TABLET | Freq: Once | ORAL | Status: AC
  Administered 2016-11-01: 650 mg via ORAL
  Filled 2016-11-01: qty 2

## 2016-11-01 MED ORDER — SODIUM CHLORIDE 0.9 % IV BOLUS (SEPSIS)
1000.0000 mL | Freq: Once | INTRAVENOUS | Status: AC
  Administered 2016-11-01: 1000 mL via INTRAVENOUS

## 2016-11-01 MED ORDER — ONDANSETRON HCL 4 MG/2ML IJ SOLN: 4.0000 mg | Freq: Four times a day (QID) | INTRAMUSCULAR | Status: DC | PRN

## 2016-11-01 MED ORDER — MECLIZINE HCL 25 MG PO TABS: 12.5000 mg | ORAL_TABLET | Freq: Three times a day (TID) | ORAL | Status: DC | PRN

## 2016-11-01 MED ORDER — CEFTRIAXONE SODIUM 1 G IJ SOLR
1.0000 g | Freq: Once | INTRAMUSCULAR | Status: AC
  Administered 2016-11-01: 1 g via INTRAVENOUS
  Filled 2016-11-01: qty 10

## 2016-11-01 MED ORDER — ADULT MULTIVITAMIN W/MINERALS CH
1.0000 | ORAL_TABLET | Freq: Every day | ORAL | Status: DC
  Administered 2016-11-02 – 2016-11-04 (×3): 1 via ORAL
  Filled 2016-11-01 (×7): qty 1

## 2016-11-01 MED ORDER — TAMSULOSIN HCL 0.4 MG PO CAPS
0.4000 mg | ORAL_CAPSULE | Freq: Every day | ORAL | Status: DC
  Administered 2016-11-01 – 2016-11-03 (×3): 0.4 mg via ORAL
  Filled 2016-11-01 (×3): qty 1

## 2016-11-01 MED ORDER — MELOXICAM 15 MG PO TABS
15.0000 mg | ORAL_TABLET | Freq: Every day | ORAL | Status: DC
  Administered 2016-11-02 – 2016-11-04 (×3): 15 mg via ORAL
  Filled 2016-11-01 (×3): qty 1

## 2016-11-01 MED ORDER — SODIUM CHLORIDE 0.9 % IV BOLUS (SEPSIS)
500.0000 mL | Freq: Once | INTRAVENOUS | Status: AC
  Administered 2016-11-01: 500 mL via INTRAVENOUS

## 2016-11-01 MED ORDER — SENNOSIDES-DOCUSATE SODIUM 8.6-50 MG PO TABS: 1.0000 | ORAL_TABLET | Freq: Every evening | ORAL | Status: DC | PRN

## 2016-11-01 MED ORDER — TRAMADOL HCL 50 MG PO TABS
50.0000 mg | ORAL_TABLET | Freq: Two times a day (BID) | ORAL | Status: DC | PRN
  Administered 2016-11-02 – 2016-11-03 (×2): 50 mg via ORAL
  Filled 2016-11-01 (×2): qty 1

## 2016-11-01 MED ORDER — ENOXAPARIN SODIUM 40 MG/0.4ML ~~LOC~~ SOLN
40.0000 mg | SUBCUTANEOUS | Status: DC
  Administered 2016-11-01 – 2016-11-03 (×3): 40 mg via SUBCUTANEOUS
  Filled 2016-11-01 (×3): qty 0.4

## 2016-11-01 MED ORDER — SODIUM CHLORIDE 0.9% FLUSH
3.0000 mL | Freq: Two times a day (BID) | INTRAVENOUS | Status: DC
  Administered 2016-11-01 – 2016-11-02 (×3): 3 mL via INTRAVENOUS

## 2016-11-01 MED ORDER — PANTOPRAZOLE SODIUM 40 MG PO TBEC
40.0000 mg | DELAYED_RELEASE_TABLET | Freq: Every day | ORAL | Status: DC
  Administered 2016-11-02 – 2016-11-04 (×3): 40 mg via ORAL
  Filled 2016-11-01 (×4): qty 1

## 2016-11-01 MED ORDER — OMEGA-3-ACID ETHYL ESTERS 1 G PO CAPS
1.0000 g | ORAL_CAPSULE | Freq: Every day | ORAL | Status: DC
  Administered 2016-11-02 – 2016-11-04 (×3): 1 g via ORAL
  Filled 2016-11-01 (×4): qty 1

## 2016-11-01 MED ORDER — KETOROLAC TROMETHAMINE 15 MG/ML IJ SOLN
30.0000 mg | Freq: Four times a day (QID) | INTRAMUSCULAR | Status: DC | PRN
  Administered 2016-11-01 – 2016-11-02 (×2): 30 mg via INTRAVENOUS
  Filled 2016-11-01 (×2): qty 2

## 2016-11-01 NOTE — ED Notes (Signed)
Lactic Acid= 2.64, EDP Yao notified

## 2016-11-01 NOTE — ED Provider Notes (Signed)
[place] DEPT Provider Note   CSN: [id number] Arrival date & time: 11/01/16  1351  By signing my name below, I, Andrew Foster, attest that this documentation has been prepared under the direction and in the presence of [employer], PA-C. Electronically Signed: Sonum Foster, Education administrator. 11/01/16. 2:30 PM. History   Chief Complaint Chief Complaint  Patient presents with  . Flank Pain    The history is provided by the patient. No language interpreter was used.     HPI Comments: Andrew Foster is a 76 y.o. male brought in by ambulance, who presents to the Emergency Department complaining of an episode of constant bilateral flank pain with uncontrollable shaking.  He notes associated dysuria, decreased urinary output, and urinary frequency since yesterday. He states the flank pain resolved after EMS palpated/massaged the affected area. He reports a history of kidney stones but states this is not as severe as prior kidney stone related episodes. He denies fever, abdominal pain, hematuria, testicular pain, penile pain, scrotal fullness or swelling. Denies respiratory symptoms.   Past Medical History:  Diagnosis Date  . BPH (benign prostatic hyperplasia)   . Diverticulosis     Patient Active Problem List   Diagnosis Date Noted  . Sepsis ([place]) 11/01/2016  . Acute pyelonephritis 11/01/2016  . UTI (urinary tract infection) 11/01/2016  . Groin pain 05/01/2016  . Routine general medical examination at a health care facility 09/14/2015  . Elevated alkaline phosphatase level 10/19/2014  . Mixed hyperlipidemia 02/28/2013  . INSOMNIA-SLEEP DISORDER-UNSPEC 03/13/2010  . ERECTILE DYSFUNCTION, ORGANIC 02/04/2010  . ELEVATED PROSTATE SPECIFIC ANTIGEN 11/06/2008  . HYPERPLASIA PROSTATE UNS W/UR OBST & OTH LUTS 09/13/2008  . CARPAL TUNNEL SYNDROME 05/01/2008  . Esophageal reflux 05/01/2008  . DIVERTICULOSIS, COLON 05/01/2008  . SHOULDER IMPINGEMENT SYNDROME, RIGHT 05/01/2008  . HYPERGLYCEMIA,  FASTING 05/01/2008  . NONSPECIFIC ABNORMAL ELECTROCARDIOGRAM 05/01/2008  . VERTIGO 05/07/2007    Past Surgical History:  Procedure Laterality Date  . CARPAL TUNNEL RELEASE    . COLONOSCOPY     Tics; [facility]  . HERNIA REPAIR    . intra articular steroids     R shoulder  . PROSTATE BIOPSY     Dr Janice Norrie  . VASECTOMY         Home Medications    Prior to Admission medications   Medication Sig Start Date End Date Taking? Authorizing Provider  aspirin 325 MG tablet Take 650 mg by mouth daily.   Yes Historical Provider, MD  aspirin 81 MG tablet Take 81 mg by mouth daily.   Yes Historical Provider, MD  loratadine (CLARITIN) 10 MG tablet Take 1 tablet (10 mg total) by mouth daily. Patient taking differently: Take 10 mg by mouth daily. Takes 3 times weekly (MWF) 03/09/15  Yes Hendricks Limes, MD  meclizine (ANTIVERT) 12.5 MG tablet TAKE ONE TABLET BY MOUTH THREE TIMES DAILY AS NEEDED FOR DIZZINESS Patient taking differently: Take one tablet 3 times weekly (T, TH, Sat) 10/23/16  Yes Hoyt Koch, MD  meloxicam (MOBIC) 15 MG tablet TAKE ONE TABLET BY MOUTH ONCE DAILY 08/18/16  Yes Hoyt Koch, MD  Multiple Vitamin (MULTIVITAMIN) tablet Take 1 tablet by mouth daily.    Yes Historical Provider, MD  Omega-3 Fatty Acids (FISH OIL PO) Take 1 capsule by mouth daily.   Yes Historical Provider, MD  omeprazole (PRILOSEC OTC) 20 MG tablet Take 20 mg by mouth daily.    Yes Historical Provider, MD  tamsulosin (FLOMAX) 0.4 MG CAPS capsule TAKE  ONE CAPSULE BY MOUTH AT BEDTIME 02/14/16  Yes Hoyt Koch, MD  traMADol (ULTRAM) 50 MG tablet Take 1 tablet (50 mg total) by mouth every 12 (twelve) hours as needed. This is a 30 day supply, needs visit for refills. Patient taking differently: Take 50 mg by mouth daily. This is a 30 day supply, needs visit for refills. 10/23/16  Yes Hoyt Koch, MD  fluticasone (FLONASE) 50 MCG/ACT nasal spray Place 2 sprays into both nostrils  daily. Patient not taking: Reported on 05/01/2016 09/13/15   Hoyt Koch, MD  loratadine (CLARITIN) 10 MG tablet TAKE ONE TABLET BY MOUTH IN THE MORNING Patient not taking: Reported on 11/01/2016 09/22/16   Hoyt Koch, MD  valACYclovir (VALTREX) 1000 MG tablet Take 1 tablet (1,000 mg total) by mouth 2 (two) times daily. Patient not taking: Reported on 05/01/2016 10/18/14   Hendricks Limes, MD    Family History Family History  Problem Relation Age of Onset  . Heart attack Mother 71  . Diabetes Mother   . Diabetes Sister     TWO sisters  . Diabetes Brother     TWO brothers  . Diabetes type I Brother   . Stroke Neg Hx   . Cancer Neg Hx     Social History Social History  Substance Use Topics  . Smoking status: Former Smoker    Quit date: 09/01/1974  . Smokeless tobacco: Not on file     Comment: smoked 1956-1976, up to 3 ppd  . Alcohol use No     Allergies   Patient has no known allergies.   Review of Systems Review of Systems  Gastrointestinal: Negative for abdominal pain.  Genitourinary: Positive for decreased urine volume, dysuria, flank pain and frequency. Negative for hematuria, penile pain, scrotal swelling and testicular pain.  Neurological: Positive for tremors.  All other systems reviewed and are negative.    Physical Exam Updated Vital Signs BP 128/94   Pulse 113   Temp 101.7 F (38.7 C) (Rectal)   Resp 15   Ht 5\' 8"  (1.727 m)   Wt 79.4 kg   SpO2 94%   BMI 26.61 kg/m   Physical Exam  Constitutional: He is oriented to person, place, and time. He appears well-developed and well-nourished.  HENT:  Head: Normocephalic and atraumatic.  Cardiovascular: Regular rhythm.   Pulmonary/Chest: Effort normal and breath sounds normal. No respiratory distress. He has no wheezes. He has no rales. He exhibits no tenderness.  Abdominal: Soft. He exhibits no distension and no mass. There is no tenderness. There is no rebound and no guarding. No hernia.   No CVA tenderness  Neurological: He is alert and oriented to person, place, and time.  Skin: Skin is warm and dry.  Psychiatric: He has a normal mood and affect.  Nursing note and vitals reviewed.    ED Treatments / Results  DIAGNOSTIC STUDIES:   COORDINATION OF CARE: 2:30 PM Discussed treatment plan with pt at bedside and pt agreed to plan.   Labs (all labs ordered are listed, but only abnormal results are displayed) Labs Reviewed  CBC WITH DIFFERENTIAL/PLATELET - Abnormal; Notable for the following:       Result Value   WBC 14.7 (*)    Neutro Abs 13.8 (*)    All other components within normal limits  COMPREHENSIVE METABOLIC PANEL - Abnormal; Notable for the following:    Glucose, Bld 103 (*)    Total Bilirubin 1.3 (*)    All  other components within normal limits  URINALYSIS, ROUTINE W REFLEX MICROSCOPIC - Abnormal; Notable for the following:    APPearance HAZY (*)    Leukocytes, UA MODERATE (*)    Bacteria, UA FEW (*)    Squamous Epithelial / LPF 0-5 (*)    All other components within normal limits  I-STAT CG4 LACTIC ACID, ED - Abnormal; Notable for the following:    Lactic Acid, Venous 2.64 (*)    All other components within normal limits  URINE CULTURE  CULTURE, BLOOD (ROUTINE X 2)  CULTURE, BLOOD (ROUTINE X 2)  INFLUENZA PANEL BY PCR (TYPE A & B)  I-STAT TROPOININ, ED  I-STAT CG4 LACTIC ACID, ED    EKG  EKG Interpretation  Date/Time:  Saturday November 01 2016 14:45:19 EST Ventricular Rate:  102 PR Interval:    QRS Duration: 95 QT Interval:  319 QTC Calculation: 416 R Axis:   144 Text Interpretation:  Fast sinus arrhythmia Probable RVH w/ secondary repol abnormality rate faster since previous  Confirmed by YAO  MD, DAVID ([id number]) on 11/01/2016 2:48:40 PM       Radiology Dg Chest 2 View  Result Date: 11/01/2016 CLINICAL DATA:  Shortness of breath. EXAM: CHEST  2 VIEW COMPARISON:  Radiograph March 01, 2010. FINDINGS: The heart size and mediastinal contours  are within normal limits. Both lungs are clear. No pneumothorax or pleural effusion is noted. Deformity of right humeral head is noted consistent with old fracture. IMPRESSION: No active cardiopulmonary disease. Electronically Signed   By: Marijo Conception, M.D.   On: 11/01/2016 15:59   Ct Renal Stone Study  Result Date: 11/01/2016 CLINICAL DATA:  constant bilateral flank pain with uncontrollable . He notes associated dysuria, decreased urinary output, and urinary frequency since yesterday. history of kidney stones Hernia repair and hx diverticulitis EXAM: CT ABDOMEN AND PELVIS WITHOUT CONTRAST TECHNIQUE: Multidetector CT imaging of the abdomen and pelvis was performed following the standard protocol without IV contrast. COMPARISON:  05/21/2016 FINDINGS: Lower chest: Atherosclerosis of the aortic root. Heart size is normal. There is mild scarring at the lung bases. Hepatobiliary: No focal liver abnormality is seen. No gallstones, gallbladder wall thickening, or biliary dilatation. Pancreas: Unremarkable. No pancreatic ductal dilatation or surrounding inflammatory changes. Spleen: Normal in size without focal abnormality. Adrenals/Urinary Tract: Adrenal glands are unremarkable in appearance. Right renal cyst is 1.8 cm. There is a 2 mm calcification in the lower pole of the left kidney. No hydronephrosis. No ureteral stones. Stomach/Bowel: The stomach and small bowel loops are normal in appearance. The appendix is well seen and has a normal appearance. Significant colonic diverticulosis. No acute inflammatory changes related to the diverticulosis. Note is made of a left inguinal hernia containing mesenteric fat and a single sigmoid diverticulum. However there is no associated inflammation or perforation. No associated bowel obstruction. Vascular/Lymphatic: There is atherosclerotic calcification of the abdominal aorta and its branches. No aneurysm. No retroperitoneal or mesenteric adenopathy. Reproductive: Prostate  is enlarged and partially calcified. Other: Small supraumbilical fat containing hernia. Left inguinal hernia, described above. Fat containing right inguinal hernia. Musculoskeletal: There are degenerative changes in the thoracolumbar spine. Degenerative retrolisthesis of 7 mm identified at L1-2. IMPRESSION: 1. Significant colonic diverticulosis. A colonic diverticulum is located within the left inguinal hernia but not associated with acute inflammation or obstruction. 2. Small fat containing right inguinal hernia. 3. Small supraumbilical fat containing hernia. 4. Atherosclerosis of the aortic root. 5. Nonobstructing left lower pole renal calculus. No urinary tract obstruction. 6.  Normal appendix. 7. Prostatic enlargement. 8. Degenerative changes in the spine. Grade 1 retrolisthesis of L1 on L2. Electronically Signed   By: Nolon Nations M.D.   On: 11/01/2016 15:44    Procedures Procedures (including critical care time)  CRITICAL CARE Performed by: Elmer Ramp   Total critical care time: 35 minutes  Critical care time was exclusive of separately billable procedures and treating other patients.  Critical care was necessary to treat or prevent imminent or life-threatening deterioration.  Critical care was time spent personally by me on the following activities: development of treatment plan with patient and/or surrogate as well as nursing, discussions with consultants, evaluation of patient's response to treatment, examination of patient, obtaining history from patient or surrogate, ordering and performing treatments and interventions, ordering and review of laboratory studies, ordering and review of radiographic studies, pulse oximetry and re-evaluation of patient's condition.  Medications Ordered in ED Medications  sodium chloride 0.9 % bolus 1,000 mL (0 mLs Intravenous Stopped 11/01/16 1600)    And  sodium chloride 0.9 % bolus 1,000 mL (0 mLs Intravenous Stopped 11/01/16 1623)    And   sodium chloride 0.9 % bolus 500 mL (0 mLs Intravenous Stopped 11/01/16 1602)  cefTRIAXone (ROCEPHIN) 1 g in dextrose 5 % 50 mL IVPB (0 g Intravenous Stopped 11/01/16 1600)  acetaminophen (TYLENOL) tablet 650 mg (650 mg Oral Given 11/01/16 1528)     Initial Impression / Assessment and Plan / ED Course  I have reviewed the triage vital signs and the nursing notes.  Pertinent labs & imaging results that were available during my care of the patient were reviewed by me and considered in my medical decision making (see chart for details).      Final Clinical Impressions(s) / ED Diagnoses   Final diagnoses:  Sepsis, due to unspecified organism [facility])  Urinary tract infection without hematuria, site unspecified     Labs: Influenza negative, i-STAT lactic 2.64 urine moderate leukocytes to numerous white blood cells to count, WBC 14.7  Imaging: DG Chest 2 View, CT renal stone study  Consults: [facility]  Therapeutics: Ceftriaxone, normal saline  Discharge Meds:   Assessment/Plan: Admit  76 year old male presents today with sepsis.  Patient had relatively acute onset of symptoms.  Urinary symptoms last night followed by sudden onset chills today.  Patient's presentation significant for tachycardia, fever, elevated lactic acid, elevated WBC and urinalysis consistent with urinary tract infection.  Patient does not have any upper respiratory symptoms and has a negative chest x-ray.  No other source of infection identified on evaluation.  Patient was started on sepsis protocol with weight-based fluids, coverage with ceftriaxone for urinary tract infection.  CT scan shows no obstructive pathology.  Patient's case was discussed with [facility] hospitalist who agreed for hospital admission.  Patient care was shared with Dr. Dominic Pea.    New Prescriptions New Prescriptions   No medications on file   I personally performed the services described in this documentation, which was scribed in my presence. The recorded  information has been reviewed and is accurate.   Okey Regal, PA-C 11/01/16 Dalton Yao, MD 11/01/16 [phone]

## 2016-11-01 NOTE — ED Notes (Signed)
Code sepsis called by Dr Darl Householder.

## 2016-11-01 NOTE — ED Notes (Signed)
EKG given to Dr. Yao.  

## 2016-11-01 NOTE — ED Triage Notes (Addendum)
Per EMS, pt complains of bilateral flank pain and decreased urine output since 11AM today. Pain subsided prior to arrival.   Pt had fever of 103.

## 2016-11-01 NOTE — Progress Notes (Signed)
Pt. arrived to floor from ED via stretcher. Alert and oriented x 4 no respiratory distress noted.

## 2016-11-01 NOTE — ED Notes (Signed)
Bladder scan is at 200

## 2016-11-01 NOTE — H&P (Signed)
TRH H&P   Patient Demographics:    Andrew Foster, is a 76 y.o. male  MRN: [id number]   DOB - 1941/04/02  Admit Date - 11/01/2016  Outpatient Primary MD for the patient is Andrew Koch, MD  Referring MD: Dr. Darl Foster  Outpatient Specialists: None   Patient coming from: Home  Chief Complaint  Patient presents with  . Flank Pain      HPI:    Andrew Foster  is a 76 y.o. male, Fairly healthy with history of BPH, shoulder impingement, vertigo who presented to the ED with 1 day history of severe bilateral flank pain associated with dysuria and poor urine output. Symptoms started yesterday when he had bilateral flank pain, involving the lower back. This morning he felt more uncomfortable with some dysuria and felt like he was having poor urine output but increasing urinary frequency. Denies hematuria. By lunchtime he was extremely weak and tremulous. (Reported uncontrollable shaking). He also reported some earache. He denies any headache, dizziness, fevers, chills, nausea, vomiting, chest pain, shortness of breath, palpitations, epigastric pain, diarrhea,  arthralgia or confusion. Denies any sick contact or recent travel. Denies any new medications.  He had UTI in 2016 with pansensitive Escherichia coli.  Patient brought to the ED by ambulance. In the ED he was found to be septic with fever of 140s Fahrenheit, tachycardic to 110s, tachypneic with normal blood pressure and O2 sat. Blood work showed WBC of 14.7 K, normal electrolytes and elevated lactic acid of 2.6. Sepsis pathway initiated in the ED and received 3 L IV normal saline bolus. UA was positive for UTI. Chest x-ray unremarkable. Flu PCR was negative and CT renal study showed nonobstructing left lower pole renal calculus , no hydronephrosis. Also showed colonic diverticulosis and enlarged prostate.  Patient received IV  Rocephin and admitted to stepdown for sepsis likely due to urinary source.  On my exam patient reports his back pain to have resolved.   Review of systems:    In addition to the HPI above, ( positive symptoms in bold) No Fever-chills, Rigors++ No Headache, No changes with Vision or hearing, No problems swallowing food or Liquids, No Chest pain, Cough or Shortness of Breath, No Abdominal pain, No Nausea or Vomiting, Bowel movements are regular, No Blood in stool or Urine, Dysuria++, bilateral flank pain +++ No new skin rashes or bruises, No new joints pains-aches,  No new weakness, tingling, numbness in any extremity, No recent weight gain or loss, No polyuria, polydypsia or polyphagia, No significant Mental Stressors.  A full 10 point Review of Systems was done, except as stated above, all other Review of Systems were negative.   With Past History of the following :    Past Medical History:  Diagnosis Date  . BPH (benign prostatic hyperplasia)   . Diverticulosis       Past Surgical History:  Procedure Laterality Date  . CARPAL TUNNEL RELEASE    . COLONOSCOPY     Tics; [facility]  . HERNIA REPAIR    . intra articular steroids     R shoulder  . PROSTATE BIOPSY     Dr Andrew Foster  . VASECTOMY        Social History:     Social History  Substance Use Topics  . Smoking status: Former Smoker    Quit date: 09/01/1974  . Smokeless tobacco: Not on file     Comment: smoked 1956-1976, up to 3 ppd  . Alcohol use No     Lives - Home alone  Mobility - independent    Family History :     Family History  Problem Relation Age of Onset  . Heart attack Mother 52  . Diabetes Mother   . Diabetes Sister     TWO sisters  . Diabetes Brother     TWO brothers  . Diabetes type I Brother   . Stroke Neg Hx   . Cancer Neg Hx       Home Medications:   Prior to Admission medications   Medication Sig Start Date End Date Taking? Authorizing Provider  aspirin 325 MG tablet  Take 650 mg by mouth daily.   Yes Historical Provider, MD  aspirin 81 MG tablet Take 81 mg by mouth daily.   Yes Historical Provider, MD  loratadine (CLARITIN) 10 MG tablet Take 1 tablet (10 mg total) by mouth daily. Patient taking differently: Take 10 mg by mouth daily. Takes 3 times weekly (MWF) 03/09/15  Yes Andrew Limes, MD  meclizine (ANTIVERT) 12.5 MG tablet TAKE ONE TABLET BY MOUTH THREE TIMES DAILY AS NEEDED FOR DIZZINESS Patient taking differently: Take one tablet 3 times weekly (T, TH, Sat) 10/23/16  Yes Andrew Koch, MD  meloxicam (MOBIC) 15 MG tablet TAKE ONE TABLET BY MOUTH ONCE DAILY 08/18/16  Yes Andrew Koch, MD  Multiple Vitamin (MULTIVITAMIN) tablet Take 1 tablet by mouth daily.    Yes Historical Provider, MD  Omega-3 Fatty Acids (FISH OIL PO) Take 1 capsule by mouth daily.   Yes Historical Provider, MD  omeprazole (PRILOSEC OTC) 20 MG tablet Take 20 mg by mouth daily.    Yes Historical Provider, MD  tamsulosin (FLOMAX) 0.4 MG CAPS capsule TAKE ONE CAPSULE BY MOUTH AT BEDTIME 02/14/16  Yes Andrew Koch, MD  traMADol (ULTRAM) 50 MG tablet Take 1 tablet (50 mg total) by mouth every 12 (twelve) hours as needed. This is a 30 day supply, needs visit for refills. Patient taking differently: Take 50 mg by mouth daily. This is a 30 day supply, needs visit for refills. 10/23/16  Yes Andrew Koch, MD  fluticasone (FLONASE) 50 MCG/ACT nasal spray Place 2 sprays into both nostrils daily. Patient not taking: Reported on 05/01/2016 09/13/15   Andrew Koch, MD  loratadine (CLARITIN) 10 MG tablet TAKE ONE TABLET BY MOUTH IN THE MORNING Patient not taking: Reported on 11/01/2016 09/22/16   Andrew Koch, MD  valACYclovir (VALTREX) 1000 MG tablet Take 1 tablet (1,000 mg total) by mouth 2 (two) times daily. Patient not taking: Reported on 05/01/2016 10/18/14   Andrew Limes, MD     Allergies:    No Known Allergies   Physical Exam:   Vitals  Blood  pressure 128/94, pulse 113, temperature 101.7 F (38.7 C), temperature source Rectal, resp. rate 15, height 5\' 8"  (1.727 m), weight 79.4 kg (175  lb), SpO2 94 %.   Gen.: Elderly male lying in bed not in distress, diaphoretic HEENT: No pallor, moist mucosa, supple neck Chest: Clear to auscultation bilaterally CVS:  S1 and S2 tachycardic, no murmurs rub or gallop GI: Soft, nondistended, no abdominal tenderness, bowel sounds present, no flank tenderness Musculoskeletal: Warm, no edema,  CNS: Alert and oriented   Data Review:    CBC  Recent Labs Lab 11/01/16 1452  WBC 14.7*  HGB 14.8  HCT 43.0  PLT 191  MCV 93.3  MCH 32.1  MCHC 34.4  RDW 12.5  LYMPHSABS 0.7  MONOABS 0.1  EOSABS 0.0  BASOSABS 0.0   ------------------------------------------------------------------------------------------------------------------  Chemistries   Recent Labs Lab 11/01/16 1452  NA 137  K 3.6  CL 103  CO2 23  GLUCOSE 103*  BUN 18  CREATININE 0.75  CALCIUM 9.2  AST 31  ALT 24  ALKPHOS 50  BILITOT 1.3*   ------------------------------------------------------------------------------------------------------------------ estimated creatinine clearance is 76 mL/min (by C-G formula based on SCr of 0.75 mg/dL). ------------------------------------------------------------------------------------------------------------------ No results for input(s): TSH, T4TOTAL, T3FREE, THYROIDAB in the last 72 hours.  Invalid input(s): FREET3  Coagulation profile No results for input(s): INR, PROTIME in the last 168 hours. ------------------------------------------------------------------------------------------------------------------- No results for input(s): DDIMER in the last 72 hours. -------------------------------------------------------------------------------------------------------------------  Cardiac Enzymes No results for input(s): CKMB, TROPONINI, MYOGLOBIN in the last 168 hours.  Invalid  input(s): CK ------------------------------------------------------------------------------------------------------------------ No results found for: BNP   ---------------------------------------------------------------------------------------------------------------  Urinalysis    Component Value Date/Time   COLORURINE YELLOW 11/01/2016 1453   APPEARANCEUR HAZY (A) 11/01/2016 1453   LABSPEC 1.015 11/01/2016 1453   PHURINE 7.0 11/01/2016 1453   GLUCOSEU NEGATIVE 11/01/2016 1453   GLUCOSEU NEGATIVE 10/18/2014 0902   HGBUR NEGATIVE 11/01/2016 1453   HGBUR negative 09/13/2008 1135   BILIRUBINUR NEGATIVE 11/01/2016 1453   KETONESUR NEGATIVE 11/01/2016 1453   PROTEINUR NEGATIVE 11/01/2016 1453   UROBILINOGEN 0.2 10/18/2014 0902   NITRITE NEGATIVE 11/01/2016 1453   LEUKOCYTESUR MODERATE (A) 11/01/2016 1453    ----------------------------------------------------------------------------------------------------------------   Imaging Results:    Dg Chest 2 View  Result Date: 11/01/2016 CLINICAL DATA:  Shortness of breath. EXAM: CHEST  2 VIEW COMPARISON:  Radiograph March 01, 2010. FINDINGS: The heart size and mediastinal contours are within normal limits. Both lungs are clear. No pneumothorax or pleural effusion is noted. Deformity of right humeral head is noted consistent with old fracture. IMPRESSION: No active cardiopulmonary disease. Electronically Signed   By: Marijo Conception, M.D.   On: 11/01/2016 15:59   Ct Renal Stone Study  Result Date: 11/01/2016 CLINICAL DATA:  constant bilateral flank pain with uncontrollable . He notes associated dysuria, decreased urinary output, and urinary frequency since yesterday. history of kidney stones Hernia repair and hx diverticulitis EXAM: CT ABDOMEN AND PELVIS WITHOUT CONTRAST TECHNIQUE: Multidetector CT imaging of the abdomen and pelvis was performed following the standard protocol without IV contrast. COMPARISON:  05/21/2016 FINDINGS: Lower chest:  Atherosclerosis of the aortic root. Heart size is normal. There is mild scarring at the lung bases. Hepatobiliary: No focal liver abnormality is seen. No gallstones, gallbladder wall thickening, or biliary dilatation. Pancreas: Unremarkable. No pancreatic ductal dilatation or surrounding inflammatory changes. Spleen: Normal in size without focal abnormality. Adrenals/Urinary Tract: Adrenal glands are unremarkable in appearance. Right renal cyst is 1.8 cm. There is a 2 mm calcification in the lower pole of the left kidney. No hydronephrosis. No ureteral stones. Stomach/Bowel: The stomach and small bowel loops are normal in appearance. The appendix  is well seen and has a normal appearance. Significant colonic diverticulosis. No acute inflammatory changes related to the diverticulosis. Note is made of a left inguinal hernia containing mesenteric fat and a single sigmoid diverticulum. However there is no associated inflammation or perforation. No associated bowel obstruction. Vascular/Lymphatic: There is atherosclerotic calcification of the abdominal aorta and its branches. No aneurysm. No retroperitoneal or mesenteric adenopathy. Reproductive: Prostate is enlarged and partially calcified. Other: Small supraumbilical fat containing hernia. Left inguinal hernia, described above. Fat containing right inguinal hernia. Musculoskeletal: There are degenerative changes in the thoracolumbar spine. Degenerative retrolisthesis of 7 mm identified at L1-2. IMPRESSION: 1. Significant colonic diverticulosis. A colonic diverticulum is located within the left inguinal hernia but not associated with acute inflammation or obstruction. 2. Small fat containing right inguinal hernia. 3. Small supraumbilical fat containing hernia. 4. Atherosclerosis of the aortic root. 5. Nonobstructing left lower pole renal calculus. No urinary tract obstruction. 6. Normal appendix. 7. Prostatic enlargement. 8. Degenerative changes in the spine. Grade 1  retrolisthesis of L1 on L2. Electronically Signed   By: Nolon Nations M.D.   On: 11/01/2016 15:44    My personal review of EKG: Sinus tachycardia @102 , Possible RVH   Assessment & Plan:   Principal problem Sepsis ([place]) Likely secondary to UTI with acute pyelonephritis. Sepsis pathway initiated in the ED. Received take IV normal saline bolus. Urine and blood cultures sent from the ED.  Admit to telemetry. Continue empiric IV Rocephin. Follow repeat lactic acid. Supportive care with IV fluids, IV Toradol when necessary for pain, antiemetics and Tylenol.   BPH  continue Flomax.  Resume all home medications.  Diet: Regular   DVT Prophylaxis   Lovenox -   AM Labs Ordered, also please review Full Orders  Family Communication: Admission, patients condition and plan of care including tests being ordered have been discussed with the patient and *his sisters at bedside in detail.  Code Status:  full code  Likely DC to   home  Condition : fair  Consults called: none   Admission status: inpatient  Time spent in minutes : 70   Louellen Molder M.D on 11/01/2016 at 5:03 PM  Between 7am to 7pm - Pager - [phone]. After 7pm go to [email or web address] - password [email or web address]  [facility] - Office  [phone]

## 2016-11-01 NOTE — Progress Notes (Signed)
Pharmacy Antibiotic Follow-up Note  Andrew Foster is a 76 y.o. year-old male admitted on 11/01/2016.  The patient is currently on day 1 of Rocephin for UTI.  Assessment/Plan: This patient's current antibiotics will be continued without adjustments.  Pharmacy will sign off protocol  Temp (24hrs), Avg:101.7 F (38.7 C), Min:99.3 F (37.4 C), Max:104 F (40 C)   Recent Labs Lab 11/01/16 1452  WBC 14.7*    Recent Labs Lab 11/01/16 1452  CREATININE 0.75   Estimated Creatinine Clearance: 76 mL/min (by C-G formula based on SCr of 0.75 mg/dL).    No Known Allergies  Antimicrobials this admission: 3/3 Rocephin >>   Microbiology results: 3/3 BCx: sent 3/3 UCx: sent   Thank you for allowing pharmacy to be a part of this patient's care.  Minda Ditto PharmD 11/01/2016 7:41 PM

## 2016-11-02 ENCOUNTER — Encounter (HOSPITAL_COMMUNITY): Payer: Self-pay | Admitting: *Deleted

## 2016-11-02 DIAGNOSIS — R7881 Bacteremia: Secondary | ICD-10-CM

## 2016-11-02 DIAGNOSIS — A4150 Gram-negative sepsis, unspecified: Secondary | ICD-10-CM

## 2016-11-02 DIAGNOSIS — N10 Acute tubulo-interstitial nephritis: Secondary | ICD-10-CM

## 2016-11-02 LAB — BASIC METABOLIC PANEL
Anion gap: 6 (ref 5–15)
BUN: 18 mg/dL (ref 6–20)
CALCIUM: 8 mg/dL — AB (ref 8.9–10.3)
CHLORIDE: 105 mmol/L (ref 101–111)
CO2: 24 mmol/L (ref 22–32)
CREATININE: 0.68 mg/dL (ref 0.61–1.24)
GFR calc non Af Amer: >60 mL/min (ref >60)
Glucose, Bld: 108 mg/dL — ABNORMAL HIGH (ref 65–99)
Potassium: 3.6 mmol/L (ref 3.5–5.1)
SODIUM: 135 mmol/L (ref 135–145)

## 2016-11-02 LAB — BLOOD CULTURE ID PANEL (REFLEXED)

## 2016-11-02 LAB — CBC
HCT: 39.6 % (ref 39.0–52.0)
HEMOGLOBIN: 13.5 g/dL (ref 13.0–17.0)
MCH: 32.2 pg (ref 26.0–34.0)
MCHC: 34.1 g/dL (ref 30.0–36.0)
MCV: 94.5 fL (ref 78.0–100.0)
Platelets: 158 10*3/uL (ref 150–400)
RBC: 4.19 MIL/uL — ABNORMAL LOW (ref 4.22–5.81)
RDW: 13.2 % (ref 11.5–15.5)
WBC: 21.8 10*3/uL — ABNORMAL HIGH (ref 4.0–10.5)

## 2016-11-02 MED ORDER — SODIUM CHLORIDE 0.9 % IV SOLN
1.0000 g | Freq: Three times a day (TID) | INTRAVENOUS | Status: DC
  Administered 2016-11-02 – 2016-11-04 (×6): 1 g via INTRAVENOUS
  Filled 2016-11-02 (×8): qty 1

## 2016-11-02 MED ORDER — POTASSIUM CHLORIDE IN NACL 20-0.9 MEQ/L-% IV SOLN
INTRAVENOUS | Status: DC
  Administered 2016-11-02 – 2016-11-04 (×2): via INTRAVENOUS
  Filled 2016-11-02 (×5): qty 1000

## 2016-11-02 MED ORDER — SODIUM CHLORIDE 0.9 % IV SOLN: INTRAVENOUS | Status: DC

## 2016-11-02 NOTE — Progress Notes (Signed)
PROGRESS NOTE  Andrew Foster [id number] DOB: December 04, 1940 DOA: 11/01/2016 PCP: Hoyt Koch, MD  Brief History:  76 year old male with a history of BPH and diverticulosis presented with one-day history of dysuria and bilateral flank pain with increasing urinary frequency. The patient also endorsed fevers and chills and rigors with associated generalized weakness. He had a temperature 102.81F at home.  He denied any nausea, vomiting, diarrhea, chest pain or shortness breath, coughing, hemoptysis, headache, neck pain. Upon presentation, the patient was noted to have WBC 14.7 with lactic acid 2.64. Urinalysis showed TNTC WBC. The patient was started on intravenous fluids and ceftriaxone for sepsis secondary to urinary source.  Assessment/Plan: Sepsis -Secondary to bacteremia with urinary source -WBC increased in the past 24 hours -Broaden antibiotic coverage pending culture data -Start Merrem -Discontinue ceftriaxone 1 g daily -Continue IV fluids with K -Lactic acid 2.64>>> 1.68 -Chest x-ray negative  Bacteremia--GNR -Start Merrem pending culture data  Pyelonephritis -Start Merrem pending culture data -11/01/2016 CT abdomen and pelvis--colonic diverticulosis, nonobstructive left renal pole calculus, L1-2 retrolisthesis  Chronic back pain -Continue Mobic and tramadol    Disposition Plan:   Home in 1-2 days  Family Communication:   Son updated at bedside  Consultants:  none  Code Status:  FULL  DVT Prophylaxis: [place] Lovenox   Procedures: As Listed in Progress Note Above  Antibiotics: Ceftriaxone 11/01/16>>>3/4 Merrem 3/4>>>    Subjective: His flank pain and dysuria are improving.  Patient denies fevers, chills, headache, chest pain, dyspnea, nausea, vomiting, diarrhea, abdominal pain, hematuria, hematochezia, and melena.    Objective: Vitals:   11/01/16 1837 11/01/16 1905 11/01/16 2120 11/02/16 0904  BP: 105/68 108/65 (!) 95/57 106/87  Pulse:   (!) 104 (!) 104   Resp:  15 15   Temp:  99.3 F (37.4 C) 98.5 F (36.9 C)   TempSrc:   Oral   SpO2:  94% 93%   Weight:      Height:        Intake/Output Summary (Last 24 hours) at 11/02/16 0936 Last data filed at 11/02/16 0900  Gross per 24 hour  Intake          1698.33 ml  Output              302 ml  Net          1396.33 ml   Weight change:  Exam:   General:  Pt is alert, follows commands appropriately, not in acute distress  HEENT: No icterus, No thrush, No neck mass, [place]/AT  Cardiovascular: RRR, S1/S2, no rubs, no gallops  Respiratory: Bibasilar crackles, no wheezing, no crackles, no rhonchi  Abdomen: Soft/+BS, non tender, non distended, no guarding  Extremities: No edema, No lymphangitis, No petechiae, No rashes, no synovitis   Data Reviewed: I have personally reviewed following labs and imaging studies Basic Metabolic Panel:  Recent Labs Lab 11/01/16 1452 11/02/16 0558  NA 137 135  K 3.6 3.6  CL 103 105  CO2 23 24  GLUCOSE 103* 108*  BUN 18 18  CREATININE 0.75 0.68  CALCIUM 9.2 8.0*   Liver Function Tests:  Recent Labs Lab 11/01/16 1452  AST 31  ALT 24  ALKPHOS 50  BILITOT 1.3*  PROT 6.8  ALBUMIN 4.2   No results for input(s): LIPASE, AMYLASE in the last 168 hours. No results for input(s): AMMONIA in the last 168 hours. Coagulation Profile: No results for input(s): INR, PROTIME in  the last 168 hours. CBC:  Recent Labs Lab 11/01/16 1452 11/02/16 0558  WBC 14.7* 21.8*  NEUTROABS 13.8*  --   HGB 14.8 13.5  HCT 43.0 39.6  MCV 93.3 94.5  PLT 191 158   Cardiac Enzymes: No results for input(s): CKTOTAL, CKMB, CKMBINDEX, TROPONINI in the last 168 hours. BNP: Invalid input(s): POCBNP CBG:  Recent Labs Lab 11/01/16 2015  GLUCAP 102*   HbA1C: No results for input(s): HGBA1C in the last 72 hours. Urine analysis:    Component Value Date/Time   COLORURINE YELLOW 11/01/2016 1453   APPEARANCEUR HAZY (A) 11/01/2016 1453   LABSPEC  1.015 11/01/2016 1453   PHURINE 7.0 11/01/2016 1453   GLUCOSEU NEGATIVE 11/01/2016 1453   GLUCOSEU NEGATIVE 10/18/2014 0902   HGBUR NEGATIVE 11/01/2016 1453   HGBUR negative 09/13/2008 1135   BILIRUBINUR NEGATIVE 11/01/2016 1453   KETONESUR NEGATIVE 11/01/2016 1453   PROTEINUR NEGATIVE 11/01/2016 1453   UROBILINOGEN 0.2 10/18/2014 0902   NITRITE NEGATIVE 11/01/2016 1453   LEUKOCYTESUR MODERATE (A) 11/01/2016 1453   Sepsis Labs: @LABRCNTIP (procalcitonin:4,lacticidven:4) ) Recent Results (from the past 240 hour(s))  Blood Culture (routine x 2)     Status: None (Preliminary result)   Collection Time: 11/01/16  2:50 PM  Result Value Ref Range Status   Specimen Description BLOOD LEFT FOREARM  Final   Special Requests BOTTLES DRAWN AEROBIC AND ANAEROBIC 10 CC  Final   Culture  Setup Time   Final    GRAM NEGATIVE RODS AEROBIC BOTTLE ONLY Organism ID to follow Performed at [facility], [place] [address], [place], [place] [address]    Culture GRAM NEGATIVE RODS  Final   Report Status PENDING  Incomplete     Scheduled Meds: . aspirin EC  81 mg Oral Daily  . enoxaparin (LOVENOX) injection  40 mg Subcutaneous Q24H  . [START ON 11/03/2016] loratadine  10 mg Oral Once per day on Mon Wed Fri  . meloxicam  15 mg Oral Daily  . meropenem (MERREM) IV  1 g Intravenous Q8H  . multivitamin with minerals  1 tablet Oral Daily  . omega-3 acid ethyl esters  1 g Oral Daily  . pantoprazole  40 mg Oral Daily  . sodium chloride flush  3 mL Intravenous Q12H  . tamsulosin  0.4 mg Oral QHS   Continuous Infusions: . sodium chloride 100 mL/hr at 11/01/16 2049    Procedures/Studies: Dg Chest 2 View  Result Date: 11/01/2016 CLINICAL DATA:  Shortness of breath. EXAM: CHEST  2 VIEW COMPARISON:  Radiograph March 01, 2010. FINDINGS: The heart size and mediastinal contours are within normal limits. Both lungs are clear. No pneumothorax or pleural effusion is noted. Deformity of right humeral head is noted  consistent with old fracture. IMPRESSION: No active cardiopulmonary disease. Electronically Signed   By: Marijo Conception, M.D.   On: 11/01/2016 15:59   Ct Renal Stone Study  Result Date: 11/01/2016 CLINICAL DATA:  constant bilateral flank pain with uncontrollable . He notes associated dysuria, decreased urinary output, and urinary frequency since yesterday. history of kidney stones Hernia repair and hx diverticulitis EXAM: CT ABDOMEN AND PELVIS WITHOUT CONTRAST TECHNIQUE: Multidetector CT imaging of the abdomen and pelvis was performed following the standard protocol without IV contrast. COMPARISON:  05/21/2016 FINDINGS: Lower chest: Atherosclerosis of the aortic root. Heart size is normal. There is mild scarring at the lung bases. Hepatobiliary: No focal liver abnormality is seen. No gallstones, gallbladder wall thickening, or biliary dilatation. Pancreas: Unremarkable. No  pancreatic ductal dilatation or surrounding inflammatory changes. Spleen: Normal in size without focal abnormality. Adrenals/Urinary Tract: Adrenal glands are unremarkable in appearance. Right renal cyst is 1.8 cm. There is a 2 mm calcification in the lower pole of the left kidney. No hydronephrosis. No ureteral stones. Stomach/Bowel: The stomach and small bowel loops are normal in appearance. The appendix is well seen and has a normal appearance. Significant colonic diverticulosis. No acute inflammatory changes related to the diverticulosis. Note is made of a left inguinal hernia containing mesenteric fat and a single sigmoid diverticulum. However there is no associated inflammation or perforation. No associated bowel obstruction. Vascular/Lymphatic: There is atherosclerotic calcification of the abdominal aorta and its branches. No aneurysm. No retroperitoneal or mesenteric adenopathy. Reproductive: Prostate is enlarged and partially calcified. Other: Small supraumbilical fat containing hernia. Left inguinal hernia, described above. Fat  containing right inguinal hernia. Musculoskeletal: There are degenerative changes in the thoracolumbar spine. Degenerative retrolisthesis of 7 mm identified at L1-2. IMPRESSION: 1. Significant colonic diverticulosis. A colonic diverticulum is located within the left inguinal hernia but not associated with acute inflammation or obstruction. 2. Small fat containing right inguinal hernia. 3. Small supraumbilical fat containing hernia. 4. Atherosclerosis of the aortic root. 5. Nonobstructing left lower pole renal calculus. No urinary tract obstruction. 6. Normal appendix. 7. Prostatic enlargement. 8. Degenerative changes in the spine. Grade 1 retrolisthesis of L1 on L2. Electronically Signed   By: Nolon Nations M.D.   On: 11/01/2016 15:44    Kaiden Pech, DO  [facility] Pager [phone]  If 7PM-7AM, please contact night-coverage [email or web address] Password [email or web address] 11/02/2016, 9:36 AM   LOS: 1 day

## 2016-11-02 NOTE — Progress Notes (Signed)
PHARMACY - PHYSICIAN COMMUNICATION CRITICAL VALUE ALERT - BLOOD CULTURE IDENTIFICATION (BCID)  Results for orders placed or performed during the hospital encounter of 11/01/16  Blood Culture ID Panel (Reflexed) (Collected: 11/01/2016  2:50 PM)  Result Value Ref Range   Enterococcus species NOT DETECTED NOT DETECTED   Listeria monocytogenes NOT DETECTED NOT DETECTED   Staphylococcus species NOT DETECTED NOT DETECTED   Staphylococcus aureus NOT DETECTED NOT DETECTED   Streptococcus species NOT DETECTED NOT DETECTED   Streptococcus agalactiae NOT DETECTED NOT DETECTED   Streptococcus pneumoniae NOT DETECTED NOT DETECTED   Streptococcus pyogenes NOT DETECTED NOT DETECTED   Acinetobacter baumannii NOT DETECTED NOT DETECTED   Enterobacteriaceae species DETECTED (A) NOT DETECTED   Enterobacter cloacae complex NOT DETECTED NOT DETECTED   Escherichia coli NOT DETECTED NOT DETECTED   Klebsiella oxytoca NOT DETECTED NOT DETECTED   Klebsiella pneumoniae NOT DETECTED NOT DETECTED   Proteus species NOT DETECTED NOT DETECTED   Serratia marcescens NOT DETECTED NOT DETECTED   Carbapenem resistance NOT DETECTED NOT DETECTED   Haemophilus influenzae NOT DETECTED NOT DETECTED   Neisseria meningitidis NOT DETECTED NOT DETECTED   Pseudomonas aeruginosa NOT DETECTED NOT DETECTED   Candida albicans NOT DETECTED NOT DETECTED   Candida glabrata NOT DETECTED NOT DETECTED   Candida krusei NOT DETECTED NOT DETECTED   Candida parapsilosis NOT DETECTED NOT DETECTED   Candida tropicalis NOT DETECTED NOT DETECTED    Name of physician (or Provider) Contacted: Tat  Changes to prescribed antibiotics required: none, MD already aware, changed to Meropenem. Pharmacy will follow for Cx results.  Dennies Coate A 11/02/2016  10:12 AM

## 2016-11-03 DIAGNOSIS — A4159 Other Gram-negative sepsis: Secondary | ICD-10-CM

## 2016-11-03 DIAGNOSIS — A414 Sepsis due to anaerobes: Secondary | ICD-10-CM

## 2016-11-03 DIAGNOSIS — N390 Urinary tract infection, site not specified: Secondary | ICD-10-CM

## 2016-11-03 LAB — URINE CULTURE: Culture: >=100000 — AB

## 2016-11-03 LAB — CBC
HCT: 40.3 % (ref 39.0–52.0)
HEMOGLOBIN: 13.6 g/dL (ref 13.0–17.0)
MCH: 32 pg (ref 26.0–34.0)
MCHC: 33.7 g/dL (ref 30.0–36.0)
MCV: 94.8 fL (ref 78.0–100.0)
Platelets: 147 10*3/uL — ABNORMAL LOW (ref 150–400)
RBC: 4.25 MIL/uL (ref 4.22–5.81)
RDW: 13.2 % (ref 11.5–15.5)
WBC: 16.7 10*3/uL — ABNORMAL HIGH (ref 4.0–10.5)

## 2016-11-03 LAB — BASIC METABOLIC PANEL
ANION GAP: 6 (ref 5–15)
BUN: 14 mg/dL (ref 6–20)
CO2: 24 mmol/L (ref 22–32)
Calcium: 8.6 mg/dL — ABNORMAL LOW (ref 8.9–10.3)
Chloride: 107 mmol/L (ref 101–111)
Creatinine, Ser: 0.77 mg/dL (ref 0.61–1.24)
GLUCOSE: 123 mg/dL — AB (ref 65–99)
POTASSIUM: 4.1 mmol/L (ref 3.5–5.1)
Sodium: 137 mmol/L (ref 135–145)

## 2016-11-03 LAB — MAGNESIUM: Magnesium: 1.8 mg/dL (ref 1.7–2.4)

## 2016-11-03 MED ORDER — PHENAZOPYRIDINE HCL 200 MG PO TABS
200.0000 mg | ORAL_TABLET | Freq: Three times a day (TID) | ORAL | Status: AC
  Administered 2016-11-03 – 2016-11-04 (×4): 200 mg via ORAL
  Filled 2016-11-03 (×4): qty 1

## 2016-11-03 NOTE — Progress Notes (Signed)
PROGRESS NOTE  Andrew Foster [id number] DOB: 03-May-1941 DOA: 11/01/2016 PCP: Hoyt Koch, MD  Brief History:  76 year old male with a history of BPH and diverticulosis presented with one-day history of dysuria and bilateral flank pain with increasing urinary frequency. The patient also endorsed fevers and chills and rigors with associated generalized weakness. He had a temperature 102.83F at home.  He denied any nausea, vomiting, diarrhea, chest pain or shortness breath, coughing, hemoptysis, headache, neck pain. Upon presentation, the patient was noted to have WBC 14.7 with lactic acid 2.64. Urinalysis showed TNTC WBC. The patient was started on intravenous fluids and ceftriaxone for sepsis secondary to urinary source.  Assessment/Plan: Sepsis -Secondary to bacteremia with urinary source -WBC decreasing -Continue Merrem -Discontinued ceftriaxone 1 g daily -Continue IV fluids with KCl -Lactic acid 2.64>>> 1.68 -Chest x-ray negative  Bacteremia--GNR -Continue Merrem pending culture data  Pyelonephritis--Klebsiella -Continue  Merrem  -11/01/2016 CT abdomen and pelvis--colonic diverticulosis, nonobstructive left renal pole calculus, L1-2 retrolisthesis -pyridium for dysuria  Chronic back pain -Continue Mobic and tramadol    Disposition Plan:   Home in 1-2 days  Family Communication:   No family at bedside  Consultants:  none  Code Status:  FULL  DVT Prophylaxis: Clifford Lovenox   Procedures: As Listed in Progress Note Above  Antibiotics: Ceftriaxone 11/01/16>>>3/4 Merrem 3/4>>>   Subjective: Patient continues to have dysuria and back pain although it is improving. He denies any fevers, chills, chest pain, shortness breath, nausea, vomiting, diarrhea. No abdominal pain or hematuria. Denies any headache or neck pain.  Objective: Vitals:   11/02/16 2102 11/02/16 2313 11/03/16 0550 11/03/16 1356  BP: (!) 129/58  120/75 123/67  Pulse: 80   82 76  Resp: 18  16 18   Temp: (!) 101 F (38.3 C) 99.6 F (37.6 C) 99.4 F (37.4 C) 99.2 F (37.3 C)  TempSrc: Oral Oral Oral Oral  SpO2: 96%  96% 97%  Weight:      Height:        Intake/Output Summary (Last 24 hours) at 11/03/16 1447 Last data filed at 11/03/16 1354  Gross per 24 hour  Intake          1276.25 ml  Output                0 ml  Net          1276.25 ml   Weight change:  Exam:   General:  Pt is alert, follows commands appropriately, not in acute distress  HEENT: No icterus, No thrush, No neck mass, [place]/AT  Cardiovascular: RRR, S1/S2, no rubs, no gallops  Respiratory: Diminished breath sounds at the bases bilaterally, no wheezing, no crackles, no rhonchi  Abdomen: Soft/+BS, non tender, non distended, no guarding  Extremities: No edema, No lymphangitis, No petechiae, No rashes, no synovitis   Data Reviewed: I have personally reviewed following labs and imaging studies Basic Metabolic Panel:  Recent Labs Lab 11/01/16 1452 11/02/16 0558 11/03/16 0600  NA 137 135 137  K 3.6 3.6 4.1  CL 103 105 107  CO2 23 24 24   GLUCOSE 103* 108* 123*  BUN 18 18 14   CREATININE 0.75 0.68 0.77  CALCIUM 9.2 8.0* 8.6*  MG  --   --  1.8   Liver Function Tests:  Recent Labs Lab 11/01/16 1452  AST 31  ALT 24  ALKPHOS 50  BILITOT 1.3*  PROT 6.8  ALBUMIN 4.2   No  results for input(s): LIPASE, AMYLASE in the last 168 hours. No results for input(s): AMMONIA in the last 168 hours. Coagulation Profile: No results for input(s): INR, PROTIME in the last 168 hours. CBC:  Recent Labs Lab 11/01/16 1452 11/02/16 0558 11/03/16 0600  WBC 14.7* 21.8* 16.7*  NEUTROABS 13.8*  --   --   HGB 14.8 13.5 13.6  HCT 43.0 39.6 40.3  MCV 93.3 94.5 94.8  PLT 191 158 147*   Cardiac Enzymes: No results for input(s): CKTOTAL, CKMB, CKMBINDEX, TROPONINI in the last 168 hours. BNP: Invalid input(s): POCBNP CBG:  Recent Labs Lab 11/01/16 2015  GLUCAP 102*   HbA1C: No  results for input(s): HGBA1C in the last 72 hours. Urine analysis:    Component Value Date/Time   COLORURINE YELLOW 11/01/2016 1453   APPEARANCEUR HAZY (A) 11/01/2016 1453   LABSPEC 1.015 11/01/2016 1453   PHURINE 7.0 11/01/2016 1453   GLUCOSEU NEGATIVE 11/01/2016 1453   GLUCOSEU NEGATIVE 10/18/2014 0902   HGBUR NEGATIVE 11/01/2016 1453   HGBUR negative 09/13/2008 1135   BILIRUBINUR NEGATIVE 11/01/2016 1453   KETONESUR NEGATIVE 11/01/2016 1453   PROTEINUR NEGATIVE 11/01/2016 1453   UROBILINOGEN 0.2 10/18/2014 0902   NITRITE NEGATIVE 11/01/2016 1453   LEUKOCYTESUR MODERATE (A) 11/01/2016 1453   Sepsis Labs: @LABRCNTIP (procalcitonin:4,lacticidven:4) ) Recent Results (from the past 240 hour(s))  Blood Culture (routine x 2)     Status: None (Preliminary result)   Collection Time: 11/01/16  1:25 PM  Result Value Ref Range Status   Specimen Description BLOOD RIGHT WRIST  Final   Special Requests BOTTLES DRAWN AEROBIC AND ANAEROBIC 5 CC  Final   Culture  Setup Time   Final    GRAM NEGATIVE RODS AEROBIC BOTTLE ONLY CRITICAL RESULT CALLED TO, READ BACK BY AND VERIFIED WITH: T GREEN,PHARMD AT 1436 11/02/16 BY L BENFIELD    Culture   Final    GRAM NEGATIVE RODS IDENTIFICATION TO FOLLOW Performed at [facility], [place] [address], [place], [place] [address]    Report Status PENDING  Incomplete  Blood Culture (routine x 2)     Status: None (Preliminary result)   Collection Time: 11/01/16  2:50 PM  Result Value Ref Range Status   Specimen Description BLOOD LEFT FOREARM  Final   Special Requests BOTTLES DRAWN AEROBIC AND ANAEROBIC 10 CC  Final   Culture  Setup Time   Final    GRAM NEGATIVE RODS AEROBIC BOTTLE ONLY CRITICAL RESULT CALLED TO, READ BACK BY AND VERIFIED WITH: T GREEN,PHARMD AT 0945 11/02/16 BY L BENFIELD    Culture   Final    GRAM NEGATIVE RODS IDENTIFICATION AND SUSCEPTIBILITIES TO FOLLOW Performed at [facility], [place] [address], [place], [place] [address]      Report Status PENDING  Incomplete  Blood Culture ID Panel (Reflexed)     Status: Abnormal   Collection Time: 11/01/16  2:50 PM  Result Value Ref Range Status   Enterococcus species NOT DETECTED NOT DETECTED Final   Listeria monocytogenes NOT DETECTED NOT DETECTED Final   Staphylococcus species NOT DETECTED NOT DETECTED Final   Staphylococcus aureus NOT DETECTED NOT DETECTED Final   Streptococcus species NOT DETECTED NOT DETECTED Final   Streptococcus agalactiae NOT DETECTED NOT DETECTED Final   Streptococcus pneumoniae NOT DETECTED NOT DETECTED Final   Streptococcus pyogenes NOT DETECTED NOT DETECTED Final   Acinetobacter baumannii NOT DETECTED NOT DETECTED Final   Enterobacteriaceae species DETECTED (A) NOT DETECTED Final    Comment:  Enterobacteriaceae represent a large family of gram negative bacteria, not a single organism. Refer to culture for further identification. CRITICAL RESULT CALLED TO, READ BACK BY AND VERIFIED WITH: T GREEN,PHARMD AT 0945 11/02/16 BY L BENFIELD    Enterobacter cloacae complex NOT DETECTED NOT DETECTED Final   Escherichia coli NOT DETECTED NOT DETECTED Final   Klebsiella oxytoca NOT DETECTED NOT DETECTED Final   Klebsiella pneumoniae NOT DETECTED NOT DETECTED Final   Proteus species NOT DETECTED NOT DETECTED Final   Serratia marcescens NOT DETECTED NOT DETECTED Final   Carbapenem resistance NOT DETECTED NOT DETECTED Final   Haemophilus influenzae NOT DETECTED NOT DETECTED Final   Neisseria meningitidis NOT DETECTED NOT DETECTED Final   Pseudomonas aeruginosa NOT DETECTED NOT DETECTED Final   Candida albicans NOT DETECTED NOT DETECTED Final   Candida glabrata NOT DETECTED NOT DETECTED Final   Candida krusei NOT DETECTED NOT DETECTED Final   Candida parapsilosis NOT DETECTED NOT DETECTED Final   Candida tropicalis NOT DETECTED NOT DETECTED Final    Comment: Performed at [facility], [place]. [address], [place], [place] [address]  Urine culture      Status: Abnormal   Collection Time: 11/01/16  2:53 PM  Result Value Ref Range Status   Specimen Description URINE, CLEAN CATCH  Final   Special Requests NONE  Final   Culture >=100,000 COLONIES/mL KLEBSIELLA SPECIES (A)  Final   Report Status 11/03/2016 FINAL  Final   Organism ID, Bacteria KLEBSIELLA SPECIES (A)  Final      Susceptibility   Klebsiella species - MIC*    AMPICILLIN RESISTANT Resistant     CEFAZOLIN <=4 SENSITIVE Sensitive     CEFTRIAXONE <=1 SENSITIVE Sensitive     CIPROFLOXACIN <=0.25 SENSITIVE Sensitive     GENTAMICIN <=1 SENSITIVE Sensitive     IMIPENEM <=0.25 SENSITIVE Sensitive     NITROFURANTOIN <=16 SENSITIVE Sensitive     TRIMETH/SULFA <=20 SENSITIVE Sensitive     AMPICILLIN/SULBACTAM <=2 SENSITIVE Sensitive     PIP/TAZO <=4 SENSITIVE Sensitive     * >=100,000 COLONIES/mL KLEBSIELLA SPECIES     Scheduled Meds: . aspirin EC  81 mg Oral Daily  . enoxaparin (LOVENOX) injection  40 mg Subcutaneous Q24H  . loratadine  10 mg Oral Once per day on Mon Wed Fri  . meloxicam  15 mg Oral Daily  . meropenem (MERREM) IV  1 g Intravenous Q8H  . multivitamin with minerals  1 tablet Oral Daily  . omega-3 acid ethyl esters  1 g Oral Daily  . pantoprazole  40 mg Oral Daily  . sodium chloride flush  3 mL Intravenous Q12H  . tamsulosin  0.4 mg Oral QHS   Continuous Infusions: . 0.9 % NaCl with KCl 20 mEq / L 75 mL/hr at 11/02/16 1007    Procedures/Studies: Dg Chest 2 View  Result Date: 11/01/2016 CLINICAL DATA:  Shortness of breath. EXAM: CHEST  2 VIEW COMPARISON:  Radiograph March 01, 2010. FINDINGS: The heart size and mediastinal contours are within normal limits. Both lungs are clear. No pneumothorax or pleural effusion is noted. Deformity of right humeral head is noted consistent with old fracture. IMPRESSION: No active cardiopulmonary disease. Electronically Signed   By: Marijo Conception, M.D.   On: 11/01/2016 15:59   Ct Renal Stone Study  Result Date:  11/01/2016 CLINICAL DATA:  constant bilateral flank pain with uncontrollable . He notes associated dysuria, decreased urinary output, and urinary frequency since yesterday. history of kidney stones Hernia  repair and hx diverticulitis EXAM: CT ABDOMEN AND PELVIS WITHOUT CONTRAST TECHNIQUE: Multidetector CT imaging of the abdomen and pelvis was performed following the standard protocol without IV contrast. COMPARISON:  05/21/2016 FINDINGS: Lower chest: Atherosclerosis of the aortic root. Heart size is normal. There is mild scarring at the lung bases. Hepatobiliary: No focal liver abnormality is seen. No gallstones, gallbladder wall thickening, or biliary dilatation. Pancreas: Unremarkable. No pancreatic ductal dilatation or surrounding inflammatory changes. Spleen: Normal in size without focal abnormality. Adrenals/Urinary Tract: Adrenal glands are unremarkable in appearance. Right renal cyst is 1.8 cm. There is a 2 mm calcification in the lower pole of the left kidney. No hydronephrosis. No ureteral stones. Stomach/Bowel: The stomach and small bowel loops are normal in appearance. The appendix is well seen and has a normal appearance. Significant colonic diverticulosis. No acute inflammatory changes related to the diverticulosis. Note is made of a left inguinal hernia containing mesenteric fat and a single sigmoid diverticulum. However there is no associated inflammation or perforation. No associated bowel obstruction. Vascular/Lymphatic: There is atherosclerotic calcification of the abdominal aorta and its branches. No aneurysm. No retroperitoneal or mesenteric adenopathy. Reproductive: Prostate is enlarged and partially calcified. Other: Small supraumbilical fat containing hernia. Left inguinal hernia, described above. Fat containing right inguinal hernia. Musculoskeletal: There are degenerative changes in the thoracolumbar spine. Degenerative retrolisthesis of 7 mm identified at L1-2. IMPRESSION: 1. Significant  colonic diverticulosis. A colonic diverticulum is located within the left inguinal hernia but not associated with acute inflammation or obstruction. 2. Small fat containing right inguinal hernia. 3. Small supraumbilical fat containing hernia. 4. Atherosclerosis of the aortic root. 5. Nonobstructing left lower pole renal calculus. No urinary tract obstruction. 6. Normal appendix. 7. Prostatic enlargement. 8. Degenerative changes in the spine. Grade 1 retrolisthesis of L1 on L2. Electronically Signed   By: Nolon Nations M.D.   On: 11/01/2016 15:44    Mads Borgmeyer, DO  [facility] Pager [phone]  If 7PM-7AM, please contact night-coverage [email or web address] Password [email or web address] 11/03/2016, 2:47 PM   LOS: 2 days

## 2016-11-04 LAB — CULTURE, BLOOD (ROUTINE X 2)

## 2016-11-04 LAB — BASIC METABOLIC PANEL
Anion gap: 5 (ref 5–15)
BUN: 9 mg/dL (ref 6–20)
CHLORIDE: 110 mmol/L (ref 101–111)
CO2: 24 mmol/L (ref 22–32)
CREATININE: 0.76 mg/dL (ref 0.61–1.24)
Calcium: 8.8 mg/dL — ABNORMAL LOW (ref 8.9–10.3)
GFR calc non Af Amer: >60 mL/min (ref >60)
Glucose, Bld: 118 mg/dL — ABNORMAL HIGH (ref 65–99)
Potassium: 3.7 mmol/L (ref 3.5–5.1)
Sodium: 139 mmol/L (ref 135–145)

## 2016-11-04 LAB — CBC
HEMATOCRIT: 38 % — AB (ref 39.0–52.0)
Hemoglobin: 12.9 g/dL — ABNORMAL LOW (ref 13.0–17.0)
MCH: 31.8 pg (ref 26.0–34.0)
MCHC: 33.9 g/dL (ref 30.0–36.0)
MCV: 93.6 fL (ref 78.0–100.0)
PLATELETS: 161 10*3/uL (ref 150–400)
RBC: 4.06 MIL/uL — AB (ref 4.22–5.81)
RDW: 13.2 % (ref 11.5–15.5)
WBC: 9.5 10*3/uL (ref 4.0–10.5)

## 2016-11-04 MED ORDER — PHENAZOPYRIDINE HCL 200 MG PO TABS: 200.0000 mg | ORAL_TABLET | Freq: Three times a day (TID) | ORAL | 0 refills | Status: DC

## 2016-11-04 MED ORDER — CIPROFLOXACIN HCL 500 MG PO TABS: 500.0000 mg | ORAL_TABLET | Freq: Two times a day (BID) | ORAL | 0 refills | Status: DC

## 2016-11-04 MED ORDER — CIPROFLOXACIN HCL 500 MG PO TABS
500.0000 mg | ORAL_TABLET | Freq: Two times a day (BID) | ORAL | Status: DC
  Administered 2016-11-04: 500 mg via ORAL
  Filled 2016-11-04: qty 1

## 2016-11-04 NOTE — Clinical Social Work Note (Addendum)
Clinical Social Work Assessment  Patient Details  Name: Andrew Foster MRN: [id number] Date of Birth: 1941/07/29  Date of referral:  11/04/16               Reason for consult:  Other (Comment Required) (Depression Screening )                Permission sought to share information with:    Permission granted to share information::     Name::        Agency::     Relationship::     Contact Information:     Housing/Transportation Living arrangements for the past 2 months:  Single Family Home Source of Information:  Patient Patient Interpreter Needed:  None Criminal Activity/Legal Involvement Pertinent to Current Situation/Hospitalization:  No - Comment as needed Significant Relationships:  Adult Children Lives with:  Self Do you feel safe going back to the place where you live?  Yes Need for family participation in patient care:  No (Coment)  Care giving concerns:  Depression screening.    Social Worker assessment / plan: LCSWA met with patient at bedside, explained role role and reason for depression screening PHQ-9 intervention. Patient agreeable to complete PHQ-9. Patient scored 0 on screening Patient stated, " I am not depressed and I feel I live a good life." Patient told stories about his children and grandchildren. He express having a great marriage with his wife for 2 years before she died three years ago. Patient reports no concerns at this time.  Plan: Depression screening complete-no follow up. CSW signing off at this time.   Employment status:    Insurance information:  Medicare PT Recommendations:    Information / Referral to community resources:     Patient/Family's Response to care:  Agreeable  Patient/Family's Understanding of and Emotional Response to Diagnosis, Current Treatment, and Prognosis:  No family at bedside .  Emotional Assessment Appearance:  Developmentally appropriate Attitude/Demeanor/Rapport:    Affect (typically observed):  Calm, Accepting,  Pleasant Orientation:  Oriented to Self, Oriented to Place, Oriented to  Time, Oriented to Situation Alcohol / Substance use:    Psych involvement (Current and /or in the community):  No (Comment)  Discharge Needs  Concerns to be addressed:  Other (Comment Required (Depression Screening) Readmission within the last 30 days:  No Current discharge risk:  None Barriers to Discharge:  Continued Medical Work up   [employer], LCSW 11/04/2016, 2:50 PM

## 2016-11-04 NOTE — Discharge Summary (Signed)
Physician Discharge Summary  Andrew Foster [id number] DOB: 07/01/1941 DOA: 11/01/2016  PCP: Hoyt Koch, MD  Admit date: 11/01/2016 Discharge date: 11/04/2016  Admitted From: Home Disposition:  Home   Recommendations for Outpatient Follow-up:  1. Follow up with PCP in 1-2 weeks 2. Please obtain BMP/CBC in one week 3. Follow up with urology, Dr. Manfred Arch on 11/11/16    Discharge Condition: Stable CODE STATUS:FULL Diet recommendation: Heart Healthy    Brief/Interim Summary: 76 year old male with a history of BPH and diverticulosis presented with one-day history of dysuria and bilateral flank pain with increasing urinary frequency. The patient also endorsed fevers and chills and rigors with associated generalized weakness. He had a temperature 102.58F at home. He denied any nausea, vomiting, diarrhea, chest pain or shortness breath, coughing, hemoptysis, headache, neck pain. Upon presentation, the patient was noted to have WBC 14.7 with lactic acid 2.64. Urinalysis showed TNTC WBC. The patient was started on intravenous fluids and ceftriaxone for sepsis secondary to urinary source.  Antibiotics were broadened to merrem empirically when preliminary BCID showed Klebsiella.  The patient improved clinically.  His abx were de-escalated to cipro.  He will go home with cipro x 11 more days to complete 14 days of therapy.  Discharge Diagnoses:  Sepsis -Secondary to bacteremia with urinary source -WBC decreasing -Continue Merrem-->home with cipro x 11 more days to complete 14 days of therapy -Discontinued ceftriaxone 1 g daily -Continue IV fluids with KCl -Lactic acid 2.64>>>1.68 -Chest x-ray negative  Bacteremia--Klebsiella -Continue Merrem pending culture data-->cipro once cultures returned  Pyelonephritis--Klebsiella -Continue  Merrem-->home with cipro  -11/01/2016 CT abdomen and pelvis--colonic diverticulosis, nonobstructive left renal pole calculus, L1-2  retrolisthesis -pyridium for dysuria  Chronic back pain -Continue Mobic and tramadol    Discharge Instructions  Discharge Instructions    Diet - low sodium heart healthy    Complete by:  As directed    Increase activity slowly    Complete by:  As directed      Allergies as of 11/04/2016   No Known Allergies     Medication List    STOP taking these medications   fluticasone 50 MCG/ACT nasal spray Commonly known as:  FLONASE   valACYclovir 1000 MG tablet Commonly known as:  VALTREX     TAKE these medications   aspirin 81 MG tablet Take 81 mg by mouth daily. What changed:  Another medication with the same name was removed. Continue taking this medication, and follow the directions you see here.   ciprofloxacin 500 MG tablet Commonly known as:  CIPRO Take 1 tablet (500 mg total) by mouth 2 (two) times daily. Start taking on:  11/05/2016   FISH OIL PO Take 1 capsule by mouth daily.   loratadine 10 MG tablet Commonly known as:  CLARITIN Take 1 tablet (10 mg total) by mouth daily. What changed:  additional instructions  Another medication with the same name was removed. Continue taking this medication, and follow the directions you see here.   meclizine 12.5 MG tablet Commonly known as:  ANTIVERT TAKE ONE TABLET BY MOUTH THREE TIMES DAILY AS NEEDED FOR DIZZINESS What changed:  See the new instructions.   meloxicam 15 MG tablet Commonly known as:  MOBIC TAKE ONE TABLET BY MOUTH ONCE DAILY   multivitamin tablet Take 1 tablet by mouth daily.   phenazopyridine 200 MG tablet Commonly known as:  PYRIDIUM Take 1 tablet (200 mg total) by mouth 3 (three) times daily with meals.   PRILOSEC OTC  20 MG tablet Generic drug:  omeprazole Take 20 mg by mouth daily.   tamsulosin 0.4 MG Caps capsule Commonly known as:  FLOMAX TAKE ONE CAPSULE BY MOUTH AT BEDTIME   traMADol 50 MG tablet Commonly known as:  ULTRAM Take 1 tablet (50 mg total) by mouth every 12 (twelve)  hours as needed. This is a 30 day supply, needs visit for refills. What changed:  when to take this  additional instructions       No Known Allergies  Consultations:  none   Procedures/Studies: Dg Chest 2 View  Result Date: 11/01/2016 CLINICAL DATA:  Shortness of breath. EXAM: CHEST  2 VIEW COMPARISON:  Radiograph March 01, 2010. FINDINGS: The heart size and mediastinal contours are within normal limits. Both lungs are clear. No pneumothorax or pleural effusion is noted. Deformity of right humeral head is noted consistent with old fracture. IMPRESSION: No active cardiopulmonary disease. Electronically Signed   By: Marijo Conception, M.D.   On: 11/01/2016 15:59   Ct Renal Stone Study  Result Date: 11/01/2016 CLINICAL DATA:  constant bilateral flank pain with uncontrollable . He notes associated dysuria, decreased urinary output, and urinary frequency since yesterday. history of kidney stones Hernia repair and hx diverticulitis EXAM: CT ABDOMEN AND PELVIS WITHOUT CONTRAST TECHNIQUE: Multidetector CT imaging of the abdomen and pelvis was performed following the standard protocol without IV contrast. COMPARISON:  05/21/2016 FINDINGS: Lower chest: Atherosclerosis of the aortic root. Heart size is normal. There is mild scarring at the lung bases. Hepatobiliary: No focal liver abnormality is seen. No gallstones, gallbladder wall thickening, or biliary dilatation. Pancreas: Unremarkable. No pancreatic ductal dilatation or surrounding inflammatory changes. Spleen: Normal in size without focal abnormality. Adrenals/Urinary Tract: Adrenal glands are unremarkable in appearance. Right renal cyst is 1.8 cm. There is a 2 mm calcification in the lower pole of the left kidney. No hydronephrosis. No ureteral stones. Stomach/Bowel: The stomach and small bowel loops are normal in appearance. The appendix is well seen and has a normal appearance. Significant colonic diverticulosis. No acute inflammatory changes related  to the diverticulosis. Note is made of a left inguinal hernia containing mesenteric fat and a single sigmoid diverticulum. However there is no associated inflammation or perforation. No associated bowel obstruction. Vascular/Lymphatic: There is atherosclerotic calcification of the abdominal aorta and its branches. No aneurysm. No retroperitoneal or mesenteric adenopathy. Reproductive: Prostate is enlarged and partially calcified. Other: Small supraumbilical fat containing hernia. Left inguinal hernia, described above. Fat containing right inguinal hernia. Musculoskeletal: There are degenerative changes in the thoracolumbar spine. Degenerative retrolisthesis of 7 mm identified at L1-2. IMPRESSION: 1. Significant colonic diverticulosis. A colonic diverticulum is located within the left inguinal hernia but not associated with acute inflammation or obstruction. 2. Small fat containing right inguinal hernia. 3. Small supraumbilical fat containing hernia. 4. Atherosclerosis of the aortic root. 5. Nonobstructing left lower pole renal calculus. No urinary tract obstruction. 6. Normal appendix. 7. Prostatic enlargement. 8. Degenerative changes in the spine. Grade 1 retrolisthesis of L1 on L2. Electronically Signed   By: Nolon Nations M.D.   On: 11/01/2016 15:44         Discharge Exam: Vitals:   11/04/16 0528 11/04/16 1416  BP: 130/70 137/78  Pulse: 63 66  Resp: 18 18  Temp: 98.3 F (36.8 C) 97.3 F (36.3 C)   Vitals:   11/03/16 1356 11/03/16 2056 11/04/16 0528 11/04/16 1416  BP: 123/67 106/74 130/70 137/78  Pulse: 76 70 63 66  Resp: 18 18  18 18  Temp: 99.2 F (37.3 C) 98.4 F (36.9 C) 98.3 F (36.8 C) 97.3 F (36.3 C)  TempSrc: Oral Oral Oral Oral  SpO2: 97% 94% 97% 100%  Weight:      Height:        General: Pt is alert, awake, not in acute distress Cardiovascular: RRR, S1/S2 +, no rubs, no gallops Respiratory: diminished BS but CTA bilaterally, no wheezing, no rhonchi Abdominal: Soft,  NT, ND, bowel sounds + Extremities: no edema, no cyanosis   The results of significant diagnostics from this hospitalization (including imaging, microbiology, ancillary and laboratory) are listed below for reference.    Significant Diagnostic Studies: Dg Chest 2 View  Result Date: 11/01/2016 CLINICAL DATA:  Shortness of breath. EXAM: CHEST  2 VIEW COMPARISON:  Radiograph March 01, 2010. FINDINGS: The heart size and mediastinal contours are within normal limits. Both lungs are clear. No pneumothorax or pleural effusion is noted. Deformity of right humeral head is noted consistent with old fracture. IMPRESSION: No active cardiopulmonary disease. Electronically Signed   By: Marijo Conception, M.D.   On: 11/01/2016 15:59   Ct Renal Stone Study  Result Date: 11/01/2016 CLINICAL DATA:  constant bilateral flank pain with uncontrollable . He notes associated dysuria, decreased urinary output, and urinary frequency since yesterday. history of kidney stones Hernia repair and hx diverticulitis EXAM: CT ABDOMEN AND PELVIS WITHOUT CONTRAST TECHNIQUE: Multidetector CT imaging of the abdomen and pelvis was performed following the standard protocol without IV contrast. COMPARISON:  05/21/2016 FINDINGS: Lower chest: Atherosclerosis of the aortic root. Heart size is normal. There is mild scarring at the lung bases. Hepatobiliary: No focal liver abnormality is seen. No gallstones, gallbladder wall thickening, or biliary dilatation. Pancreas: Unremarkable. No pancreatic ductal dilatation or surrounding inflammatory changes. Spleen: Normal in size without focal abnormality. Adrenals/Urinary Tract: Adrenal glands are unremarkable in appearance. Right renal cyst is 1.8 cm. There is a 2 mm calcification in the lower pole of the left kidney. No hydronephrosis. No ureteral stones. Stomach/Bowel: The stomach and small bowel loops are normal in appearance. The appendix is well seen and has a normal appearance. Significant colonic  diverticulosis. No acute inflammatory changes related to the diverticulosis. Note is made of a left inguinal hernia containing mesenteric fat and a single sigmoid diverticulum. However there is no associated inflammation or perforation. No associated bowel obstruction. Vascular/Lymphatic: There is atherosclerotic calcification of the abdominal aorta and its branches. No aneurysm. No retroperitoneal or mesenteric adenopathy. Reproductive: Prostate is enlarged and partially calcified. Other: Small supraumbilical fat containing hernia. Left inguinal hernia, described above. Fat containing right inguinal hernia. Musculoskeletal: There are degenerative changes in the thoracolumbar spine. Degenerative retrolisthesis of 7 mm identified at L1-2. IMPRESSION: 1. Significant colonic diverticulosis. A colonic diverticulum is located within the left inguinal hernia but not associated with acute inflammation or obstruction. 2. Small fat containing right inguinal hernia. 3. Small supraumbilical fat containing hernia. 4. Atherosclerosis of the aortic root. 5. Nonobstructing left lower pole renal calculus. No urinary tract obstruction. 6. Normal appendix. 7. Prostatic enlargement. 8. Degenerative changes in the spine. Grade 1 retrolisthesis of L1 on L2. Electronically Signed   By: Nolon Nations M.D.   On: 11/01/2016 15:44     Microbiology: Recent Results (from the past 240 hour(s))  Blood Culture (routine x 2)     Status: Abnormal   Collection Time: 11/01/16  1:25 PM  Result Value Ref Range Status   Specimen Description BLOOD RIGHT WRIST  Final  Special Requests BOTTLES DRAWN AEROBIC AND ANAEROBIC 5 CC  Final   Culture  Setup Time   Final    GRAM NEGATIVE RODS AEROBIC BOTTLE ONLY CRITICAL RESULT CALLED TO, READ BACK BY AND VERIFIED WITH: T GREEN,PHARMD AT 1436 11/02/16 BY L BENFIELD    Culture (A)  Final    KLEBSIELLA SPECIES SUSCEPTIBILITIES PERFORMED ON PREVIOUS CULTURE WITHIN THE LAST 5 DAYS. Performed at  [facility], [place] [address], [place], [place] [address]    Report Status 11/04/2016 FINAL  Final  Blood Culture (routine x 2)     Status: Abnormal   Collection Time: 11/01/16  2:50 PM  Result Value Ref Range Status   Specimen Description BLOOD LEFT FOREARM  Final   Special Requests BOTTLES DRAWN AEROBIC AND ANAEROBIC 10 CC  Final   Culture  Setup Time   Final    GRAM NEGATIVE RODS AEROBIC BOTTLE ONLY CRITICAL RESULT CALLED TO, READ BACK BY AND VERIFIED WITH: T GREEN,PHARMD AT 0945 11/02/16 BY L BENFIELD Performed at [facility], [place] [address], [place], [place] [address]    Culture KLEBSIELLA SPECIES (A)  Final   Report Status 11/04/2016 FINAL  Final   Organism ID, Bacteria KLEBSIELLA SPECIES  Final      Susceptibility   Klebsiella species - MIC*    AMPICILLIN RESISTANT Resistant     CEFAZOLIN <=4 SENSITIVE Sensitive     CEFEPIME <=1 SENSITIVE Sensitive     CEFTAZIDIME <=1 SENSITIVE Sensitive     CEFTRIAXONE <=1 SENSITIVE Sensitive     CIPROFLOXACIN <=0.25 SENSITIVE Sensitive     GENTAMICIN <=1 SENSITIVE Sensitive     IMIPENEM <=0.25 SENSITIVE Sensitive     TRIMETH/SULFA <=20 SENSITIVE Sensitive     AMPICILLIN/SULBACTAM <=2 SENSITIVE Sensitive     PIP/TAZO <=4 SENSITIVE Sensitive     * KLEBSIELLA SPECIES  Blood Culture ID Panel (Reflexed)     Status: Abnormal   Collection Time: 11/01/16  2:50 PM  Result Value Ref Range Status   Enterococcus species NOT DETECTED NOT DETECTED Final   Listeria monocytogenes NOT DETECTED NOT DETECTED Final   Staphylococcus species NOT DETECTED NOT DETECTED Final   Staphylococcus aureus NOT DETECTED NOT DETECTED Final   Streptococcus species NOT DETECTED NOT DETECTED Final   Streptococcus agalactiae NOT DETECTED NOT DETECTED Final   Streptococcus pneumoniae NOT DETECTED NOT DETECTED Final   Streptococcus pyogenes NOT DETECTED NOT DETECTED Final   Acinetobacter baumannii NOT DETECTED NOT DETECTED Final   Enterobacteriaceae species  DETECTED (A) NOT DETECTED Final    Comment: Enterobacteriaceae represent a large family of gram negative bacteria, not a single organism. Refer to culture for further identification. CRITICAL RESULT CALLED TO, READ BACK BY AND VERIFIED WITH: T GREEN,PHARMD AT 0945 11/02/16 BY L BENFIELD    Enterobacter cloacae complex NOT DETECTED NOT DETECTED Final   Escherichia coli NOT DETECTED NOT DETECTED Final   Klebsiella oxytoca NOT DETECTED NOT DETECTED Final   Klebsiella pneumoniae NOT DETECTED NOT DETECTED Final   Proteus species NOT DETECTED NOT DETECTED Final   Serratia marcescens NOT DETECTED NOT DETECTED Final   Carbapenem resistance NOT DETECTED NOT DETECTED Final   Haemophilus influenzae NOT DETECTED NOT DETECTED Final   Neisseria meningitidis NOT DETECTED NOT DETECTED Final   Pseudomonas aeruginosa NOT DETECTED NOT DETECTED Final   Candida albicans NOT DETECTED NOT DETECTED Final   Candida glabrata NOT DETECTED NOT DETECTED Final   Candida krusei NOT DETECTED NOT DETECTED Final   Candida  parapsilosis NOT DETECTED NOT DETECTED Final   Candida tropicalis NOT DETECTED NOT DETECTED Final    Comment: Performed at [facility], [place] [address], [place], [place] [address]  Urine culture     Status: Abnormal   Collection Time: 11/01/16  2:53 PM  Result Value Ref Range Status   Specimen Description URINE, CLEAN CATCH  Final   Special Requests NONE  Final   Culture >=100,000 COLONIES/mL KLEBSIELLA SPECIES (A)  Final   Report Status 11/03/2016 FINAL  Final   Organism ID, Bacteria KLEBSIELLA SPECIES (A)  Final      Susceptibility   Klebsiella species - MIC*    AMPICILLIN RESISTANT Resistant     CEFAZOLIN <=4 SENSITIVE Sensitive     CEFTRIAXONE <=1 SENSITIVE Sensitive     CIPROFLOXACIN <=0.25 SENSITIVE Sensitive     GENTAMICIN <=1 SENSITIVE Sensitive     IMIPENEM <=0.25 SENSITIVE Sensitive     NITROFURANTOIN <=16 SENSITIVE Sensitive     TRIMETH/SULFA <=20 SENSITIVE Sensitive      AMPICILLIN/SULBACTAM <=2 SENSITIVE Sensitive     PIP/TAZO <=4 SENSITIVE Sensitive     * >=100,000 COLONIES/mL KLEBSIELLA SPECIES     Labs: Basic Metabolic Panel:  Recent Labs Lab 11/01/16 1452 11/02/16 0558 11/03/16 0600 11/04/16 0654  NA 137 135 137 139  K 3.6 3.6 4.1 3.7  CL 103 105 107 110  CO2 23 24 24 24   GLUCOSE 103* 108* 123* 118*  BUN 18 18 14 9   CREATININE 0.75 0.68 0.77 0.76  CALCIUM 9.2 8.0* 8.6* 8.8*  MG  --   --  1.8  --    Liver Function Tests:  Recent Labs Lab 11/01/16 1452  AST 31  ALT 24  ALKPHOS 50  BILITOT 1.3*  PROT 6.8  ALBUMIN 4.2   No results for input(s): LIPASE, AMYLASE in the last 168 hours. No results for input(s): AMMONIA in the last 168 hours. CBC:  Recent Labs Lab 11/01/16 1452 11/02/16 0558 11/03/16 0600 11/04/16 0654  WBC 14.7* 21.8* 16.7* 9.5  NEUTROABS 13.8*  --   --   --   HGB 14.8 13.5 13.6 12.9*  HCT 43.0 39.6 40.3 38.0*  MCV 93.3 94.5 94.8 93.6  PLT 191 158 147* 161   Cardiac Enzymes: No results for input(s): CKTOTAL, CKMB, CKMBINDEX, TROPONINI in the last 168 hours. BNP: Invalid input(s): POCBNP CBG:  Recent Labs Lab 11/01/16 2015  GLUCAP 102*    Time coordinating discharge:  Greater than 30 minutes  Signed:  Graceanna Theissen, DO [facility] Pager: [phone] 11/04/2016, 5:20 PM

## 2016-11-05 ENCOUNTER — Telehealth: Payer: Self-pay | Admitting: *Deleted

## 2016-11-05 NOTE — Telephone Encounter (Signed)
Transition Care Management Follow-up Telephone Call   Date discharged? 11/04/16   How have you been since you were released from the hospital? Pt states he is doing alright   Do you understand why you were in the hospital? YES   Do you understand the discharge instructions? YES   Where were you discharged to? Home   Items Reviewed:  Medications reviewed: YES  Allergies reviewed: YES  Dietary changes reviewed: NO  Referrals reviewed: YES, urology appt 11/11/16 w/Dr. Manfred Arch   Functional Questionnaire:   Activities of Daily Living (ADLs):   He states he are independent in the following: ambulation, bathing and hygiene, feeding, continence, grooming, toileting and dressing States he doesn't require assistance    Any transportation issues/concerns?: NO   Any patient concerns? NO   Confirmed importance and date/time of follow-up visits scheduled YES, appt 11/14/16 Provider Appointment booked with Dr. Sharlet Salina   Confirmed with patient if condition begins to worsen call PCP or go to the ER.  Patient was given the office number and encouraged to call back with question or concerns.  : YES

## 2016-11-14 ENCOUNTER — Ambulatory Visit (INDEPENDENT_AMBULATORY_CARE_PROVIDER_SITE_OTHER): Payer: Medicare Other | Admitting: Internal Medicine

## 2016-11-14 ENCOUNTER — Other Ambulatory Visit: Payer: Medicare Other

## 2016-11-14 ENCOUNTER — Encounter: Payer: Self-pay | Admitting: Internal Medicine

## 2016-11-14 VITALS — BP 128/76 | HR 82 | Temp 98.5°F | Ht 68.0 in | Wt 177.0 lb

## 2016-11-14 DIAGNOSIS — A419 Sepsis, unspecified organism: Secondary | ICD-10-CM | POA: Diagnosis not present

## 2016-11-14 DIAGNOSIS — N10 Acute tubulo-interstitial nephritis: Secondary | ICD-10-CM | POA: Diagnosis not present

## 2016-11-14 DIAGNOSIS — R7881 Bacteremia: Secondary | ICD-10-CM | POA: Diagnosis not present

## 2016-11-14 MED ORDER — HYDROCORTISONE 2.5 % RE CREA: 1.0000 "application " | TOPICAL_CREAM | Freq: Two times a day (BID) | RECTAL | 0 refills | Status: DC

## 2016-11-14 NOTE — Assessment & Plan Note (Signed)
Due to UTI induced bacteremia. He is still taking cipro BID and will finish course. Symptoms of sepsis are resolved at this time.

## 2016-11-14 NOTE — Assessment & Plan Note (Signed)
From UTI, taking cipro. Will repeat culture today for clearance.

## 2016-11-14 NOTE — Patient Instructions (Signed)
We will check the blood today for the bacteria.   We have sent in the medicine for the backside which you can use twice a day for a week or so and see if it makes a difference.

## 2016-11-14 NOTE — Progress Notes (Signed)
Pre visit review using our clinic review tool, if applicable. No additional management support is needed unless otherwise documented below in the visit note. 

## 2016-11-14 NOTE — Progress Notes (Signed)
   Subjective:    Patient ID: Andrew Foster, male    DOB: 1941-06-27, 76 y.o.   MRN: [id number]  HPI The patient is a 76 YO man coming in for hospital follow up (in with sepsis from gram negative bacteremia from UTI, treated with IV antibiotics and sent home on cipro once cultures returned). He is doing well with cipro. No more fevers or chills. No side effects from cipro. Denies stomach pain or dysuria. Drinking plenty of fluids and eating well. Denies nausea, vomiting, diarrhea. Denies chest pains or SOB. Overall improving and still has a couple days of antibiotics left.   PMH, [facility], social history reviewed and updated.   Review of Systems  Constitutional: Negative.   HENT: Negative.   Eyes: Negative.   Respiratory: Negative for cough, chest tightness and shortness of breath.   Cardiovascular: Negative for chest pain, palpitations and leg swelling.  Gastrointestinal: Negative for abdominal distention, abdominal pain, constipation, diarrhea, nausea and vomiting.  Musculoskeletal: Negative.   Skin: Negative.   Neurological: Negative.   Psychiatric/Behavioral: Negative.       Objective:   Physical Exam  Constitutional: He is oriented to person, place, and time. He appears well-developed and well-nourished.  HENT:  Head: Normocephalic and atraumatic.  Eyes: EOM are normal.  Neck: Normal range of motion.  Cardiovascular: Normal rate and regular rhythm.   Pulmonary/Chest: Effort normal and breath sounds normal. No respiratory distress. He has no wheezes. He has no rales.  Abdominal: Soft. Bowel sounds are normal. He exhibits no distension. There is no tenderness. There is no rebound.  Musculoskeletal: He exhibits no edema.  Neurological: He is alert and oriented to person, place, and time. Coordination normal.  Skin: Skin is warm and dry.  Psychiatric: He has a normal mood and affect.   Vitals:   11/14/16 0948  BP: 128/76  Pulse: 82  Temp: 98.5 F (36.9 C)  TempSrc: Oral    SpO2: 96%  Weight: 177 lb (80.3 kg)  Height: 5\' 8"  (1.727 m)      Assessment & Plan:

## 2016-11-14 NOTE — Assessment & Plan Note (Signed)
Taking cipro for clearance and symptoms are gone at this time. Will finish course.

## 2016-11-21 LAB — CULTURE, BLOOD (SINGLE): Organism ID, Bacteria: NO GROWTH

## 2016-11-25 ENCOUNTER — Other Ambulatory Visit: Payer: Self-pay | Admitting: Internal Medicine

## 2016-12-01 DIAGNOSIS — N390 Urinary tract infection, site not specified: Secondary | ICD-10-CM | POA: Diagnosis not present

## 2016-12-01 DIAGNOSIS — N400 Enlarged prostate without lower urinary tract symptoms: Secondary | ICD-10-CM | POA: Diagnosis not present

## 2016-12-01 DIAGNOSIS — R972 Elevated prostate specific antigen [PSA]: Secondary | ICD-10-CM | POA: Diagnosis not present

## 2016-12-09 NOTE — Progress Notes (Signed)
Pre visit review using our clinic review tool, if applicable. No additional management support is needed unless otherwise documented below in the visit note. 

## 2016-12-09 NOTE — Progress Notes (Signed)
Subjective:   Andrew Foster is a 76 y.o. male who presents for Medicare Annual/Subsequent preventive examination.  Review of Systems:  No ROS.  Medicare Wellness Visit.    Sleep patterns: feels rested on waking, gets up 1-2 times nightly to void and sleeps 8 hours nightly.   Home Safety/Smoke Alarms: Feels safe in home. Smoke alarms in place.    Living environment; residence and Firearm Safety: 1-story house/ trailer, no firearms. Lives alone, family members live next door, no DME needs Seat Belt Safety/Bike Helmet: Wears seat belt.   Counseling:   Eye Exam- Last several years ago, educated that Medicare pays for yearly exams, patient states he will schedule an appointment Dental- implants and dentures, has ability to go to dentist  Male:   CCS- Last 04/04/03, diverticulosis, referral placed today     PSA-  Lab Results  Component Value Date   PSA 11.99 (H) 11/06/2008   PSA 14.09 (H) 10/12/2008   PSA 119.90 (H) 09/13/2008      Objective:    Vitals: BP 124/78   Pulse 98   Resp 20   Ht 5\' 6"  (1.676 m)   Wt 181 lb (82.1 kg)   SpO2 98%   BMI 29.21 kg/m   Body mass index is 29.21 kg/m.  Tobacco History  Smoking Status  . Former Smoker  . Quit date: 09/01/1974  Smokeless Tobacco  . Never Used    Comment: smoked 1956-1976, up to 3 ppd     Counseling given: Not Answered   Past Medical History:  Diagnosis Date  . BPH (benign prostatic hyperplasia)   . Diverticulosis   . Hypertension    Past Surgical History:  Procedure Laterality Date  . CARPAL TUNNEL RELEASE    . COLONOSCOPY     Tics; [facility]  . HERNIA REPAIR    . intra articular steroids     R shoulder  . PROSTATE BIOPSY     Dr Janice Norrie  . VASECTOMY     Family History  Problem Relation Age of Onset  . Heart attack Mother 24  . Diabetes Mother   . Diabetes Sister     TWO sisters  . Diabetes Brother     TWO brothers  . Diabetes type I Brother   . Stroke Neg Hx   . Cancer Neg Hx    History    Sexual Activity  . Sexual activity: Not Currently    Outpatient Encounter Prescriptions as of 12/10/2016  Medication Sig  . aspirin 81 MG tablet Take 81 mg by mouth daily.  . hydrocortisone (ANUSOL-HC) 2.5 % rectal cream Place 1 application rectally 2 (two) times daily.  Marland Kitchen loratadine (CLARITIN) 10 MG tablet Take 1 tablet (10 mg total) by mouth daily. (Patient taking differently: Take 10 mg by mouth daily. Takes 3 times weekly (MWF))  . meclizine (ANTIVERT) 12.5 MG tablet TAKE ONE TABLET BY MOUTH THREE TIMES DAILY AS NEEDED FOR DIZZINESS (Patient taking differently: Take one tablet 3 times weekly (T, TH, Sat))  . meloxicam (MOBIC) 15 MG tablet TAKE ONE TABLET BY MOUTH ONCE DAILY  . Multiple Vitamin (MULTIVITAMIN) tablet Take 1 tablet by mouth daily.   . Omega-3 Fatty Acids (FISH OIL PO) Take 1 capsule by mouth daily.  Marland Kitchen omeprazole (PRILOSEC OTC) 20 MG tablet Take 20 mg by mouth daily.   . tamsulosin (FLOMAX) 0.4 MG CAPS capsule TAKE ONE CAPSULE BY MOUTH AT BEDTIME  . traMADol (ULTRAM) 50 MG tablet Take 1 tablet (50  mg total) by mouth every 12 (twelve) hours as needed. This is a 30 day supply  . [DISCONTINUED] ciprofloxacin (CIPRO) 500 MG tablet Take 1 tablet (500 mg total) by mouth 2 (two) times daily. (Patient not taking: Reported on 12/10/2016)  . [DISCONTINUED] phenazopyridine (PYRIDIUM) 200 MG tablet Take 1 tablet (200 mg total) by mouth 3 (three) times daily with meals. (Patient not taking: Reported on 12/10/2016)   No facility-administered encounter medications on file as of 12/10/2016.     Activities of Daily Living In your present state of health, do you have any difficulty performing the following activities: 12/10/2016 11/02/2016  Hearing? N N  Vision? N N  Difficulty concentrating or making decisions? N N  Walking or climbing stairs? N N  Dressing or bathing? N N  Doing errands, shopping? N N  Preparing Food and eating ? N -  Using the Toilet? N -  In the past six months, have  you accidently leaked urine? N -  Do you have problems with loss of bowel control? N -  Managing your Medications? N -  Managing your Finances? N -  Housekeeping or managing your Housekeeping? N -  Some recent data might be hidden    Patient Care Team: Hoyt Koch, MD as PCP - General (Internal Medicine)   Assessment:    Physical assessment deferred to PCP.  Exercise Activities and Dietary recommendations Current Exercise Habits: Home exercise routine, Type of exercise: walking;stretching, Time (Minutes): 40, Frequency (Times/Week): 5, Weekly Exercise (Minutes/Week): 200, Intensity: Mild  Diet (meal preparation, eat out, water intake, caffeinated beverages, dairy products, fruits and vegetables): in general, a "healthy" diet  , well balanced, low fat/ cholesterol, low salt drinks 4-5 cups of coffee per day, drinks 1-2 glasses of water per day, reports eating a variety of fruits and vegetables.  Discussed decreasing coffee intake and increasing water intake. Reviewed low salt, low fat/ cholesterol diets.  Goals    . lose weight to feel healthy          Start to walk daily, eat healthy, and decrease portion size.       Fall Risk Fall Risk  12/10/2016 05/01/2016 04/30/2016 10/18/2014 02/28/2013  Falls in the past year? No Yes No No No  Number falls in past yr: - 1 - - -  Injury with Fall? - No - - -   Depression Screen PHQ 2/9 Scores 12/10/2016 05/01/2016 10/18/2014 02/28/2013  PHQ - 2 Score 0 0 0 0    Cognitive Function       Ad8 score reviewed for issues:  Issues making decisions: no  Less interest in hobbies / activities:no  Repeats questions, stories (family complaining):no  Trouble using ordinary gadgets (microwave, computer, phone): no  Forgets the month or year: no  Mismanaging finances: no  Remembering appts: no  Daily problems with thinking and/or memory: no Ad8 score is= 0  Immunization History  Administered Date(s) Administered  . Influenza  Whole 07/13/2007  . Influenza,inj,Quad PF,36+ Mos 05/01/2016  . Influenza-Unspecified 10/03/2015  . Pneumococcal Conjugate-13 12/10/2016  . Pneumococcal Polysaccharide-23 10/03/2015  . Td 04/22/2012   Screening Tests Health Maintenance  Topic Date Due  . INFLUENZA VACCINE  04/01/2017  . TETANUS/TDAP  04/22/2022  . PNA vac Low Risk Adult  Completed      Plan:     Continue to eat heart healthy diet (full of fruits, vegetables, whole grains, lean protein, water--limit salt, fat, and sugar intake) and increase physical activity as tolerated.  Continue doing brain stimulating activities (puzzles, reading, adult coloring books, staying active) to keep memory sharp.   Colonoscopy referral placed today, pneumonia vaccine sequence completed today During the course of the visit the patient was educated and counseled about the following appropriate screening and preventive services:   Vaccines to include Pneumoccal, Influenza, Hepatitis B, Td, Zostavax, HCV  Cardiovascular Disease  Colorectal cancer screening  Diabetes screening  Prostate Cancer Screening  Glaucoma screening  Nutrition counseling   Patient Instructions (the written plan) was given to the patient.    Michiel Cowboy, RN  12/10/2016

## 2016-12-10 ENCOUNTER — Ambulatory Visit (INDEPENDENT_AMBULATORY_CARE_PROVIDER_SITE_OTHER): Payer: Medicare Other | Admitting: *Deleted

## 2016-12-10 VITALS — BP 124/78 | HR 98 | Resp 20 | Ht 66.0 in | Wt 181.0 lb

## 2016-12-10 DIAGNOSIS — Z1211 Encounter for screening for malignant neoplasm of colon: Secondary | ICD-10-CM

## 2016-12-10 DIAGNOSIS — Z23 Encounter for immunization: Secondary | ICD-10-CM | POA: Diagnosis not present

## 2016-12-10 DIAGNOSIS — Z0000 Encounter for general adult medical examination without abnormal findings: Secondary | ICD-10-CM | POA: Diagnosis not present

## 2016-12-10 NOTE — Patient Instructions (Addendum)
Continue to eat heart healthy diet (full of fruits, vegetables, whole grains, lean protein, water--limit salt, fat, and sugar intake) and increase physical activity as tolerated.  Continue doing brain stimulating activities (puzzles, reading, adult coloring books, staying active) to keep memory sharp.   Andrew Foster , Thank you for taking time to come for your Medicare Wellness Visit. I appreciate your ongoing commitment to your health goals. Please review the following plan we discussed and let me know if I can assist you in the future.   These are the goals we discussed: Goals    . lose weight to feel healthy          Start to walk daily and eat healthy, decrease portion size.        This is a list of the screening recommended for you and due dates:  Health Maintenance  Topic Date Due  . Pneumonia vaccines (2 of 2 - PCV13) 10/02/2016  . Flu Shot  04/01/2017  . Tetanus Vaccine  04/22/2022

## 2016-12-11 NOTE — Progress Notes (Signed)
Medical screening examination/treatment/procedure(s) were performed by non-physician practitioner and as supervising physician I was immediately available for consultation/collaboration. I agree with above. Bev Drennen A Obdulio Mash, MD 

## 2016-12-19 ENCOUNTER — Encounter: Payer: Self-pay | Admitting: Internal Medicine

## 2016-12-22 ENCOUNTER — Other Ambulatory Visit: Payer: Self-pay | Admitting: Internal Medicine

## 2017-01-30 DIAGNOSIS — D126 Benign neoplasm of colon, unspecified: Secondary | ICD-10-CM

## 2017-01-30 HISTORY — DX: Benign neoplasm of colon, unspecified: D12.6

## 2017-02-04 ENCOUNTER — Ambulatory Visit (AMBULATORY_SURGERY_CENTER): Payer: Self-pay

## 2017-02-04 VITALS — Ht 66.0 in | Wt 176.0 lb

## 2017-02-04 DIAGNOSIS — Z1211 Encounter for screening for malignant neoplasm of colon: Secondary | ICD-10-CM

## 2017-02-04 NOTE — Progress Notes (Signed)
No allergies to eggs or soy No diet meds No home oxygen nopast problems with anesthesia EXCEPT HAVE A PROBLEM WAKING ME UP'; I WANT TO SLEEP  Declined emmi

## 2017-02-18 ENCOUNTER — Ambulatory Visit (AMBULATORY_SURGERY_CENTER): Payer: Medicare Other | Admitting: Internal Medicine

## 2017-02-18 ENCOUNTER — Encounter: Payer: Self-pay | Admitting: Internal Medicine

## 2017-02-18 VITALS — BP 130/68 | HR 55 | Temp 98.4°F | Resp 33 | Ht 66.0 in | Wt 176.0 lb

## 2017-02-18 DIAGNOSIS — D122 Benign neoplasm of ascending colon: Secondary | ICD-10-CM | POA: Diagnosis not present

## 2017-02-18 DIAGNOSIS — Z1211 Encounter for screening for malignant neoplasm of colon: Secondary | ICD-10-CM

## 2017-02-18 DIAGNOSIS — Z1212 Encounter for screening for malignant neoplasm of rectum: Secondary | ICD-10-CM | POA: Diagnosis not present

## 2017-02-18 DIAGNOSIS — D123 Benign neoplasm of transverse colon: Secondary | ICD-10-CM | POA: Diagnosis not present

## 2017-02-18 MED ORDER — SODIUM CHLORIDE 0.9 % IV SOLN: 500.0000 mL | INTRAVENOUS | Status: DC

## 2017-02-18 NOTE — Progress Notes (Signed)
Alert and oriented x3, pleased with MAC, report to RN  

## 2017-02-18 NOTE — Op Note (Signed)
[facility] Patient Name: Andrew Foster Procedure Date: 02/18/2017 7:49 AM MRN: [id number] Endoscopist: Gatha Mayer , MD Age: 76 Referring MD:  Date of Birth: 16-Oct-1940 Gender: Male Account #: [id number] Procedure:                Colonoscopy Indications:              Screening for colorectal malignant neoplasm Medicines:                Monitored Anesthesia Care Procedure:                Pre-Anesthesia Assessment:                           - Prior to the procedure, a History and Physical                            was performed, and patient medications and                            allergies were reviewed. The patient's tolerance of                            previous anesthesia was also reviewed. The risks                            and benefits of the procedure and the sedation                            options and risks were discussed with the patient.                            All questions were answered, and informed consent                            was obtained. Prior Anticoagulants: The patient                            last took aspirin 1 day prior to the procedure. ASA                            Grade Assessment: II - A patient with mild systemic                            disease. After reviewing the risks and benefits,                            the patient was deemed in satisfactory condition to                            undergo the procedure.                           After obtaining informed consent, the colonoscope  was passed under direct vision. Throughout the                            procedure, the patient's blood pressure, pulse, and                            oxygen saturations were monitored continuously. The                            Colonoscope was introduced through the anus and                            advanced to the the cecum, identified by                            appendiceal orifice and ileocecal valve.  The                            colonoscopy was performed without difficulty. The                            patient tolerated the procedure well. The quality                            of the bowel preparation was good. The bowel                            preparation used was Miralax. The ileocecal valve,                            appendiceal orifice, and rectum were photographed. Scope In: 7:56:30 AM Scope Out: 8:14:26 AM Scope Withdrawal Time: 0 hours 14 minutes 57 seconds  Total Procedure Duration: 0 hours 17 minutes 56 seconds  Findings:                 The perianal and digital rectal examinations were                            normal. Pertinent negatives include normal prostate                            (size, shape, and consistency).                           Two sessile polyps were found in the transverse                            colon. The polyps were diminutive in size. These                            polyps were removed with a cold snare. Resection                            and retrieval were complete. Verification of  patient identification for the specimen was done.                            Estimated blood loss was minimal.                           A 2 mm polyp was found in the ascending colon. The                            polyp was sessile. The polyp was removed with a                            cold biopsy forceps. Resection and retrieval were                            complete. Verification of patient identification                            for the specimen was done. Estimated blood loss was                            minimal.                           Many small and large-mouthed diverticula were found                            in the sigmoid colon.                           Internal hemorrhoids were found during retroflexion.                           The exam was otherwise without abnormality on                             direct and retroflexion views. Complications:            No immediate complications. Estimated Blood Loss:     Estimated blood loss was minimal. Impression:               - Two diminutive polyps in the transverse colon,                            removed with a cold snare. Resected and retrieved.                           - One 2 mm polyp in the ascending colon, removed                            with a cold biopsy forceps. Resected and retrieved.                           - Diverticulosis in the sigmoid colon.                           -  Internal hemorrhoids.                           - The examination was otherwise normal on direct                            and retroflexion views. Recommendation:           - Patient has a contact number available for                            emergencies. The signs and symptoms of potential                            delayed complications were discussed with the                            patient. Return to normal activities tomorrow.                            Written discharge instructions were provided to the                            patient.                           - Resume previous diet.                           - Continue present medications.                           - Repeat colonoscopy is recommended possibly as he                            is 75. The colonoscopy date will be determined                            after pathology results from today's exam become                            available for review. Gatha Mayer, MD 02/18/2017 8:25:28 AM This report has been signed electronically.

## 2017-02-18 NOTE — Progress Notes (Signed)
Pt's states no medical or surgical changes since previsit or office visit. 

## 2017-02-18 NOTE — Patient Instructions (Addendum)
I found and removed 3 small polyps that look benign. I will let you know pathology results and if/when to have another routine colonoscopy by mail and/or My Chart.  You also have diverticulosis - thickened muscle rings and pouches in the colon wall. Please read the handout about this condition.  Also saw internal hemorrhoids. If you have hemorrhoid problems (swelling, itching, bleeding) I am able to treat those with an in-office procedure. If you like, please call my office at [phone] to schedule an appointment and I can evaluate you further.  I appreciate the opportunity to care for you. Gatha Mayer, MD, FACG   YOU HAD AN ENDOSCOPIC PROCEDURE TODAY AT [facility]:   Refer to the procedure report that was given to you for any specific questions about what was found during the examination.  If the procedure report does not answer your questions, please call your gastroenterologist to clarify.  If you requested that your care partner not be given the details of your procedure findings, then the procedure report has been included in a sealed envelope for you to review at your convenience later.  YOU SHOULD EXPECT: Some feelings of bloating in the abdomen. Passage of more gas than usual.  Walking can help get rid of the air that was put into your GI tract during the procedure and reduce the bloating. If you had a lower endoscopy (such as a colonoscopy or flexible sigmoidoscopy) you may notice spotting of blood in your stool or on the toilet paper. If you underwent a bowel prep for your procedure, you may not have a normal bowel movement for a few days.  Please Note:  You might notice some irritation and congestion in your nose or some drainage.  This is from the oxygen used during your procedure.  There is no need for concern and it should clear up in a day or so.  SYMPTOMS TO REPORT IMMEDIATELY:   Following lower endoscopy (colonoscopy or flexible  sigmoidoscopy):  Excessive amounts of blood in the stool  Significant tenderness or worsening of abdominal pains  Swelling of the abdomen that is new, acute  Fever of 100F or higher  For urgent or emergent issues, a gastroenterologist can be reached at any hour by calling [phone].   DIET:  We do recommend a small meal at first, but then you may proceed to your regular diet.  Drink plenty of fluids but you should avoid alcoholic beverages for 24 hours.  MEDICATIONS:  Continue present medications.  Please see handouts given to you by your recovery nurse.  ACTIVITY:  You should plan to take it easy for the rest of today and you should NOT DRIVE or use heavy machinery until tomorrow (because of the sedation medicines used during the test).    FOLLOW UP: Our staff will call the number listed on your records the next business day following your procedure to check on you and address any questions or concerns that you may have regarding the information given to you following your procedure. If we do not reach you, we will leave a message.  However, if you are feeling well and you are not experiencing any problems, there is no need to return our call.  We will assume that you have returned to your regular daily activities without incident.  If any biopsies were taken you will be contacted by phone or by letter within the next 1-3 weeks.  Please call us at [phone] if  you have not heard about the biopsies in 3 weeks.  Thank you for allowing [place] to provide for your healthcare needs today.    SIGNATURES/CONFIDENTIALITY: You and/or your care partner have signed paperwork which will be entered into your electronic medical record.  These signatures attest to the fact that that the information above on your After Visit Summary has been reviewed and is understood.  Full responsibility of the confidentiality of this discharge information lies with you and/or your care-partner.

## 2017-02-18 NOTE — Progress Notes (Signed)
Called to room to assist during endoscopic procedure.  Patient ID and intended procedure confirmed with present staff. Received instructions for my participation in the procedure from the performing physician.  

## 2017-02-19 ENCOUNTER — Telehealth: Payer: Self-pay | Admitting: *Deleted

## 2017-02-19 NOTE — Telephone Encounter (Signed)
  Follow up Call-  Call back number 02/18/2017  Post procedure Call Back phone  # [phone]  Permission to leave phone message Yes  Some recent data might be hidden     Patient questions:  Do you have a fever, pain , or abdominal swelling? No. Pain Score  0 *  Have you tolerated food without any problems? Yes.    Have you been able to return to your normal activities? Yes.    Do you have any questions about your discharge instructions: Diet   No. Medications  No. Follow up visit  No.  Do you have questions or concerns about your Care? No.  Actions: * If pain score is 4 or above: No action needed, pain <4.

## 2017-02-26 ENCOUNTER — Encounter: Payer: Self-pay | Admitting: Internal Medicine

## 2017-02-26 NOTE — Progress Notes (Signed)
3 diminutive adenomas age 76 no recall

## 2017-03-28 ENCOUNTER — Other Ambulatory Visit: Payer: Self-pay | Admitting: Internal Medicine

## 2017-03-30 NOTE — Telephone Encounter (Signed)
Please advise, tramadol last dispensed 02/28/2017

## 2017-04-30 ENCOUNTER — Other Ambulatory Visit: Payer: Self-pay | Admitting: Internal Medicine

## 2017-05-25 DIAGNOSIS — R972 Elevated prostate specific antigen [PSA]: Secondary | ICD-10-CM | POA: Diagnosis not present

## 2017-05-29 ENCOUNTER — Other Ambulatory Visit: Payer: Self-pay | Admitting: Internal Medicine

## 2017-06-01 DIAGNOSIS — R972 Elevated prostate specific antigen [PSA]: Secondary | ICD-10-CM | POA: Diagnosis not present

## 2017-06-01 DIAGNOSIS — N400 Enlarged prostate without lower urinary tract symptoms: Secondary | ICD-10-CM | POA: Diagnosis not present

## 2017-06-03 ENCOUNTER — Other Ambulatory Visit: Payer: Self-pay | Admitting: Internal Medicine

## 2017-06-05 ENCOUNTER — Other Ambulatory Visit: Payer: Self-pay | Admitting: Internal Medicine

## 2017-06-08 ENCOUNTER — Other Ambulatory Visit: Payer: Self-pay | Admitting: Internal Medicine

## 2017-06-09 ENCOUNTER — Encounter: Payer: Self-pay | Admitting: Internal Medicine

## 2017-06-09 ENCOUNTER — Ambulatory Visit (INDEPENDENT_AMBULATORY_CARE_PROVIDER_SITE_OTHER): Payer: Medicare Other | Admitting: Internal Medicine

## 2017-06-09 VITALS — BP 120/80 | HR 59 | Temp 98.7°F | Ht 66.0 in | Wt 175.0 lb

## 2017-06-09 DIAGNOSIS — Z23 Encounter for immunization: Secondary | ICD-10-CM

## 2017-06-09 DIAGNOSIS — K439 Ventral hernia without obstruction or gangrene: Secondary | ICD-10-CM

## 2017-06-09 MED ORDER — MELOXICAM 15 MG PO TABS: 15.0000 mg | ORAL_TABLET | Freq: Every day | ORAL | 3 refills | Status: DC

## 2017-06-09 NOTE — Patient Instructions (Signed)
We have sent in the refills of the medications.

## 2017-06-10 DIAGNOSIS — K439 Ventral hernia without obstruction or gangrene: Secondary | ICD-10-CM | POA: Insufficient documentation

## 2017-06-10 NOTE — Progress Notes (Signed)
   Subjective:    Patient ID: Andrew Foster, male    DOB: 1941-01-07, 76 y.o.   MRN: [id number]  HPI The patient is a 76 YO man coming in for hernia on his stomach. Has been present for some time. Now coming out more often. He does have some discomfort but it only lasts for a short time. Denies constipation or diarrhea. Denies blood in stool. He is not sure if he wants surgery for it. This happens 1-2 times per week.   Review of Systems  Constitutional: Negative.   Respiratory: Negative for cough, chest tightness and shortness of breath.   Cardiovascular: Negative for chest pain, palpitations and leg swelling.  Gastrointestinal: Positive for abdominal distention. Negative for abdominal pain, constipation, diarrhea, nausea and vomiting.  Musculoskeletal: Negative.   Skin: Negative.   Neurological: Negative.       Objective:   Physical Exam  Constitutional: He is oriented to person, place, and time. He appears well-developed and well-nourished.  HENT:  Head: Normocephalic and atraumatic.  Eyes: EOM are normal.  Neck: Normal range of motion.  Cardiovascular: Normal rate and regular rhythm.   Pulmonary/Chest: Effort normal and breath sounds normal. No respiratory distress. He has no wheezes. He has no rales.  Abdominal: Soft. Bowel sounds are normal. He exhibits no distension. There is no tenderness. There is no rebound.  Ventral hernia about 2 cm easily reducible.   Musculoskeletal: He exhibits no edema.  Neurological: He is alert and oriented to person, place, and time. Coordination normal.  Skin: Skin is warm and dry.   Vitals:   06/09/17 1524  BP: 120/80  Pulse: (!) 59  Temp: 98.7 F (37.1 C)  TempSrc: Oral  SpO2: 98%  Weight: 175 lb (79.4 kg)  Height: 5\' 6"  (1.676 m)      Assessment & Plan:  Flu shot given at visit.

## 2017-06-10 NOTE — Assessment & Plan Note (Signed)
No obstruction, mild pain with protrusion. Easily reducible on exam. Recommend to observe at this time. Counseled to reduce intra-abdominal pressure by avoiding constipation, heavy lifting.

## 2017-06-19 DIAGNOSIS — H26491 Other secondary cataract, right eye: Secondary | ICD-10-CM | POA: Diagnosis not present

## 2017-06-19 DIAGNOSIS — H2512 Age-related nuclear cataract, left eye: Secondary | ICD-10-CM | POA: Diagnosis not present

## 2017-07-02 DIAGNOSIS — H25812 Combined forms of age-related cataract, left eye: Secondary | ICD-10-CM | POA: Diagnosis not present

## 2017-07-02 DIAGNOSIS — H2512 Age-related nuclear cataract, left eye: Secondary | ICD-10-CM | POA: Diagnosis not present

## 2017-08-02 ENCOUNTER — Other Ambulatory Visit: Payer: Self-pay | Admitting: Family

## 2017-08-05 ENCOUNTER — Other Ambulatory Visit: Payer: Self-pay | Admitting: Internal Medicine

## 2017-08-06 MED ORDER — TRAMADOL HCL 50 MG PO TABS: 50.0000 mg | ORAL_TABLET | Freq: Two times a day (BID) | ORAL | 3 refills | Status: DC | PRN

## 2017-12-03 DIAGNOSIS — R972 Elevated prostate specific antigen [PSA]: Secondary | ICD-10-CM | POA: Diagnosis not present

## 2017-12-14 DIAGNOSIS — N400 Enlarged prostate without lower urinary tract symptoms: Secondary | ICD-10-CM | POA: Diagnosis not present

## 2017-12-14 DIAGNOSIS — R972 Elevated prostate specific antigen [PSA]: Secondary | ICD-10-CM | POA: Diagnosis not present

## 2017-12-14 NOTE — Progress Notes (Signed)
Subjective:   Andrew Foster is a 77 y.o. male who presents for Medicare Annual/Subsequent preventive examination.  Review of Systems:  No ROS.  Medicare Wellness Visit. Additional risk factors are reflected in the social history.  Cardiac Risk Factors include: advanced age (>2men, >14 women) Sleep patterns: gets up 2-3 times nightly to void and sleeps 7 hours nightly.    Home Safety/Smoke Alarms: Feels safe in home. Smoke alarms in place.  Living environment; residence and Firearm Safety: 1-story house/ trailer, no firearms. Seat Belt Safety/Bike Helmet: Wears seat belt.     Objective:    Vitals: BP 116/64   Pulse 70   Resp 18   Ht 5\' 6"  (1.676 m)   Wt 177 lb (80.3 kg)   SpO2 95%   BMI 28.57 kg/m   Body mass index is 28.57 kg/m.  Advanced Directives 12/15/2017 02/04/2017 12/10/2016 11/02/2016 11/01/2016  Does Patient Have a Medical Advance Directive? Yes No No No No  Type of Paramedic of [place];Living will - - - -  Does patient want to make changes to medical advance directive? - - Yes (ED - Information included in AVS) - -  Copy of [place] in Chart? No - copy requested - - - -  Would patient like information on creating a medical advance directive? - - - No - Patient declined -    Tobacco Social History   Tobacco Use  Smoking Status Former Smoker  . Last attempt to quit: 09/01/1974  . Years since quitting: 43.3  Smokeless Tobacco Never Used  Tobacco Comment   smoked 1956-1976, up to 3 ppd     Counseling given: Not Answered Comment: smoked 1956-1976, up to 3 ppd  Past Medical History:  Diagnosis Date  . Acute pyelonephritis 11/01/2016  . Adenomatous polyps of colon 01/2017   no f/u - age  . BPH (benign prostatic hyperplasia)   . Diverticulosis   . Hypertension    Past Surgical History:  Procedure Laterality Date  . CARPAL TUNNEL RELEASE    . COLONOSCOPY     Tics; [facility]  . HERNIA REPAIR    . intra articular  steroids     R shoulder  . PROSTATE BIOPSY     Dr Janice Norrie  . VASECTOMY     Family History  Problem Relation Age of Onset  . Heart attack Mother 63  . Diabetes Mother   . Diabetes Sister        TWO sisters  . Diabetes Brother        TWO brothers  . Diabetes type I Brother   . Stroke Neg Hx   . Cancer Neg Hx   . Colon cancer Neg Hx    Social History   Socioeconomic History  . Marital status: Widowed    Spouse name: Not on file  . Number of children: 4  . Years of education: Not on file  . Highest education level: Not on file  Occupational History  . Not on file  Social Needs  . Financial resource strain: Not hard at all  . Food insecurity:    Worry: Never true    Inability: Never true  . Transportation needs:    Medical: No    Non-medical: No  Tobacco Use  . Smoking status: Former Smoker    Last attempt to quit: 09/01/1974    Years since quitting: 43.3  . Smokeless tobacco: Never Used  . Tobacco comment: smoked 1956-1976, up  to 3 ppd  Substance and Sexual Activity  . Alcohol use: No  . Drug use: No  . Sexual activity: Not Currently  Lifestyle  . Physical activity:    Days per week: 3 days    Minutes per session: 40 min  . Stress: Not at all  Relationships  . Social connections:    Talks on phone: More than three times a week    Gets together: More than three times a week    Attends religious service: More than 4 times per year    Active member of club or organization: Yes    Attends meetings of clubs or organizations: More than 4 times per year    Relationship status: Widowed  Other Topics Concern  . Not on file  Social History Narrative  . Not on file    Outpatient Encounter Medications as of 12/15/2017  Medication Sig  . aspirin 81 MG tablet Take 81 mg by mouth daily.  . meclizine (ANTIVERT) 12.5 MG tablet TAKE 1 TABLET BY MOUTH THREE TIMES DAILY AS NEEDED FOR  DIZZINESS  . meloxicam (MOBIC) 15 MG tablet Take 1 tablet (15 mg total) by mouth daily.  .  Multiple Vitamin (MULTIVITAMIN) tablet Take 1 tablet by mouth daily.   . Omega-3 Fatty Acids (FISH OIL PO) Take 1 capsule by mouth daily.  Marland Kitchen omeprazole (PRILOSEC OTC) 20 MG tablet Take 20 mg by mouth daily.   . tamsulosin (FLOMAX) 0.4 MG CAPS capsule TAKE ONE CAPSULE BY MOUTH AT BEDTIME  . traMADol (ULTRAM) 50 MG tablet Take 1 tablet (50 mg total) by mouth every 12 (twelve) hours as needed.  . Vitamin D, Cholecalciferol, 1000 units CAPS Take by mouth.  . [DISCONTINUED] hydrocortisone (ANUSOL-HC) 2.5 % rectal cream Place 1 application rectally 2 (two) times daily. (Patient not taking: Reported on 02/18/2017)   No facility-administered encounter medications on file as of 12/15/2017.     Activities of Daily Living In your present state of health, do you have any difficulty performing the following activities: 12/15/2017  Hearing? N  Vision? N  Difficulty concentrating or making decisions? N  Walking or climbing stairs? N  Dressing or bathing? N  Doing errands, shopping? N  Preparing Food and eating ? N  Using the Toilet? N  In the past six months, have you accidently leaked urine? N  Do you have problems with loss of bowel control? N  Managing your Medications? N  Managing your Finances? N  Housekeeping or managing your Housekeeping? N  Some recent data might be hidden    Patient Care Team: Hoyt Koch, MD as PCP - General (Internal Medicine)   Assessment:   This is a routine wellness examination for [place]. Physical assessment deferred to PCP.   Exercise Activities and Dietary recommendations Current Exercise Habits: Home exercise routine, Type of exercise: walking, Time (Minutes): 40, Frequency (Times/Week): 3, Weekly Exercise (Minutes/Week): 120, Intensity: Mild, Exercise limited by: None identified  Diet (meal preparation, eat out, water intake, caffeinated beverages, dairy products, fruits and vegetables): in general, a "healthy" diet  , well balanced   Reviewed  heart healthy diet, encouraged patient to increase daily water intake.    Goals    . Patient Stated     Stay as healthy and as independent as possible and keep riding my [name].       Fall Risk Fall Risk  12/15/2017 12/10/2016 05/01/2016 04/30/2016 10/18/2014  Falls in the past year? No No Yes No No  Comment - - - Emmi Telephone Survey: data to providers prior to load -  Number falls in past yr: - - 1 - -  Injury with Fall? - - No - -    Depression Screen PHQ 2/9 Scores 12/15/2017 12/10/2016 05/01/2016 10/18/2014  PHQ - 2 Score 1 0 0 0  PHQ- 9 Score 2 - - -    Cognitive Function MMSE - Mini Mental State Exam 12/15/2017  Orientation to time 5  Orientation to Place 5  Registration 3  Attention/ Calculation 5  Recall 1  Language- name 2 objects 2  Language- repeat 1  Language- follow 3 step command 3  Language- read & follow direction 1  Write a sentence 1  Copy design 1  Total score 28        Immunization History  Administered Date(s) Administered  . Influenza Whole 07/13/2007  . Influenza, High Dose Seasonal PF 06/09/2017  . Influenza,inj,Quad PF,6+ Mos 05/01/2016  . Influenza-Unspecified 10/03/2015  . Pneumococcal Conjugate-13 12/10/2016  . Pneumococcal Polysaccharide-23 10/03/2015  . Td 04/22/2012   Screening Tests Health Maintenance  Topic Date Due  . INFLUENZA VACCINE  04/01/2018  . TETANUS/TDAP  04/22/2022  . PNA vac Low Risk Adult  Completed        Plan:  Continue doing brain stimulating activities (puzzles, reading, adult coloring books, staying active) to keep memory sharp.   Continue to eat heart healthy diet (full of fruits, vegetables, whole grains, lean protein, water--limit salt, fat, and sugar intake) and increase physical activity as tolerated.   I have personally reviewed and noted the following in the patient's chart:   . Medical and social history . Use of alcohol, tobacco or illicit drugs  . Current medications and  supplements . Functional ability and status . Nutritional status . Physical activity . Advanced directives . List of other physicians . Vitals . Screenings to include cognitive, depression, and falls . Referrals and appointments  In addition, I have reviewed and discussed with patient certain preventive protocols, quality metrics, and best practice recommendations. A written personalized care plan for preventive services as well as general preventive health recommendations were provided to patient.     Michiel Cowboy, RN  12/15/2017

## 2017-12-15 ENCOUNTER — Ambulatory Visit (INDEPENDENT_AMBULATORY_CARE_PROVIDER_SITE_OTHER): Payer: Medicare Other | Admitting: *Deleted

## 2017-12-15 VITALS — BP 116/64 | HR 70 | Resp 18 | Ht 66.0 in | Wt 177.0 lb

## 2017-12-15 DIAGNOSIS — Z0000 Encounter for general adult medical examination without abnormal findings: Secondary | ICD-10-CM | POA: Diagnosis not present

## 2017-12-15 NOTE — Patient Instructions (Addendum)
Continue doing brain stimulating activities (puzzles, reading, adult coloring books, staying active) to keep memory sharp.   Continue to eat heart healthy diet (full of fruits, vegetables, whole grains, lean protein, water--limit salt, fat, and sugar intake) and increase physical activity as tolerated.   Andrew Foster , Thank you for taking time to come for your Medicare Wellness Visit. I appreciate your ongoing commitment to your health goals. Please review the following plan we discussed and let me know if I can assist you in the future.   These are the goals we discussed: Goals    . Patient Stated     Stay as healthy and as independent as possible and keep riding my Andrew Foster.       This is a list of the screening recommended for you and due dates:  Health Maintenance  Topic Date Due  . Flu Shot  04/01/2018  . Tetanus Vaccine  04/22/2022  . Pneumonia vaccines  Completed     Health Maintenance, Male A healthy lifestyle and preventive care is important for your health and wellness. Ask your health care provider about what schedule of regular examinations is right for you. What should I know about weight and diet? Eat a Healthy Diet  Eat plenty of vegetables, fruits, whole grains, low-fat dairy products, and lean protein.  Do not eat a lot of foods high in solid fats, added sugars, or salt.  Maintain a Healthy Weight Regular exercise can help you achieve or maintain a healthy weight. You should:  Do at least 150 minutes of exercise each week. The exercise should increase your heart rate and make you sweat (moderate-intensity exercise).  Do strength-training exercises at least twice a week.  Watch Your Levels of Cholesterol and Blood Lipids  Have your blood tested for lipids and cholesterol every 5 years starting at 78 years of age. If you are at high risk for heart disease, you should start having your blood tested when you are 78 years old. You may need to have your  cholesterol levels checked more often if: ? Your lipid or cholesterol levels are high. ? You are older than 78 years of age. ? You are at high risk for heart disease.  What should I know about cancer screening? Many types of cancers can be detected early and may often be prevented. Lung Cancer  You should be screened every year for lung cancer if: ? You are a current smoker who has smoked for at least 30 years. ? You are a former smoker who has quit within the past 15 years.  Talk to your health care provider about your screening options, when you should start screening, and how often you should be screened.  Colorectal Cancer  Routine colorectal cancer screening usually begins at 78 years of age and should be repeated every 5-10 years until you are 78 years old. You may need to be screened more often if early forms of precancerous polyps or small growths are found. Your health care provider may recommend screening at an earlier age if you have risk factors for colon cancer.  Your health care provider may recommend using home test kits to check for hidden blood in the stool.  A small camera at the end of a tube can be used to examine your colon (sigmoidoscopy or colonoscopy). This checks for the earliest forms of colorectal cancer.  Prostate and Testicular Cancer  Depending on your age and overall health, your health care provider may do certain  tests to screen for prostate and testicular cancer.  Talk to your health care provider about any symptoms or concerns you have about testicular or prostate cancer.  Skin Cancer  Check your skin from head to toe regularly.  Tell your health care provider about any new moles or changes in moles, especially if: ? There is a change in a mole's size, shape, or color. ? You have a mole that is larger than a pencil eraser.  Always use sunscreen. Apply sunscreen liberally and repeat throughout the day.  Protect yourself by wearing long sleeves,  pants, a wide-brimmed hat, and sunglasses when outside.  What should I know about heart disease, diabetes, and high blood pressure?  If you are 53-47 years of age, have your blood pressure checked every 3-5 years. If you are 56 years of age or older, have your blood pressure checked every year. You should have your blood pressure measured twice-once when you are at a hospital or clinic, and once when you are not at a hospital or clinic. Record the average of the two measurements. To check your blood pressure when you are not at a hospital or clinic, you can use: ? An automated blood pressure machine at a pharmacy. ? A home blood pressure monitor.  Talk to your health care provider about your target blood pressure.  If you are between 71-14 years old, ask your health care provider if you should take aspirin to prevent heart disease.  Have regular diabetes screenings by checking your fasting blood sugar level. ? If you are at a normal weight and have a low risk for diabetes, have this test once every three years after the age of 50. ? If you are overweight and have a high risk for diabetes, consider being tested at a younger age or more often.  A one-time screening for abdominal aortic aneurysm (AAA) by ultrasound is recommended for men aged 9-75 years who are current or former smokers. What should I know about preventing infection? Hepatitis B If you have a higher risk for hepatitis B, you should be screened for this virus. Talk with your health care provider to find out if you are at risk for hepatitis B infection. Hepatitis C Blood testing is recommended for:  Everyone born from [birth year] through 1966.  Anyone with known risk factors for hepatitis C.  Sexually Transmitted Diseases (STDs)  You should be screened each year for STDs including gonorrhea and chlamydia if: ? You are sexually active and are younger than 78 years of age. ? You are older than 78 years of age and your health care  provider tells you that you are at risk for this type of infection. ? Your sexual activity has changed since you were last screened and you are at an increased risk for chlamydia or gonorrhea. Ask your health care provider if you are at risk.  Talk with your health care provider about whether you are at high risk of being infected with HIV. Your health care provider may recommend a prescription medicine to help prevent HIV infection.  What else can I do?  Schedule regular health, dental, and eye exams.  Stay current with your vaccines (immunizations).  Do not use any tobacco products, such as cigarettes, chewing tobacco, and e-cigarettes. If you need help quitting, ask your health care provider.  Limit alcohol intake to no more than 2 drinks per day. One drink equals 12 ounces of beer, 5 ounces of Theophil Thivierge, or 1 ounces of hard  liquor.  Do not use street drugs.  Do not share needles.  Ask your health care provider for help if you need support or information about quitting drugs.  Tell your health care provider if you often feel depressed.  Tell your health care provider if you have ever been abused or do not feel safe at home. This information is not intended to replace advice given to you by your health care provider. Make sure you discuss any questions you have with your health care provider. Document Released: 02/14/2008 Document Revised: 04/16/2016 Document Reviewed: 05/22/2015 Elsevier Interactive Patient Education  [employer].

## 2017-12-15 NOTE — Progress Notes (Signed)
Medical screening examination/treatment/procedure(s) were performed by non-physician practitioner and as supervising physician I was immediately available for consultation/collaboration. I agree with above. Calub Tarnow A Nickson Middlesworth, MD 

## 2017-12-20 ENCOUNTER — Other Ambulatory Visit: Payer: Self-pay | Admitting: Internal Medicine

## 2017-12-21 NOTE — Telephone Encounter (Signed)
Control database checked last refill: 11/21/2017 LOV:  Acute 06/09/2017  Last CPE: 09/13/2015

## 2018-02-27 ENCOUNTER — Other Ambulatory Visit: Payer: Self-pay | Admitting: Internal Medicine

## 2018-03-02 NOTE — Telephone Encounter (Signed)
Control database checked last refill: 02/01/2018 LOV: 06/09/2017

## 2018-03-31 ENCOUNTER — Telehealth: Payer: Self-pay | Admitting: Internal Medicine

## 2018-03-31 NOTE — Telephone Encounter (Signed)
Arthralgia of multiple joints (719.49)???

## 2018-03-31 NOTE — Telephone Encounter (Signed)
Needs visit to determine. No routine visit since 2017.

## 2018-03-31 NOTE — Telephone Encounter (Signed)
Can you please make patient an appointment. Thank you  

## 2018-03-31 NOTE — Telephone Encounter (Signed)
Copied from [place] [id number]. Topic: Quick Communication - See Telephone Encounter >> Mar 31, 2018  9:34 AM Percell Belt A wrote: CRM for notification. See Telephone encounter for: 03/31/18  Faythe Dingwall with  [facility] in [place] called in and is needing a Diagnosis code to enter in their system as to why pt is taking the traMADol (ULTRAM) 50 MG tablet [[id number]] .They stated they are starting to need those now with certain meds.   Best number [phone]

## 2018-04-28 ENCOUNTER — Other Ambulatory Visit: Payer: Self-pay | Admitting: Internal Medicine

## 2018-04-29 ENCOUNTER — Other Ambulatory Visit: Payer: Self-pay | Admitting: Internal Medicine

## 2018-04-29 NOTE — Telephone Encounter (Signed)
Control database checked last refill: 03/31/2018 LOV: 06/09/2017 NOV: none

## 2018-04-29 NOTE — Telephone Encounter (Signed)
Appt scheduled

## 2018-04-29 NOTE — Telephone Encounter (Signed)
Needs to schedule follow up by Oct when he needs refills.

## 2018-04-29 NOTE — Telephone Encounter (Signed)
Can you please schedule patient an appointment for follow up by October. Thank you

## 2018-05-04 ENCOUNTER — Telehealth: Payer: Self-pay | Admitting: Internal Medicine

## 2018-05-04 MED ORDER — TAMSULOSIN HCL 0.4 MG PO CAPS: 0.4000 mg | ORAL_CAPSULE | Freq: Every day | ORAL | 0 refills | Status: DC

## 2018-05-04 NOTE — Addendum Note (Signed)
Addended by: Raford Pitcher R on: 05/04/2018 03:14 PM   Modules accepted: Orders

## 2018-05-04 NOTE — Telephone Encounter (Signed)
Copied from [place] [id number]. Topic: General - Other >> May 04, 2018 11:40 AM Carolyn Stare wrote:  Pt contacted [facility] and they told him they did not have the below RX       tamsulosin (FLOMAX) 0.4 MG CAPS capsule

## 2018-06-18 ENCOUNTER — Ambulatory Visit (INDEPENDENT_AMBULATORY_CARE_PROVIDER_SITE_OTHER): Payer: Medicare Other | Admitting: Internal Medicine

## 2018-06-18 ENCOUNTER — Encounter: Payer: Self-pay | Admitting: Internal Medicine

## 2018-06-18 ENCOUNTER — Other Ambulatory Visit (INDEPENDENT_AMBULATORY_CARE_PROVIDER_SITE_OTHER): Payer: Medicare Other

## 2018-06-18 VITALS — BP 118/80 | HR 78 | Temp 97.9°F | Ht 66.0 in | Wt 172.0 lb

## 2018-06-18 DIAGNOSIS — K219 Gastro-esophageal reflux disease without esophagitis: Secondary | ICD-10-CM

## 2018-06-18 DIAGNOSIS — M1990 Unspecified osteoarthritis, unspecified site: Secondary | ICD-10-CM | POA: Diagnosis not present

## 2018-06-18 DIAGNOSIS — E782 Mixed hyperlipidemia: Secondary | ICD-10-CM | POA: Diagnosis not present

## 2018-06-18 DIAGNOSIS — K439 Ventral hernia without obstruction or gangrene: Secondary | ICD-10-CM | POA: Diagnosis not present

## 2018-06-18 DIAGNOSIS — R7309 Other abnormal glucose: Secondary | ICD-10-CM

## 2018-06-18 DIAGNOSIS — R972 Elevated prostate specific antigen [PSA]: Secondary | ICD-10-CM

## 2018-06-18 LAB — COMPREHENSIVE METABOLIC PANEL
ALK PHOS: 64 U/L (ref 39–117)
ALT: 20 U/L (ref 0–53)
AST: 20 U/L (ref 0–37)
Albumin: 4.4 g/dL (ref 3.5–5.2)
BILIRUBIN TOTAL: 0.5 mg/dL (ref 0.2–1.2)
BUN: 17 mg/dL (ref 6–23)
CO2: 27 mEq/L (ref 19–32)
Calcium: 10.1 mg/dL (ref 8.4–10.5)
Chloride: 103 mEq/L (ref 96–112)
Creatinine, Ser: 0.81 mg/dL (ref 0.40–1.50)
GFR: 97.92 mL/min (ref >60.00)
Glucose, Bld: 95 mg/dL (ref 70–99)
POTASSIUM: 4.4 meq/L (ref 3.5–5.1)
SODIUM: 140 meq/L (ref 135–145)
TOTAL PROTEIN: 7 g/dL (ref 6.0–8.3)

## 2018-06-18 LAB — CBC
HEMATOCRIT: 45.3 % (ref 39.0–52.0)
Hemoglobin: 15.4 g/dL (ref 13.0–17.0)
MCHC: 34.1 g/dL (ref 30.0–36.0)
MCV: 95.9 fl (ref 78.0–100.0)
Platelets: 266 10*3/uL (ref 150.0–400.0)
RBC: 4.72 Mil/uL (ref 4.22–5.81)
RDW: 12.5 % (ref 11.5–15.5)
WBC: 6.2 10*3/uL (ref 4.0–10.5)

## 2018-06-18 LAB — LIPID PANEL
CHOLESTEROL: 190 mg/dL (ref 0–200)
HDL: 42.3 mg/dL (ref >39.00)
LDL Cholesterol: 112 mg/dL — ABNORMAL HIGH (ref 0–99)
NONHDL: 147.44
Total CHOL/HDL Ratio: 4
Triglycerides: 177 mg/dL — ABNORMAL HIGH (ref 0.0–149.0)
VLDL: 35.4 mg/dL (ref 0.0–40.0)

## 2018-06-18 LAB — HEMOGLOBIN A1C: Hgb A1c MFr Bld: 6.2 % (ref 4.6–6.5)

## 2018-06-18 LAB — PSA: PSA: 4.08 ng/mL — AB (ref 0.10–4.00)

## 2018-06-18 NOTE — Assessment & Plan Note (Signed)
Using meloxicam and tramadol. Not taking outside prescribed. Reminded about risk of addiction, falls, memory change and he wishes to continue given increase in QOL.

## 2018-06-18 NOTE — Patient Instructions (Signed)
We will check the labs today. 

## 2018-06-18 NOTE — Assessment & Plan Note (Signed)
Checking lipid panel and adjust as needed. Controlled with diet.

## 2018-06-18 NOTE — Assessment & Plan Note (Signed)
Some more pain symptoms but still rare. Will call back with worsening.

## 2018-06-18 NOTE — Assessment & Plan Note (Signed)
Taking omeprazole and at goal.

## 2018-06-18 NOTE — Progress Notes (Signed)
   Subjective:    Patient ID: Andrew Foster, male    DOB: 23-Oct-1940, 77 y.o.   MRN: [id number]  HPI The patient is a 77 YO man coming in for follow up GERD (using omeprazole daily, can tell if he misses doses), arthritis (taking meloxicam and tramadol, using as prescribed, if he misses he notices, mostly his shoulders, some back pain) and BPH (taking flomax with good control of symptoms, elevated PSA in the past, not following with urology anymore). Denies new concerns.   Review of Systems  Constitutional: Negative.   HENT: Negative.   Eyes: Negative.   Respiratory: Negative for cough, chest tightness and shortness of breath.   Cardiovascular: Negative for chest pain, palpitations and leg swelling.  Gastrointestinal: Negative for abdominal distention, abdominal pain, constipation, diarrhea, nausea and vomiting.  Musculoskeletal: Negative.   Skin: Negative.   Neurological: Negative.   Psychiatric/Behavioral: Negative.       Objective:   Physical Exam  Constitutional: He is oriented to person, place, and time. He appears well-developed and well-nourished.  HENT:  Head: Normocephalic and atraumatic.  Eyes: EOM are normal.  Neck: Normal range of motion.  Cardiovascular: Normal rate and regular rhythm.  Pulmonary/Chest: Effort normal and breath sounds normal. No respiratory distress. He has no wheezes. He has no rales.  Abdominal: Soft. Bowel sounds are normal. He exhibits no distension. There is no tenderness. There is no rebound.  Musculoskeletal: He exhibits no edema.  Neurological: He is alert and oriented to person, place, and time. Coordination normal.  Skin: Skin is warm and dry.  Psychiatric: He has a normal mood and affect.   Vitals:   06/18/18 0949  BP: 118/80  Pulse: 78  Temp: 97.9 F (36.6 C)  TempSrc: Oral  SpO2: 95%  Weight: 172 lb (78 kg)  Height: 5\' 6"  (1.676 m)      Assessment & Plan:

## 2018-06-18 NOTE — Assessment & Plan Note (Signed)
Taking flomax, checking PSA.

## 2018-06-18 NOTE — Assessment & Plan Note (Signed)
Checking HgA1c. 

## 2018-06-20 ENCOUNTER — Other Ambulatory Visit: Payer: Self-pay | Admitting: Internal Medicine

## 2018-07-20 ENCOUNTER — Other Ambulatory Visit: Payer: Self-pay | Admitting: Internal Medicine

## 2018-07-20 NOTE — Telephone Encounter (Signed)
Control database checked last refill: 03/31/2018 LOV: 06/18/2018 NOV: none

## 2018-08-10 DIAGNOSIS — Z23 Encounter for immunization: Secondary | ICD-10-CM | POA: Diagnosis not present

## 2018-10-21 ENCOUNTER — Other Ambulatory Visit: Payer: Self-pay | Admitting: Internal Medicine

## 2018-10-21 NOTE — Telephone Encounter (Signed)
Called pharmacy last refill 09/21/2018 for 30 tabs LOV: 06/18/2017 WTK:TCCE

## 2018-10-23 ENCOUNTER — Other Ambulatory Visit: Payer: Self-pay | Admitting: Internal Medicine

## 2018-10-25 NOTE — Telephone Encounter (Signed)
Control database checked last refill: 09/21/2018 LOV: 06/18/2018 NOV:12/17/2018

## 2018-11-29 ENCOUNTER — Telehealth: Payer: Self-pay | Admitting: *Deleted

## 2018-11-29 NOTE — Telephone Encounter (Signed)
Called patient and LVM to inform them the nurse needs to either convert their upcoming AWV to a virtual visit or reschedule the visit out into the future due to covid-19 safety measures. Nurse requested that the patient call-back and stated she would call them back at a later date.    

## 2018-12-16 NOTE — Progress Notes (Signed)
Subjective:   Andrew Foster is a 78 y.o. male who presents for Medicare Annual/Subsequent preventive examination.  I connected with patient 12/17/18 at  9:00 AM EDT by a video enabled telemedicine application and verified that I am speaking with the correct person using two identifiers. Patient stated full name and DOB. Patient gave permission to continue with virtual visit. Patient's location was at home and Nurse's location was at [place] office.   Review of Systems:  No ROS.  Medicare Wellness Visit. Additional risk factors are reflected in the social history.  Cardiac Risk Factors include: advanced age (>27men, >39 women);male gender;hypertension Sleep patterns: has interrupted sleep, gets up 2-3 times nightly to void and sleeps 6-7 hours nightly.    Home Safety/Smoke Alarms: Feels safe in home. Smoke alarms in place.  Living environment; residence and Firearm Safety: 1-story house/ trailer. Lives alone , no needs for DME, good support system. Son lives next door and sister lives across the street. Seat Belt Safety/Bike Helmet: Wears seat belt.     Objective:    Vitals: There were no vitals taken for this visit.  There is no height or weight on file to calculate BMI.  Advanced Directives 12/17/2018 12/15/2017 02/04/2017 12/10/2016 11/02/2016 11/01/2016  Does Patient Have a Medical Advance Directive? No Yes No No No No  Type of Advance Directive - Healthcare Power of [place];Living will - - - -  Does patient want to make changes to medical advance directive? Yes (ED - Information included in AVS) - - Yes (ED - Information included in AVS) - -  Copy of Healthcare Power of Attorney in Chart? - No - copy requested - - - -  Would patient like information on creating a medical advance directive? - - - - No - Patient declined -    Tobacco Social History   Tobacco Use  Smoking Status Former Smoker  . Last attempt to quit: 09/01/1974  . Years since quitting: 44.3  Smokeless Tobacco Never  Used  Tobacco Comment   smoked 1956-1976, up to 3 ppd     Counseling given: Not Answered Comment: smoked 1956-1976, up to 3 ppd  Past Medical History:  Diagnosis Date  . Acute pyelonephritis 11/01/2016  . Adenomatous polyps of colon 01/2017   no f/u - age  . BPH (benign prostatic hyperplasia)   . Diverticulosis   . Hypertension    Past Surgical History:  Procedure Laterality Date  . CARPAL TUNNEL RELEASE    . COLONOSCOPY     Tics; [facility]  . HERNIA REPAIR    . intra articular steroids     R shoulder  . PROSTATE BIOPSY     Dr Janice Norrie  . VASECTOMY     Family History  Problem Relation Age of Onset  . Heart attack Mother 71  . Diabetes Mother   . Diabetes Sister        TWO sisters  . Diabetes Brother        TWO brothers  . Diabetes type I Brother   . Stroke Neg Hx   . Cancer Neg Hx   . Colon cancer Neg Hx    Social History   Socioeconomic History  . Marital status: Widowed    Spouse name: Not on file  . Number of children: 4  . Years of education: Not on file  . Highest education level: Not on file  Occupational History  . Not on file  Social Needs  . Financial resource strain: Not  hard at all  . Food insecurity:    Worry: Never true    Inability: Never true  . Transportation needs:    Medical: No    Non-medical: No  Tobacco Use  . Smoking status: Former Smoker    Last attempt to quit: 09/01/1974    Years since quitting: 44.3  . Smokeless tobacco: Never Used  . Tobacco comment: smoked 1956-1976, up to 3 ppd  Substance and Sexual Activity  . Alcohol use: No  . Drug use: No  . Sexual activity: Not Currently  Lifestyle  . Physical activity:    Days per week: 3 days    Minutes per session: 40 min  . Stress: Not at all  Relationships  . Social connections:    Talks on phone: More than three times a week    Gets together: More than three times a week    Attends religious service: More than 4 times per year    Active member of club or organization: Yes     Attends meetings of clubs or organizations: More than 4 times per year    Relationship status: Widowed  Other Topics Concern  . Not on file  Social History Narrative  . Not on file    Outpatient Encounter Medications as of 12/17/2018  Medication Sig  . aspirin 81 MG tablet Take 81 mg by mouth daily.  . meclizine (ANTIVERT) 12.5 MG tablet TAKE 1 TABLET BY MOUTH THREE TIMES DAILY AS NEEDED FOR  DIZZINESS  . meloxicam (MOBIC) 15 MG tablet TAKE 1 TABLET BY MOUTH ONCE DAILY  . Multiple Vitamin (MULTIVITAMIN) tablet Take 1 tablet by mouth daily.   . Omega-3 Fatty Acids (FISH OIL PO) Take 1 capsule by mouth daily.  Marland Kitchen omeprazole (PRILOSEC OTC) 20 MG tablet Take 20 mg by mouth daily.   . tamsulosin (FLOMAX) 0.4 MG CAPS capsule Take 1 capsule (0.4 mg total) by mouth at bedtime.  . traMADol (ULTRAM) 50 MG tablet TAKE 1 TABLET BY MOUTH EVERY 12 HOURS AS NEEDED  . Vitamin D, Cholecalciferol, 1000 units CAPS Take by mouth.   No facility-administered encounter medications on file as of 12/17/2018.     Activities of Daily Living In your present state of health, do you have any difficulty performing the following activities: 12/17/2018  Hearing? N  Vision? N  Difficulty concentrating or making decisions? N  Walking or climbing stairs? N  Dressing or bathing? N  Doing errands, shopping? N  Preparing Food and eating ? N  Using the Toilet? N  In the past six months, have you accidently leaked urine? N  Do you have problems with loss of bowel control? N  Managing your Medications? N  Managing your Finances? N  Housekeeping or managing your Housekeeping? N  Some recent data might be hidden    Patient Care Team: Hoyt Koch, MD as PCP - General (Internal Medicine)   Assessment:   This is a routine wellness examination for [name]. Physical assessment deferred to PCP.   Exercise Activities and Dietary recommendations Current Exercise Habits: The patient does not participate in  regular exercise at present(stays active by doing house projects)  Diet (meal preparation, eat out, water intake, caffeinated beverages, dairy products, fruits and vegetables): in general, a "healthy" diet  , well balanced   Encouraged patient to increase daily water and healthy fluid intake. Reviewed heart healthy diet.  Goals    . lose weight to feel healthy     Start to walk  daily, eat healthy, and decrease portion size.     . Patient Stated     Stay as healthy and as independent as possible and keep riding my [name].       Fall Risk Fall Risk  12/17/2018 12/15/2017 12/10/2016 05/01/2016 04/30/2016  Falls in the past year? 1 No No Yes No  Comment - - - - Emmi Telephone Survey: data to providers prior to load  Number falls in past yr: 0 - - 1 -  Injury with Fall? - - - No -  Follow up Falls prevention discussed;Education provided - - - -    Depression Screen PHQ 2/9 Scores 12/17/2018 12/15/2017 12/10/2016 05/01/2016  PHQ - 2 Score 0 1 0 0  PHQ- 9 Score - 2 - -    Cognitive Function MMSE - Mini Mental State Exam 12/15/2017  Orientation to time 5  Orientation to Place 5  Registration 3  Attention/ Calculation 5  Recall 1  Language- name 2 objects 2  Language- repeat 1  Language- follow 3 step command 3  Language- read & follow direction 1  Write a sentence 1  Copy design 1  Total score 28       Ad8 score reviewed for issues:  Issues making decisions: no  Less interest in hobbies / activities: no  Repeats questions, stories (family complaining): no  Trouble using ordinary gadgets (microwave, computer, phone):no  Forgets the month or year: no  Mismanaging finances: no  Remembering appts: no  Daily problems with thinking and/or memory: no Ad8 score is= 0  States he is forgetful at times but he is not concerned about any cognitive issues.  Immunization History  Administered Date(s) Administered  . Influenza Whole 07/13/2007  . Influenza, High Dose  Seasonal PF 06/09/2017  . Influenza,inj,Quad PF,6+ Mos 05/01/2016  . Influenza-Unspecified 10/03/2015, 08/10/2018  . Pneumococcal Conjugate-13 12/10/2016  . Pneumococcal Polysaccharide-23 10/03/2015  . Td 04/22/2012   Screening Tests Health Maintenance  Topic Date Due  . INFLUENZA VACCINE  04/02/2019  . TETANUS/TDAP  04/22/2022  . PNA vac Low Risk Adult  Completed      Plan:    Reviewed health maintenance screenings with patient today and relevant education, vaccines, and/or referrals were provided.   Continue to eat heart healthy diet (full of fruits, vegetables, whole grains, lean protein, water--limit salt, fat, and sugar intake) and increase physical activity as tolerated.  Continue doing brain stimulating activities (puzzles, reading, adult coloring books, staying active) to keep memory sharp.   I have personally reviewed and noted the following in the patient's chart:   . Medical and social history . Use of alcohol, tobacco or illicit drugs  . Current medications and supplements . Functional ability and status . Nutritional status . Physical activity . Advanced directives . List of other physicians . Vitals . Screenings to include cognitive, depression, and falls . Referrals and appointments  In addition, I have reviewed and discussed with patient certain preventive protocols, quality metrics, and best practice recommendations. A written personalized care plan for preventive services as well as general preventive health recommendations were provided to patient.     Michiel Cowboy, RN  12/17/2018

## 2018-12-17 ENCOUNTER — Ambulatory Visit (INDEPENDENT_AMBULATORY_CARE_PROVIDER_SITE_OTHER): Payer: Medicare Other | Admitting: *Deleted

## 2018-12-17 DIAGNOSIS — Z0000 Encounter for general adult medical examination without abnormal findings: Secondary | ICD-10-CM

## 2018-12-17 NOTE — Patient Instructions (Signed)
Continue doing brain stimulating activities (puzzles, reading, adult coloring books, staying active) to keep memory sharp.   Continue to eat heart healthy diet (full of fruits, vegetables, whole grains, lean protein, water--limit salt, fat, and sugar intake) and increase physical activity as tolerated.   [name] , Thank you for taking time to come for your Medicare Wellness Visit. I appreciate your ongoing commitment to your health goals. Please review the following plan we discussed and let me know if I can assist you in the future.   These are the goals we discussed: Goals    . lose weight to feel healthy     Start to walk daily, eat healthy, and decrease portion size.     . Patient Stated     Stay as healthy and as independent as possible and keep riding my [name].    . Patient Stated     Stay physically active, eat healthy, and enjoy my family and dogs.       This is a list of the screening recommended for you and due dates:  Health Maintenance  Topic Date Due  . Flu Shot  04/02/2019  . Tetanus Vaccine  04/22/2022  . Pneumonia vaccines  Completed    Preventive Care 39 Years and Older, Male Preventive care refers to lifestyle choices and visits with your health care provider that can promote health and wellness. What does preventive care include?   A yearly physical exam. This is also called an annual well check.  Dental exams once or twice a year.  Routine eye exams. Ask your health care provider how often you should have your eyes checked.  Personal lifestyle choices, including: ? Daily care of your teeth and gums. ? Regular physical activity. ? Eating a healthy diet. ? Avoiding tobacco and drug use. ? Limiting alcohol use. ? Practicing safe sex. ? Taking low doses of aspirin every day. ? Taking vitamin and mineral supplements as recommended by your health care provider. What happens during an annual well check? The services and screenings done by  your health care provider during your annual well check will depend on your age, overall health, lifestyle risk factors, and family history of disease. Counseling Your health care provider may ask you questions about your:  Alcohol use.  Tobacco use.  Drug use.  Emotional well-being.  Home and relationship well-being.  Sexual activity.  Eating habits.  History of falls.  Memory and ability to understand (cognition).  Work and work Statistician. Screening You may have the following tests or measurements:  Height, weight, and BMI.  Blood pressure.  Lipid and cholesterol levels. These may be checked every 5 years, or more frequently if you are over 90 years old.  Skin check.  Lung cancer screening. You may have this screening every year starting at age 23 if you have a 30-pack-year history of smoking and currently smoke or have quit within the past 15 years.  Colorectal cancer screening. All adults should have this screening starting at age 14 and continuing until age 24. You will have tests every 1-10 years, depending on your results and the type of screening test. People at increased risk should start screening at an earlier age. Screening tests may include: ? Guaiac-based fecal occult blood testing. ? Fecal immunochemical test (FIT). ? Stool DNA test. ? Virtual colonoscopy. ? Sigmoidoscopy. During this test, a flexible tube with a tiny camera (sigmoidoscope) is used to examine your rectum and lower colon. The sigmoidoscope is inserted  through your anus into your rectum and lower colon. ? Colonoscopy. During this test, a long, thin, flexible tube with a tiny camera (colonoscope) is used to examine your entire colon and rectum.  Prostate cancer screening. Recommendations will vary depending on your family history and other risks.  Hepatitis C blood test.  Hepatitis B blood test.  Sexually transmitted disease (STD) testing.  Diabetes screening. This is done by checking  your blood sugar (glucose) after you have not eaten for a while (fasting). You may have this done every 1-3 years.  Abdominal aortic aneurysm (AAA) screening. You may need this if you are a current or former smoker.  Osteoporosis. You may be screened starting at age 69 if you are at high risk. Talk with your health care provider about your test results, treatment options, and if necessary, the need for more tests. Vaccines Your health care provider may recommend certain vaccines, such as:  Influenza vaccine. This is recommended every year.  Tetanus, diphtheria, and acellular pertussis (Tdap, Td) vaccine. You may need a Td booster every 10 years.  Varicella vaccine. You may need this if you have not been vaccinated.  Zoster vaccine. You may need this after age 70.  Measles, mumps, and rubella (MMR) vaccine. You may need at least one dose of MMR if you were born in 1957 or later. You may also need a second dose.  Pneumococcal 13-valent conjugate (PCV13) vaccine. One dose is recommended after age 78.  Pneumococcal polysaccharide (PPSV23) vaccine. One dose is recommended after age 84.  Meningococcal vaccine. You may need this if you have certain conditions.  Hepatitis A vaccine. You may need this if you have certain conditions or if you travel or work in places where you may be exposed to hepatitis A.  Hepatitis B vaccine. You may need this if you have certain conditions or if you travel or work in places where you may be exposed to hepatitis B.  Haemophilus influenzae type b (Hib) vaccine. You may need this if you have certain risk factors. Talk to your health care provider about which screenings and vaccines you need and how often you need them. This information is not intended to replace advice given to you by your health care provider. Make sure you discuss any questions you have with your health care provider. Document Released: 09/14/2015 Document Revised: 10/08/2017 Document  Reviewed: 06/19/2015 Elsevier Interactive Patient Education  2019 [employer].

## 2018-12-17 NOTE — Progress Notes (Signed)
Medical screening examination/treatment/procedure(s) were performed by non-physician practitioner and as supervising physician I was immediately available for consultation/collaboration. I agree with above. Rodina Pinales A Oiva Dibari, MD 

## 2019-01-20 ENCOUNTER — Other Ambulatory Visit: Payer: Self-pay | Admitting: Internal Medicine

## 2019-02-01 DIAGNOSIS — R972 Elevated prostate specific antigen [PSA]: Secondary | ICD-10-CM | POA: Diagnosis not present

## 2019-02-07 DIAGNOSIS — R350 Frequency of micturition: Secondary | ICD-10-CM | POA: Diagnosis not present

## 2019-02-07 DIAGNOSIS — N401 Enlarged prostate with lower urinary tract symptoms: Secondary | ICD-10-CM | POA: Diagnosis not present

## 2019-02-23 ENCOUNTER — Other Ambulatory Visit: Payer: Self-pay | Admitting: Internal Medicine

## 2019-02-23 NOTE — Telephone Encounter (Signed)
[place] Controlled Database Checked Last filled: 01/20/19 # 30 LOV w/you: 06/18/18 Next appt w/you: 12/19/19

## 2019-03-28 ENCOUNTER — Other Ambulatory Visit: Payer: Medicare Other

## 2019-03-28 ENCOUNTER — Other Ambulatory Visit: Payer: Self-pay

## 2019-03-28 ENCOUNTER — Ambulatory Visit: Payer: Self-pay

## 2019-03-28 ENCOUNTER — Ambulatory Visit (INDEPENDENT_AMBULATORY_CARE_PROVIDER_SITE_OTHER): Payer: Medicare Other | Admitting: Family Medicine

## 2019-03-28 ENCOUNTER — Encounter: Payer: Self-pay | Admitting: Family

## 2019-03-28 ENCOUNTER — Encounter: Payer: Self-pay | Admitting: Family Medicine

## 2019-03-28 ENCOUNTER — Ambulatory Visit (INDEPENDENT_AMBULATORY_CARE_PROVIDER_SITE_OTHER): Payer: Medicare Other | Admitting: Family

## 2019-03-28 VITALS — BP 120/76 | Wt 168.0 lb

## 2019-03-28 VITALS — BP 120/76 | HR 78 | Temp 98.3°F | Ht 66.0 in | Wt 168.1 lb

## 2019-03-28 DIAGNOSIS — M25521 Pain in right elbow: Secondary | ICD-10-CM

## 2019-03-28 DIAGNOSIS — M7021 Olecranon bursitis, right elbow: Secondary | ICD-10-CM | POA: Diagnosis not present

## 2019-03-28 MED ORDER — DOXYCYCLINE HYCLATE 100 MG PO TABS: 100.0000 mg | ORAL_TABLET | Freq: Two times a day (BID) | ORAL | 0 refills | Status: DC

## 2019-03-28 NOTE — Patient Instructions (Signed)
Call if any worse See me again in 10 days

## 2019-03-28 NOTE — Progress Notes (Signed)
Andrew Foster is a 78 y.o. male with the following history as recorded in Epic[facility]:  Patient Active Problem List   Diagnosis Date Noted  . Arthritis 06/18/2018  . Ventral hernia 06/10/2017  . Routine general medical examination at a health care facility 09/14/2015  . Elevated alkaline phosphatase level 10/19/2014  . Mixed hyperlipidemia 02/28/2013  . ELEVATED PROSTATE SPECIFIC ANTIGEN 11/06/2008  . Esophageal reflux 05/01/2008  . HYPERGLYCEMIA, FASTING 05/01/2008    Current Outpatient Medications  Medication Sig Dispense Refill  . aspirin 81 MG tablet Take 81 mg by mouth daily.    . meclizine (ANTIVERT) 12.5 MG tablet TAKE 1 TABLET BY MOUTH THREE TIMES DAILY AS NEEDED FOR  DIZZINESS 30 tablet 1  . meloxicam (MOBIC) 15 MG tablet TAKE 1 TABLET BY MOUTH ONCE DAILY 90 tablet 3  . Multiple Vitamin (MULTIVITAMIN) tablet Take 1 tablet by mouth daily.     . Omega-3 Fatty Acids (FISH OIL PO) Take 1 capsule by mouth daily.    Marland Kitchen omeprazole (PRILOSEC OTC) 20 MG tablet Take 20 mg by mouth daily.     . tamsulosin (FLOMAX) 0.4 MG CAPS capsule Take 1 capsule by mouth at bedtime 90 capsule 1  . traMADol (ULTRAM) 50 MG tablet TAKE 1 TABLET BY MOUTH EVERY 12 HOURS AS NEEDED 30 tablet 2  . Vitamin D, Cholecalciferol, 1000 units CAPS Take by mouth.    . doxycycline (VIBRA-TABS) 100 MG tablet Take 1 tablet (100 mg total) by mouth 2 (two) times daily. 20 tablet 0   No current facility-administered medications for this visit.     Allergies: Patient has no known allergies.  Past Medical History:  Diagnosis Date  . Acute pyelonephritis 11/01/2016  . Adenomatous polyps of colon 01/2017   no f/u - age  . BPH (benign prostatic hyperplasia)   . Diverticulosis   . Hypertension     Past Surgical History:  Procedure Laterality Date  . CARPAL TUNNEL RELEASE    . COLONOSCOPY     Tics; [facility]  . HERNIA REPAIR    . intra articular steroids     R shoulder  . PROSTATE BIOPSY     Dr Janice Norrie  . VASECTOMY       Family History  Problem Relation Age of Onset  . Heart attack Mother 66  . Diabetes Mother   . Diabetes Sister        TWO sisters  . Diabetes Brother        TWO brothers  . Diabetes type I Brother   . Stroke Neg Hx   . Cancer Neg Hx   . Colon cancer Neg Hx     Social History   Tobacco Use  . Smoking status: Former Smoker    Quit date: 09/01/1974    Years since quitting: 44.6  . Smokeless tobacco: Never Used  . Tobacco comment: smoked 1956-1976, up to 3 ppd  Substance Use Topics  . Alcohol use: No    Subjective:  Patient presents with concerns for sudden onset swelling/ redness of his right elbow x 1-2 days; denies any known injury or trauma; does like to work on old cars and this can involve repetitive motion; no fever, no numbness or tingling; has not taken anything for symptoms- notes his son saw the elbow today and recommended he get in and get checked to make sure it wasn't a spider bite.      Objective:  Vitals:   03/28/19 1318  BP: 120/76  Pulse:  78  Temp: 98.3 F (36.8 C)  TempSrc: Oral  SpO2: 95%  Weight: 168 lb 1.3 oz (76.2 kg)  Height: 5\' 6"  (1.676 m)    General: Well developed, well nourished, in no acute distress  Skin : Warm and dry.  Head: Normocephalic and atraumatic  Lungs: Respirations unlabored;  Musculoskeletal: No deformities; marked redness/ swelling noted over right elbow; FROM on active motion;  Extremities: No edema, cyanosis, clubbing  Vessels: Symmetric bilaterally  Neurologic: Alert and oriented; speech intact; face symmetrical; moves all extremities well; CNII-XII intact without focal deficit   Assessment:  1. Olecranon bursitis of right elbow     Plan:  Immediate referral to our sports medicine provider for treatment; follow-up as needed otherwise.   No follow-ups on file.  No orders of the defined types were placed in this encounter.   Requested Prescriptions    No prescriptions requested or ordered in this encounter

## 2019-03-28 NOTE — Progress Notes (Signed)
[facility] [address] [place], [place] [address] Phone: [phone] Subjective:   I Andrew Foster am serving as a Education administrator for Dr. Hulan Saas.  I'm seeing this patient by the request  of:  Hoyt Koch, MD    CC: Right elbow swelling  [id number]  Andrew Foster is a 78 y.o. male coming in with complaint of right elbow pain. States he just randomly saw a knot on his elbow. States the elbow is a little sore.   Onset- acute  Patient son noticed that he had swelling today.  Was told to come in.  Patient stated that it is warm to touch.  Denies any fevers chills.  Does not remember being bitten by anything.  Has not noticed any discharge from the area    Past Medical History:  Diagnosis Date  . Acute pyelonephritis 11/01/2016  . Adenomatous polyps of colon 01/2017   no f/u - age  . BPH (benign prostatic hyperplasia)   . Diverticulosis   . Hypertension    Past Surgical History:  Procedure Laterality Date  . CARPAL TUNNEL RELEASE    . COLONOSCOPY     Tics; [facility]  . HERNIA REPAIR    . intra articular steroids     R shoulder  . PROSTATE BIOPSY     Dr Janice Norrie  . VASECTOMY     Social History   Socioeconomic History  . Marital status: Widowed    Spouse name: Not on file  . Number of children: 4  . Years of education: Not on file  . Highest education level: Not on file  Occupational History  . Not on file  Social Needs  . Financial resource strain: Not hard at all  . Food insecurity    Worry: Never true    Inability: Never true  . Transportation needs    Medical: No    Non-medical: No  Tobacco Use  . Smoking status: Former Smoker    Quit date: 09/01/1974    Years since quitting: 44.6  . Smokeless tobacco: Never Used  . Tobacco comment: smoked 1956-1976, up to 3 ppd  Substance and Sexual Activity  . Alcohol use: No  . Drug use: No  . Sexual activity: Not Currently  Lifestyle  . Physical activity    Days per week: 3  days    Minutes per session: 40 min  . Stress: Not at all  Relationships  . Social connections    Talks on phone: More than three times a week    Gets together: More than three times a week    Attends religious service: More than 4 times per year    Active member of club or organization: Yes    Attends meetings of clubs or organizations: More than 4 times per year    Relationship status: Widowed  Other Topics Concern  . Not on file  Social History Narrative  . Not on file   No Known Allergies Family History  Problem Relation Age of Onset  . Heart attack Mother 5  . Diabetes Mother   . Diabetes Sister        TWO sisters  . Diabetes Brother        TWO brothers  . Diabetes type I Brother   . Stroke Neg Hx   . Cancer Neg Hx   . Colon cancer Neg Hx        Current Outpatient Medications (Analgesics):  .  aspirin  81 MG tablet, Take 81 mg by mouth daily. .  meloxicam (MOBIC) 15 MG tablet, TAKE 1 TABLET BY MOUTH ONCE DAILY .  traMADol (ULTRAM) 50 MG tablet, TAKE 1 TABLET BY MOUTH EVERY 12 HOURS AS NEEDED   Current Outpatient Medications (Other):  .  doxycycline (VIBRA-TABS) 100 MG tablet, Take 1 tablet (100 mg total) by mouth 2 (two) times daily. .  meclizine (ANTIVERT) 12.5 MG tablet, TAKE 1 TABLET BY MOUTH THREE TIMES DAILY AS NEEDED FOR  DIZZINESS .  Multiple Vitamin (MULTIVITAMIN) tablet, Take 1 tablet by mouth daily.  .  Omega-3 Fatty Acids (FISH OIL PO), Take 1 capsule by mouth daily. Marland Kitchen  omeprazole (PRILOSEC OTC) 20 MG tablet, Take 20 mg by mouth daily.  .  tamsulosin (FLOMAX) 0.4 MG CAPS capsule, Take 1 capsule by mouth at bedtime .  Vitamin D, Cholecalciferol, 1000 units CAPS, Take by mouth.    Past medical history, social, surgical and family history all reviewed in electronic medical record.  No pertanent information unless stated regarding to the chief complaint.   Review of Systems:  No headache, visual changes, nausea, vomiting, diarrhea, constipation,  dizziness, abdominal pain, skin rash, fevers, chills, night sweats, weight loss, swollen lymph nodes, body aches, joint swelling,  chest pain, shortness of breath, mood changes.  Positive muscle aches  Objective  Blood pressure 120/76, weight 168 lb (76.2 kg).    General: No apparent distress alert and oriented x3 mood and affect normal, dressed appropriately.  HEENT: Pupils equal, extraocular movements intact  Respiratory: Patient's speak in full sentences and does not appear short of breath  Cardiovascular: No lower extremity edema, non tender, no erythema  Skin: Warm dry intact with no signs of infection or rash on extremities or on axial skeleton.  Abdomen: Soft nontender  Neuro: Cranial nerves II through XII are intact, neurovascularly intact in all extremities with 2+ DTRs and 2+ pulses.  Lymph: No lymphadenopathy of posterior or anterior cervical chain or axillae bilaterally.  Gait normal with good balance and coordination.  MSK:  Non tender with full range of motion and good stability and symmetric strength and tone of shoulders,  wrist, hip, knee and ankles bilaterally.  Mild arthritic changes of multiple joints Right elbow shows the patient does have significant swelling around the olecranon.  Mild redness noted.  Warm to touch.  No sign of any injury but patient does have a scab in the area.  Lacks the last 2 degrees of extension with full flexion.  Full supination and pronation.  Neurovascular intact distally.  Procedure: Real-time Ultrasound Guided Injection and aspiration of a regular olecranon bursa Device: GE Logiq Q7 Ultrasound guided injection is preferred based studies that show increased duration, increased effect, greater accuracy, decreased procedural pain, increased response rate, and decreased cost with ultrasound guided versus blind injection.  Verbal informed consent obtained.  Time-out conducted.  Noted no overlying erythema, induration, or other signs of local  infection.  Skin prepped in a sterile fashion.  Local anesthesia: Topical Ethyl chloride.  With sterile technique and under real time ultrasound guidance: With an 18-gauge 1-1/2 inch needle patient was injected with 1 cc of 0.5% Marcaine and aspirated 20 cc of straw-colored fluid and injected 1 cc of Kenalog 40 mg/mL Completed without difficulty  Pain immediately resolved suggesting accurate placement of the medication.  Advised to call if fevers/chills, erythema, induration, drainage, or persistent bleeding.  Images permanently stored and available for review in the ultrasound unit.  Impression: Technically successful  ultrasound guided injection.    Impression and Recommendations:     This case required medical decision making of moderate complexity. The above documentation has been reviewed and is accurate and complete Lyndal Pulley, DO       Note: This dictation was prepared with Dragon dictation along with smaller phrase technology. Any transcriptional errors that result from this process are unintentional.

## 2019-03-28 NOTE — Assessment & Plan Note (Signed)
Patient had aspiration done today.  Discussed compression.  Send fluid to the lab.  And cover with doxycycline secondary to mild hazy appearance of the fluid.  Patient will then follow-up with me again in 10 days to make sure he does not appear.  Patient knows if worsening symptoms to call us immediately.  Nothing appeared to be more of a synovitis.  No true signs of any cellulitis.

## 2019-03-29 LAB — SYNOVIAL CELL COUNT + DIFF, W/ CRYSTALS
Basophils, %: 0 %
Eosinophils-Synovial: 0 % (ref 0–2)
Lymphocytes-Synovial Fld: 22 % (ref 0–74)
Monocyte/Macrophage: 0 % (ref 0–69)
Neutrophil, Synovial: 78 % — ABNORMAL HIGH (ref 0–24)
Synoviocytes, %: 0 % (ref 0–15)
WBC, Synovial: 2211 cells/uL — ABNORMAL HIGH (ref <150)

## 2019-03-29 LAB — TIQ-NTM

## 2019-04-06 ENCOUNTER — Telehealth: Payer: Self-pay

## 2019-04-06 NOTE — Telephone Encounter (Signed)
Patient was returning call about elbow. States he is doing well but has a knot where Dr. Tamala Julian injected his elbow.

## 2019-04-07 ENCOUNTER — Ambulatory Visit: Payer: Medicare Other | Admitting: Internal Medicine

## 2019-05-11 ENCOUNTER — Ambulatory Visit: Payer: Medicare Other | Admitting: Family Medicine

## 2019-06-02 ENCOUNTER — Other Ambulatory Visit: Payer: Self-pay | Admitting: Internal Medicine

## 2019-06-02 NOTE — Telephone Encounter (Signed)
Control database checked last refill: 05/02/2019 30 tabs LOV:06/18/2018 NOV: none

## 2019-06-02 NOTE — Telephone Encounter (Signed)
Needs visit for refill

## 2019-06-07 ENCOUNTER — Encounter: Payer: Self-pay | Admitting: Internal Medicine

## 2019-06-07 ENCOUNTER — Ambulatory Visit (INDEPENDENT_AMBULATORY_CARE_PROVIDER_SITE_OTHER): Payer: Medicare Other | Admitting: Internal Medicine

## 2019-06-07 ENCOUNTER — Other Ambulatory Visit: Payer: Self-pay

## 2019-06-07 ENCOUNTER — Other Ambulatory Visit (INDEPENDENT_AMBULATORY_CARE_PROVIDER_SITE_OTHER): Payer: Medicare Other

## 2019-06-07 VITALS — BP 130/70 | HR 75 | Temp 98.0°F | Ht 66.0 in | Wt 172.0 lb

## 2019-06-07 DIAGNOSIS — R972 Elevated prostate specific antigen [PSA]: Secondary | ICD-10-CM

## 2019-06-07 DIAGNOSIS — R7309 Other abnormal glucose: Secondary | ICD-10-CM | POA: Diagnosis not present

## 2019-06-07 DIAGNOSIS — M1990 Unspecified osteoarthritis, unspecified site: Secondary | ICD-10-CM | POA: Diagnosis not present

## 2019-06-07 DIAGNOSIS — E782 Mixed hyperlipidemia: Secondary | ICD-10-CM

## 2019-06-07 DIAGNOSIS — Z23 Encounter for immunization: Secondary | ICD-10-CM | POA: Diagnosis not present

## 2019-06-07 LAB — COMPREHENSIVE METABOLIC PANEL
ALT: 18 U/L (ref 0–53)
AST: 20 U/L (ref 0–37)
Albumin: 4.5 g/dL (ref 3.5–5.2)
Alkaline Phosphatase: 64 U/L (ref 39–117)
BUN: 18 mg/dL (ref 6–23)
CO2: 26 mEq/L (ref 19–32)
Calcium: 10 mg/dL (ref 8.4–10.5)
Chloride: 103 mEq/L (ref 96–112)
Creatinine, Ser: 0.71 mg/dL (ref 0.40–1.50)
GFR: 106.99 mL/min (ref >60.00)
Glucose, Bld: 119 mg/dL — ABNORMAL HIGH (ref 70–99)
Potassium: 4.1 mEq/L (ref 3.5–5.1)
Sodium: 138 mEq/L (ref 135–145)
Total Bilirubin: 0.6 mg/dL (ref 0.2–1.2)
Total Protein: 6.9 g/dL (ref 6.0–8.3)

## 2019-06-07 LAB — CBC
HCT: 43.9 % (ref 39.0–52.0)
Hemoglobin: 14.9 g/dL (ref 13.0–17.0)
MCHC: 33.8 g/dL (ref 30.0–36.0)
MCV: 96.7 fl (ref 78.0–100.0)
Platelets: 269 10*3/uL (ref 150.0–400.0)
RBC: 4.54 Mil/uL (ref 4.22–5.81)
RDW: 12.9 % (ref 11.5–15.5)
WBC: 6.8 10*3/uL (ref 4.0–10.5)

## 2019-06-07 LAB — HEMOGLOBIN A1C: Hgb A1c MFr Bld: 6.4 % (ref 4.6–6.5)

## 2019-06-07 LAB — LDL CHOLESTEROL, DIRECT: Direct LDL: 124 mg/dL

## 2019-06-07 LAB — LIPID PANEL
Cholesterol: 184 mg/dL (ref 0–200)
HDL: 40.2 mg/dL (ref >39.00)
NonHDL: 143.56
Total CHOL/HDL Ratio: 5
Triglycerides: 225 mg/dL — ABNORMAL HIGH (ref 0.0–149.0)
VLDL: 45 mg/dL — ABNORMAL HIGH (ref 0.0–40.0)

## 2019-06-07 LAB — PSA: PSA: 4.29 ng/mL — ABNORMAL HIGH (ref 0.10–4.00)

## 2019-06-07 MED ORDER — TRAMADOL HCL 50 MG PO TABS: 50.0000 mg | ORAL_TABLET | Freq: Two times a day (BID) | ORAL | 5 refills | Status: DC | PRN

## 2019-06-07 NOTE — Assessment & Plan Note (Signed)
[place] narcotic database reviewed and updated and refilled tramadol 1 pill daily. Taking meloxicam 1 pill daily also.

## 2019-06-07 NOTE — Assessment & Plan Note (Signed)
Checking PSA and adjust as needed. On flomax.

## 2019-06-07 NOTE — Assessment & Plan Note (Signed)
Checking HgA1c and adjust as needed.  

## 2019-06-07 NOTE — Patient Instructions (Addendum)
You can try a carpal tunnel brace for the right hand to see if this helps.   The spot on the back is a skin tag and will never be cancerous.

## 2019-06-07 NOTE — Assessment & Plan Note (Signed)
Checking lipid panel and adjust as needed.  

## 2019-06-07 NOTE — Progress Notes (Signed)
   Subjective:   Patient ID: Andrew Foster, male    DOB: [birth year], 78 y.o.   MRN: [id number]  HPI The patient is a 78 YO man coming in for follow up chronic arthritis pain (takes tramadol 1 pill daily and meloxicam daily, denies change, seen orthopedics many times, denies injury) and elevated PSA (some BPH symptoms, takes flomax, still satisfied with control, 1-2 night time awakenings, denies loss of weight) and impaired sugars (pre-diabetes for some years, stable diet, denies change in exercise, denies numbness in feet or increased thirst).   Review of Systems  Constitutional: Negative.   HENT: Negative.   Eyes: Negative.   Respiratory: Negative for cough, chest tightness and shortness of breath.   Cardiovascular: Negative for chest pain, palpitations and leg swelling.  Gastrointestinal: Negative for abdominal distention, abdominal pain, constipation, diarrhea, nausea and vomiting.  Musculoskeletal: Negative.   Skin: Negative.   Neurological: Negative.   Psychiatric/Behavioral: Negative.     Objective:  Physical Exam Constitutional:      Appearance: He is well-developed.  HENT:     Head: Normocephalic and atraumatic.  Neck:     Musculoskeletal: Normal range of motion.  Cardiovascular:     Rate and Rhythm: Normal rate and regular rhythm.  Pulmonary:     Effort: Pulmonary effort is normal. No respiratory distress.     Breath sounds: Normal breath sounds. No wheezing or rales.  Abdominal:     General: Bowel sounds are normal. There is no distension.     Palpations: Abdomen is soft.     Tenderness: There is no abdominal tenderness. There is no rebound.  Skin:    General: Skin is warm and dry.     Comments: Skin tag on back  Neurological:     Mental Status: He is alert and oriented to person, place, and time.     Coordination: Coordination normal.     Vitals:   06/07/19 0904  BP: 130/70  Pulse: 75  Temp: 98 F (36.7 C)  TempSrc: Oral  SpO2: 94%  Weight: 172 lb (78  kg)  Height: 5\' 6"  (1.676 m)    Assessment & Plan:  Flu shot given at visit

## 2019-07-04 ENCOUNTER — Other Ambulatory Visit: Payer: Self-pay | Admitting: Internal Medicine

## 2019-07-16 ENCOUNTER — Other Ambulatory Visit: Payer: Self-pay | Admitting: Internal Medicine

## 2019-07-18 ENCOUNTER — Other Ambulatory Visit: Payer: Self-pay | Admitting: Internal Medicine

## 2019-10-04 ENCOUNTER — Other Ambulatory Visit: Payer: Self-pay | Admitting: Internal Medicine

## 2019-10-17 ENCOUNTER — Other Ambulatory Visit: Payer: Self-pay | Admitting: Internal Medicine

## 2019-12-08 ENCOUNTER — Other Ambulatory Visit: Payer: Self-pay | Admitting: Internal Medicine

## 2019-12-09 NOTE — Telephone Encounter (Signed)
Check [place] registry last filled 11/04/2019../l,mb

## 2019-12-19 ENCOUNTER — Other Ambulatory Visit: Payer: Self-pay

## 2019-12-19 ENCOUNTER — Ambulatory Visit (INDEPENDENT_AMBULATORY_CARE_PROVIDER_SITE_OTHER): Payer: Medicare Other

## 2019-12-19 VITALS — BP 120/70 | HR 73 | Temp 98.4°F | Resp 16 | Ht 66.0 in | Wt 173.2 lb

## 2019-12-19 DIAGNOSIS — Z0000 Encounter for general adult medical examination without abnormal findings: Secondary | ICD-10-CM

## 2019-12-19 NOTE — Patient Instructions (Addendum)
Andrew Foster , Thank you for taking time to come for your Medicare Wellness Visit. I appreciate your ongoing commitment to your health goals. Please review the following plan we discussed and let me know if I can assist you in the future.   Screening recommendations/referrals: Colorectal Screening: last done 02/18/2017  Vision and Dental Exams: Recommended annual ophthalmology exams for early detection of glaucoma and other disorders of the eye Recommended annual dental exams for proper oral hygiene  Vaccinations: Influenza vaccine: 06/07/2019 Pneumococcal vaccine: completed Tdap vaccine: 04/22/2012; Due every 10 years. Please call your insurance company to determine your out of pocket expense. You may also receive this vaccine at your local pharmacy or Health Dept. Shingles vaccine: Please call your insurance company to determine your out of pocket expense for the Shingrix vaccine. You may receive this vaccine at your local pharmacy.  Advanced directives: Advance directives discussed with you today. Please bring a copy of your POA (Power of Rose) and/or Living Will to your next appointment.  Goals:  Recommend to drink at least 6-8 8oz glasses of water per day.  Recommend to exercise for at least 150 minutes per week.  Recommend to remove any items from the home that may cause slips or trips.  Recommend to decrease portion sizes by eating 3 small healthy meals and at least 2 healthy snacks per day.  Recommend to begin DASH diet as directed below  Recommend to continue efforts to reduce smoking habits until no longer smoking. Smoking Cessation literature is attached below.  Next appointment: Please schedule your Annual Wellness Visit with your Nurse Health Advisor in one year.  Preventive Care 80 Years and Older, Male Preventive care refers to lifestyle choices and visits with your health care provider that can promote health and wellness. What does preventive care include?  A  yearly physical exam. This is also called an annual well check.  Dental exams once or twice a year.  Routine eye exams. Ask your health care provider how often you should have your eyes checked.  Personal lifestyle choices, including:  Daily care of your teeth and gums.  Regular physical activity.  Eating a healthy diet.  Avoiding tobacco and drug use.  Limiting alcohol use.  Practicing safe sex.  Taking low doses of aspirin every day if recommended by your health care provider..  Taking vitamin and mineral supplements as recommended by your health care provider. What happens during an annual well check? The services and screenings done by your health care provider during your annual well check will depend on your age, overall health, lifestyle risk factors, and family history of disease. Counseling  Your health care provider may ask you questions about your:  Alcohol use.  Tobacco use.  Drug use.  Emotional well-being.  Home and relationship well-being.  Sexual activity.  Eating habits.  History of falls.  Memory and ability to understand (cognition).  Work and work Statistician. Screening  You may have the following tests or measurements:  Height, weight, and BMI.  Blood pressure.  Lipid and cholesterol levels. These may be checked every 5 years, or more frequently if you are over 80 years old.  Skin check.  Lung cancer screening. You may have this screening every year starting at age 40 if you have a 30-pack-year history of smoking and currently smoke or have quit within the past 15 years.  Fecal occult blood test (FOBT) of the stool. You may have this test every year starting at age 42.  Flexible sigmoidoscopy or colonoscopy. You may have a sigmoidoscopy every 5 years or a colonoscopy every 10 years starting at age 80.  Prostate cancer screening. Recommendations will vary depending on your family history and other risks.  Hepatitis C blood  test.  Hepatitis B blood test.  Sexually transmitted disease (STD) testing.  Diabetes screening. This is done by checking your blood sugar (glucose) after you have not eaten for a while (fasting). You may have this done every 1-3 years.  Abdominal aortic aneurysm (AAA) screening. You may need this if you are a current or former smoker.  Osteoporosis. You may be screened starting at age 62 if you are at high risk. Talk with your health care provider about your test results, treatment options, and if necessary, the need for more tests. Vaccines  Your health care provider may recommend certain vaccines, such as:  Influenza vaccine. This is recommended every year.  Tetanus, diphtheria, and acellular pertussis (Tdap, Td) vaccine. You may need a Td booster every 10 years.  Zoster vaccine. You may need this after age 22.  Pneumococcal 13-valent conjugate (PCV13) vaccine. One dose is recommended after age 80.  Pneumococcal polysaccharide (PPSV23) vaccine. One dose is recommended after age 80. Talk to your health care provider about which screenings and vaccines you need and how often you need them. This information is not intended to replace advice given to you by your health care provider. Make sure you discuss any questions you have with your health care provider. Document Released: 09/14/2015 Document Revised: 05/07/2016 Document Reviewed: 06/19/2015 Elsevier Interactive Patient Education  2017 [employer] Prevention in the Home Falls can cause injuries. They can happen to people of all ages. There are many things you can do to make your home safe and to help prevent falls. What can I do on the outside of my home?  Regularly fix the edges of walkways and driveways and fix any cracks.  Remove anything that might make you trip as you walk through a door, such as a raised step or threshold.  Trim any bushes or trees on the path to your home.  Use bright outdoor  lighting.  Clear any walking paths of anything that might make someone trip, such as rocks or tools.  Regularly check to see if handrails are loose or broken. Make sure that both sides of any steps have handrails.  Any raised decks and porches should have guardrails on the edges.  Have any leaves, snow, or ice cleared regularly.  Use sand or salt on walking paths during winter.  Clean up any spills in your garage right away. This includes oil or grease spills. What can I do in the bathroom?  Use night lights.  Install grab bars by the toilet and in the tub and shower. Do not use towel bars as grab bars.  Use non-skid mats or decals in the tub or shower.  If you need to sit down in the shower, use a plastic, non-slip stool.  Keep the floor dry. Clean up any water that spills on the floor as soon as it happens.  Remove soap buildup in the tub or shower regularly.  Attach bath mats securely with double-sided non-slip rug tape.  Do not have throw rugs and other things on the floor that can make you trip. What can I do in the bedroom?  Use night lights.  Make sure that you have a light by your bed that is easy to reach.  Do  not use any sheets or blankets that are too big for your bed. They should not hang down onto the floor.  Have a firm chair that has side arms. You can use this for support while you get dressed.  Do not have throw rugs and other things on the floor that can make you trip. What can I do in the kitchen?  Clean up any spills right away.  Avoid walking on wet floors.  Keep items that you use a lot in easy-to-reach places.  If you need to reach something above you, use a strong step stool that has a grab bar.  Keep electrical cords out of the way.  Do not use floor polish or wax that makes floors slippery. If you must use wax, use non-skid floor wax.  Do not have throw rugs and other things on the floor that can make you trip. What can I do with my  stairs?  Do not leave any items on the stairs.  Make sure that there are handrails on both sides of the stairs and use them. Fix handrails that are broken or loose. Make sure that handrails are as long as the stairways.  Check any carpeting to make sure that it is firmly attached to the stairs. Fix any carpet that is loose or worn.  Avoid having throw rugs at the top or bottom of the stairs. If you do have throw rugs, attach them to the floor with carpet tape.  Make sure that you have a light switch at the top of the stairs and the bottom of the stairs. If you do not have them, ask someone to add them for you. What else can I do to help prevent falls?  Wear shoes that:  Do not have high heels.  Have rubber bottoms.  Are comfortable and fit you well.  Are closed at the toe. Do not wear sandals.  If you use a stepladder:  Make sure that it is fully opened. Do not climb a closed stepladder.  Make sure that both sides of the stepladder are locked into place.  Ask someone to hold it for you, if possible.  Clearly mark and make sure that you can see:  Any grab bars or handrails.  First and last steps.  Where the edge of each step is.  Use tools that help you move around (mobility aids) if they are needed. These include:  Canes.  Walkers.  Scooters.  Crutches.  Turn on the lights when you go into a dark area. Replace any light bulbs as soon as they burn out.  Set up your furniture so you have a clear path. Avoid moving your furniture around.  If any of your floors are uneven, fix them.  If there are any pets around you, be aware of where they are.  Review your medicines with your doctor. Some medicines can make you feel dizzy. This can increase your chance of falling. Ask your doctor what other things that you can do to help prevent falls. This information is not intended to replace advice given to you by your health care provider. Make sure you discuss any  questions you have with your health care provider. Document Released: 06/14/2009 Document Revised: 01/24/2016 Document Reviewed: 09/22/2014 Elsevier Interactive Patient Education  2017 [employer].

## 2019-12-19 NOTE — Progress Notes (Signed)
Subjective:   Andrew Foster is a 79 y.o. male who presents for Medicare Annual/Subsequent preventive examination.  Review of Systems:  No ROS Medicare Wellness Visit Cardiac Risk Factors include: advanced age (>23men, >20 women);dyslipidemia;male gender Sleep Patterns: No issues with falling sleep. Home Safety/Smoke Alarms: Feels safe in home; Smoke alarms in place. Living environment: 1-story home; Lives alone with 6 dogs  Seat Belt Safety/Bike Helmet: Wears seat belt.     Objective:    Vitals: BP 120/70 (BP Location: Left Arm, Patient Position: Sitting, Cuff Size: Normal)   Pulse 73   Temp 98.4 F (36.9 C)   Resp 16   Ht 5\' 6"  (1.676 m)   Wt 173 lb 3.2 oz (78.6 kg)   SpO2 95%   BMI 27.96 kg/m   Body mass index is 27.96 kg/m.  Advanced Directives 12/19/2019 12/17/2018 12/15/2017 02/04/2017 12/10/2016 11/02/2016 11/01/2016  Does Patient Have a Medical Advance Directive? Yes No Yes No No No No  Type of Paramedic of [place];Living will - [place];Living will - - - -  Does patient want to make changes to medical advance directive? No - Patient declined Yes (ED - Information included in AVS) - - Yes (ED - Information included in AVS) - -  Copy of [place] in Chart? No - copy requested - No - copy requested - - - -  Would patient like information on creating a medical advance directive? - - - - - No - Patient declined -    Tobacco Social History   Tobacco Use  Smoking Status Former Smoker  . Quit date: 09/01/1974  . Years since quitting: 45.3  Smokeless Tobacco Never Used  Tobacco Comment   smoked 1956-1976, up to 3 ppd     Counseling given: No Comment: smoked 1956-1976, up to 3 ppd   Clinical Intake:  Pre-visit preparation completed: Yes  Pain : No/denies pain Pain Score: 0-No pain     BMI - recorded: 28 Nutritional Status: BMI 25 -29 Overweight Nutritional Risks: None Diabetes: No  How often do  you need to have someone help you when you read instructions, pamphlets, or other written materials from your doctor or pharmacy?: 1 - Never  Interpreter Needed?: No  Information entered by :: Deseri Loss N. Lowell Guitar, LPN  Past Medical History:  Diagnosis Date  . Acute pyelonephritis 11/01/2016  . Adenomatous polyps of colon 01/2017   no f/u - age  . BPH (benign prostatic hyperplasia)   . Diverticulosis   . Hypertension    Past Surgical History:  Procedure Laterality Date  . CARPAL TUNNEL RELEASE    . COLONOSCOPY     Tics; [facility]  . HERNIA REPAIR    . intra articular steroids     R shoulder  . PROSTATE BIOPSY     Dr Janice Norrie  . VASECTOMY     Family History  Problem Relation Age of Onset  . Heart attack Mother 71  . Diabetes Mother   . Diabetes Sister        TWO sisters  . Diabetes Brother        TWO brothers  . Diabetes type I Brother   . Stroke Neg Hx   . Cancer Neg Hx   . Colon cancer Neg Hx    Social History   Socioeconomic History  . Marital status: Widowed    Spouse name: Not on file  . Number of children: 4  .  Years of education: Not on file  . Highest education level: Not on file  Occupational History  . Not on file  Tobacco Use  . Smoking status: Former Smoker    Quit date: 09/01/1974    Years since quitting: 45.3  . Smokeless tobacco: Never Used  . Tobacco comment: smoked 1956-1976, up to 3 ppd  Substance and Sexual Activity  . Alcohol use: No  . Drug use: No  . Sexual activity: Not Currently  Other Topics Concern  . Not on file  Social History Narrative  . Not on file   Social Determinants of Health   Financial Resource Strain:   . Difficulty of Paying Living Expenses:   Food Insecurity:   . Worried About Charity fundraiser in the Last Year:   . Arboriculturist in the Last Year:   Transportation Needs:   . Film/video editor (Medical):   Marland Kitchen Lack of Transportation (Non-Medical):   Physical Activity:   . Days of Exercise per Week:   .  Minutes of Exercise per Session:   Stress:   . Feeling of Stress :   Social Connections:   . Frequency of Communication with Friends and Family:   . Frequency of Social Gatherings with Friends and Family:   . Attends Religious Services:   . Active Member of Clubs or Organizations:   . Attends Archivist Meetings:   Marland Kitchen Marital Status:     Outpatient Encounter Medications as of 12/19/2019  Medication Sig  . traMADol (ULTRAM) 50 MG tablet TAKE 1 TABLET BY MOUTH EVERY 12 HOURS AS NEEDED FOR 30 DAYS  . aspirin 81 MG tablet Take 81 mg by mouth daily.  . meclizine (ANTIVERT) 12.5 MG tablet TAKE 1 TABLET BY MOUTH THREE TIMES DAILY AS NEEDED FOR  DIZZINESS  . meloxicam (MOBIC) 15 MG tablet Take 1 tablet by mouth once daily  . Multiple Vitamin (MULTIVITAMIN) tablet Take 1 tablet by mouth daily.   . Omega-3 Fatty Acids (FISH OIL PO) Take 1 capsule by mouth daily.  Marland Kitchen omeprazole (PRILOSEC OTC) 20 MG tablet Take 20 mg by mouth daily.   . tamsulosin (FLOMAX) 0.4 MG CAPS capsule Take 1 capsule by mouth at bedtime  . Vitamin D, Cholecalciferol, 1000 units CAPS Take by mouth.   No facility-administered encounter medications on file as of 12/19/2019.    Activities of Daily Living In your present state of health, do you have any difficulty performing the following activities: 12/19/2019  Hearing? N  Vision? N  Difficulty concentrating or making decisions? N  Walking or climbing stairs? N  Dressing or bathing? N  Doing errands, shopping? N  Preparing Food and eating ? N  Using the Toilet? N  In the past six months, have you accidently leaked urine? Y  Do you have problems with loss of bowel control? N  Managing your Medications? N  Managing your Finances? N  Housekeeping or managing your Housekeeping? N  Some recent data might be hidden    Patient Care Team: Hoyt Koch, MD as PCP - General (Internal Medicine)   Assessment:   This is a routine wellness examination for  Andrew Foster.  Exercise Activities and Dietary recommendations Current Exercise Habits: The patient does not participate in regular exercise at present(stay active by working in yard and working on classic cars), Exercise limited by: None identified  Goals    . lose weight to feel healthy     Start to walk daily,  eat healthy, and decrease portion size.     . Patient Stated     Stay as healthy and as independent as possible and keep riding my [name].    . Patient Stated     Stay physically active, eat healthy, and enjoy my family and dogs.       Fall Risk Fall Risk  12/19/2019 06/07/2019 12/17/2018 12/15/2017 12/10/2016  Falls in the past year? 0 0 1 No No  Comment - - - - -  Number falls in past yr: 0 - 0 - -  Injury with Fall? 0 - - - -  Risk for fall due to : No Fall Risks - - - -  Follow up Falls evaluation completed;Education provided;Falls prevention discussed - Falls prevention discussed;Education provided - -   Is the patient's home free of loose throw rugs in walkways, pet beds, electrical cords, etc?   yes      Grab bars in the bathroom? yes      Handrails on the stairs?   yes      Adequate lighting?   yes  Depression Screen PHQ 2/9 Scores 12/19/2019 06/07/2019 12/17/2018 12/15/2017  PHQ - 2 Score 0 0 0 1  PHQ- 9 Score - - - 2    Cognitive Function MMSE - Mini Mental State Exam 12/15/2017  Orientation to time 5  Orientation to Place 5  Registration 3  Attention/ Calculation 5  Recall 1  Language- name 2 objects 2  Language- repeat 1  Language- follow 3 step command 3  Language- read & follow direction 1  Write a sentence 1  Copy design 1  Total score 28     6CIT Screen 12/19/2019  What Year? 0 points  What month? 0 points  What time? 0 points  Count back from 20 0 points  Months in reverse 0 points  Repeat phrase 0 points  Total Score 0    Immunization History  Administered Date(s) Administered  . Fluad Quad(high Dose 65+) 06/07/2019  . Influenza  Whole 07/13/2007  . Influenza, High Dose Seasonal PF 06/09/2017  . Influenza,inj,Quad PF,6+ Mos 05/01/2016  . Influenza-Unspecified 10/03/2015, 08/10/2018  . Pneumococcal Conjugate-13 12/10/2016  . Pneumococcal Polysaccharide-23 10/03/2015  . Td 04/22/2012    Qualifies for Shingles Vaccine? Yes  Screening Tests Health Maintenance  Topic Date Due  . COVID-19 Vaccine (1) Never done  . INFLUENZA VACCINE  04/01/2020  . TETANUS/TDAP  04/22/2022  . PNA vac Low Risk Adult  Completed   Cancer Screenings: Lung: Low Dose CT Chest recommended if Age 62-80 years, 30 pack-year currently smoking OR have quit w/in 15years. Patient does qualify. Colorectal: Yes       Plan:     Reviewed health maintenance screenings with patient today and relevant education, vaccines, and/or referrals were provided.    Continue doing brain stimulating activities (puzzles, reading, adult coloring books, staying active) to keep memory sharp.    Continue to eat heart healthy diet (full of fruits, vegetables, whole grains, lean protein, water--limit salt, fat, and sugar intake) and increase physical activity as tolerated.  I have personally reviewed and noted the following in the patient's chart:   . Medical and social history . Use of alcohol, tobacco or illicit drugs  . Current medications and supplements . Functional ability and status . Nutritional status . Physical activity . Advanced directives . List of other physicians . Hospitalizations, surgeries, and ER visits in previous 12 months . Vitals . Screenings to include  cognitive, depression, and falls . Referrals and appointments  In addition, I have reviewed and discussed with patient certain preventive protocols, quality metrics, and best practice recommendations. A written personalized care plan for preventive services as well as general preventive health recommendations were provided to patient.     Sheral Flow, LPN  [id number] Nurse  Health Advisor

## 2020-01-09 ENCOUNTER — Other Ambulatory Visit: Payer: Self-pay | Admitting: Internal Medicine

## 2020-02-06 DIAGNOSIS — R972 Elevated prostate specific antigen [PSA]: Secondary | ICD-10-CM | POA: Diagnosis not present

## 2020-03-15 DIAGNOSIS — M7071 Other bursitis of hip, right hip: Secondary | ICD-10-CM | POA: Diagnosis not present

## 2020-03-20 DIAGNOSIS — M7071 Other bursitis of hip, right hip: Secondary | ICD-10-CM | POA: Diagnosis not present

## 2020-03-22 DIAGNOSIS — M7071 Other bursitis of hip, right hip: Secondary | ICD-10-CM | POA: Diagnosis not present

## 2020-03-27 DIAGNOSIS — M7071 Other bursitis of hip, right hip: Secondary | ICD-10-CM | POA: Diagnosis not present

## 2020-03-29 DIAGNOSIS — M7071 Other bursitis of hip, right hip: Secondary | ICD-10-CM | POA: Diagnosis not present

## 2020-04-03 DIAGNOSIS — M7071 Other bursitis of hip, right hip: Secondary | ICD-10-CM | POA: Diagnosis not present

## 2020-04-05 DIAGNOSIS — M7071 Other bursitis of hip, right hip: Secondary | ICD-10-CM | POA: Diagnosis not present

## 2020-04-06 ENCOUNTER — Other Ambulatory Visit: Payer: Self-pay | Admitting: Internal Medicine

## 2020-06-01 ENCOUNTER — Other Ambulatory Visit: Payer: Self-pay | Admitting: Internal Medicine

## 2020-06-07 ENCOUNTER — Ambulatory Visit: Payer: Medicare Other | Admitting: Internal Medicine

## 2020-07-05 ENCOUNTER — Other Ambulatory Visit: Payer: Self-pay | Admitting: Internal Medicine

## 2020-07-19 ENCOUNTER — Ambulatory Visit (INDEPENDENT_AMBULATORY_CARE_PROVIDER_SITE_OTHER): Payer: Medicare Other | Admitting: Internal Medicine

## 2020-07-19 ENCOUNTER — Encounter: Payer: Self-pay | Admitting: Internal Medicine

## 2020-07-19 ENCOUNTER — Other Ambulatory Visit: Payer: Self-pay

## 2020-07-19 VITALS — BP 132/64 | HR 71 | Temp 98.6°F | Ht 66.0 in | Wt 173.0 lb

## 2020-07-19 DIAGNOSIS — E782 Mixed hyperlipidemia: Secondary | ICD-10-CM

## 2020-07-19 DIAGNOSIS — Z23 Encounter for immunization: Secondary | ICD-10-CM

## 2020-07-19 DIAGNOSIS — R7309 Other abnormal glucose: Secondary | ICD-10-CM

## 2020-07-19 DIAGNOSIS — R972 Elevated prostate specific antigen [PSA]: Secondary | ICD-10-CM | POA: Diagnosis not present

## 2020-07-19 LAB — COMPREHENSIVE METABOLIC PANEL
ALT: 14 U/L (ref 0–53)
AST: 19 U/L (ref 0–37)
Albumin: 4.2 g/dL (ref 3.5–5.2)
Alkaline Phosphatase: 50 U/L (ref 39–117)
BUN: 17 mg/dL (ref 6–23)
CO2: 30 mEq/L (ref 19–32)
Calcium: 9.4 mg/dL (ref 8.4–10.5)
Chloride: 103 mEq/L (ref 96–112)
Creatinine, Ser: 0.71 mg/dL (ref 0.40–1.50)
GFR: 86.83 mL/min (ref >60.00)
Glucose, Bld: 96 mg/dL (ref 70–99)
Potassium: 4.5 mEq/L (ref 3.5–5.1)
Sodium: 138 mEq/L (ref 135–145)
Total Bilirubin: 0.4 mg/dL (ref 0.2–1.2)
Total Protein: 6.6 g/dL (ref 6.0–8.3)

## 2020-07-19 LAB — LIPID PANEL
Cholesterol: 177 mg/dL (ref 0–200)
HDL: 41.9 mg/dL (ref >39.00)
NonHDL: 134.96
Total CHOL/HDL Ratio: 4
Triglycerides: 289 mg/dL — ABNORMAL HIGH (ref 0.0–149.0)
VLDL: 57.8 mg/dL — ABNORMAL HIGH (ref 0.0–40.0)

## 2020-07-19 LAB — CBC
HCT: 43.5 % (ref 39.0–52.0)
Hemoglobin: 14.9 g/dL (ref 13.0–17.0)
MCHC: 34.2 g/dL (ref 30.0–36.0)
MCV: 96.4 fl (ref 78.0–100.0)
Platelets: 219 10*3/uL (ref 150.0–400.0)
RBC: 4.51 Mil/uL (ref 4.22–5.81)
RDW: 12.4 % (ref 11.5–15.5)
WBC: 6.5 10*3/uL (ref 4.0–10.5)

## 2020-07-19 LAB — LDL CHOLESTEROL, DIRECT: Direct LDL: 116 mg/dL

## 2020-07-19 LAB — PSA: PSA: 4.41 ng/mL — ABNORMAL HIGH (ref 0.10–4.00)

## 2020-07-19 LAB — HEMOGLOBIN A1C: Hgb A1c MFr Bld: 6.2 % (ref 4.6–6.5)

## 2020-07-19 NOTE — Assessment & Plan Note (Addendum)
Checking PSA yearly. Can go back to urology if increasing. On flomax.

## 2020-07-19 NOTE — Assessment & Plan Note (Signed)
Checking lipid panel and adjust as needed.  

## 2020-07-19 NOTE — Progress Notes (Signed)
   Subjective:   Patient ID: Andrew Foster, male    DOB: February 09, 1941, 79 y.o.   MRN: [id number]  HPI The patient is an 79 YO man coming in for follow up high glucose levels (denies change in diet, stays active, denies weight change in last year, denies numbness or tingling in hands or feet) and elevated PSA level (monitoring yearly, denies worsening or new urinary symptoms such as incontinence, nocturia, frequency), and hyperlipidemia (controlled through diet, not on statin currently, takes aspirin daily).   Review of Systems  Constitutional: Negative.   HENT: Negative.   Eyes: Negative.   Respiratory: Negative for cough, chest tightness and shortness of breath.   Cardiovascular: Negative for chest pain, palpitations and leg swelling.  Gastrointestinal: Negative for abdominal distention, abdominal pain, constipation, diarrhea, nausea and vomiting.  Musculoskeletal: Negative.   Skin: Negative.   Neurological: Negative.   Psychiatric/Behavioral: Negative.     Objective:  Physical Exam Constitutional:      Appearance: He is well-developed.  HENT:     Head: Normocephalic and atraumatic.  Cardiovascular:     Rate and Rhythm: Normal rate and regular rhythm.  Pulmonary:     Effort: Pulmonary effort is normal. No respiratory distress.     Breath sounds: Normal breath sounds. No wheezing or rales.  Abdominal:     General: Bowel sounds are normal. There is no distension.     Palpations: Abdomen is soft.     Tenderness: There is no abdominal tenderness. There is no rebound.  Musculoskeletal:     Cervical back: Normal range of motion.  Skin:    General: Skin is warm and dry.  Neurological:     Mental Status: He is alert and oriented to person, place, and time.     Coordination: Coordination normal.     Vitals:   07/19/20 1056  BP: 132/64  Pulse: 71  Temp: 98.6 F (37 C)  TempSrc: Oral  SpO2: 94%  Weight: 173 lb (78.5 kg)  Height: 5\' 6"  (1.676 m)    This visit occurred  during the SARS-CoV-2 public health emergency.  Safety protocols were in place, including screening questions prior to the visit, additional usage of staff PPE, and extensive cleaning of exam room while observing appropriate contact time as indicated for disinfecting solutions.   Assessment & Plan:  Flu shot given at visit

## 2020-07-19 NOTE — Patient Instructions (Signed)
We will check the labs today. 

## 2020-07-19 NOTE — Assessment & Plan Note (Signed)
Checking HgA1c, strong family history of diabetes.

## 2020-07-31 DIAGNOSIS — Z23 Encounter for immunization: Secondary | ICD-10-CM | POA: Diagnosis not present

## 2020-08-03 ENCOUNTER — Encounter: Payer: Self-pay | Admitting: Internal Medicine

## 2020-08-03 ENCOUNTER — Other Ambulatory Visit: Payer: Self-pay | Admitting: Internal Medicine

## 2020-08-03 NOTE — Telephone Encounter (Signed)
Check [place] registry last filled 07/06/2020.Marland KitchenJohny Foster

## 2020-10-08 ENCOUNTER — Other Ambulatory Visit: Payer: Self-pay | Admitting: Internal Medicine

## 2020-10-15 ENCOUNTER — Other Ambulatory Visit: Payer: Self-pay

## 2020-10-16 ENCOUNTER — Ambulatory Visit (INDEPENDENT_AMBULATORY_CARE_PROVIDER_SITE_OTHER): Payer: Medicare Other | Admitting: Internal Medicine

## 2020-10-16 ENCOUNTER — Other Ambulatory Visit: Payer: Self-pay

## 2020-10-16 ENCOUNTER — Encounter: Payer: Self-pay | Admitting: Internal Medicine

## 2020-10-16 DIAGNOSIS — H8110 Benign paroxysmal vertigo, unspecified ear: Secondary | ICD-10-CM | POA: Diagnosis not present

## 2020-10-16 MED ORDER — MELOXICAM 15 MG PO TABS: 15.0000 mg | ORAL_TABLET | Freq: Every day | ORAL | 3 refills | Status: DC

## 2020-10-16 MED ORDER — MECLIZINE HCL 12.5 MG PO TABS: 12.5000 mg | ORAL_TABLET | Freq: Three times a day (TID) | ORAL | 1 refills | Status: AC | PRN

## 2020-10-16 MED ORDER — TAMSULOSIN HCL 0.4 MG PO CAPS: 0.4000 mg | ORAL_CAPSULE | Freq: Every evening | ORAL | 3 refills | Status: DC

## 2020-10-16 NOTE — Patient Instructions (Signed)
We have sent in the refills.  

## 2020-10-16 NOTE — Assessment & Plan Note (Signed)
No flare today but requesting refill due to his bottle expired. Has not had problems since giving up processed meats and he will continue to avoid. His daughter has same and gave up same and has had much less problems.

## 2020-10-16 NOTE — Progress Notes (Signed)
   Subjective:   Patient ID: Andrew Foster, male    DOB: Jan 10, 1941, 80 y.o.   MRN: [id number]  HPI The patient is an 80 YO man coming in for refill of his meclizine. Has not had flare up recently and feels that coming off processed meats years ago has helped. Does still have meclizine in case needed but this is out of date. Denies current sinus problems.   Review of Systems  Constitutional: Negative.   HENT: Negative.   Eyes: Negative.   Respiratory: Negative for cough, chest tightness and shortness of breath.   Cardiovascular: Negative for chest pain, palpitations and leg swelling.  Gastrointestinal: Negative for abdominal distention, abdominal pain, constipation, diarrhea, nausea and vomiting.  Musculoskeletal: Negative.   Skin: Negative.   Neurological: Negative.   Psychiatric/Behavioral: Negative.     Objective:  Physical Exam Constitutional:      Appearance: He is well-developed and well-nourished.  HENT:     Head: Normocephalic and atraumatic.  Eyes:     Extraocular Movements: EOM normal.  Cardiovascular:     Rate and Rhythm: Normal rate and regular rhythm.  Pulmonary:     Effort: Pulmonary effort is normal. No respiratory distress.     Breath sounds: Normal breath sounds. No wheezing or rales.  Abdominal:     General: Bowel sounds are normal. There is no distension.     Palpations: Abdomen is soft.     Tenderness: There is no abdominal tenderness. There is no rebound.  Musculoskeletal:        General: No edema.     Cervical back: Normal range of motion.  Skin:    General: Skin is warm and dry.  Neurological:     Mental Status: He is alert and oriented to person, place, and time.     Coordination: Coordination normal.  Psychiatric:        Mood and Affect: Mood and affect normal.     Vitals:   10/16/20 1042  BP: (!) 154/88  Pulse: 62  Temp: 98.3 F (36.8 C)  TempSrc: Oral  SpO2: 98%  Weight: 180 lb (81.6 kg)  Height: 5\' 6"  (1.676 m)    This visit  occurred during the SARS-CoV-2 public health emergency.  Safety protocols were in place, including screening questions prior to the visit, additional usage of staff PPE, and extensive cleaning of exam room while observing appropriate contact time as indicated for disinfecting solutions.   Assessment & Plan:  Visit time 15 minutes in face to face communication with patient and coordination of care, additional 5 minutes spent in record review, coordination or care, ordering tests, communicating/referring to other healthcare professionals, documenting in medical records all on the same day of the visit for total time 20 minutes spent on the visit.

## 2020-12-18 ENCOUNTER — Ambulatory Visit (INDEPENDENT_AMBULATORY_CARE_PROVIDER_SITE_OTHER): Payer: Medicare Other

## 2020-12-18 ENCOUNTER — Other Ambulatory Visit: Payer: Self-pay

## 2020-12-18 VITALS — BP 122/80 | HR 70 | Temp 98.0°F | Resp 16 | Ht 66.0 in | Wt 173.2 lb

## 2020-12-18 DIAGNOSIS — Z0000 Encounter for general adult medical examination without abnormal findings: Secondary | ICD-10-CM

## 2020-12-18 NOTE — Patient Instructions (Signed)
Andrew Foster , Thank you for taking time to come for your Medicare Wellness Visit. I appreciate your ongoing commitment to your health goals. Please review the following plan we discussed and let me know if I can assist you in the future.   Screening recommendations/referrals: Colonoscopy: no repeat due to age Recommended yearly ophthalmology/optometry visit for glaucoma screening and checkup Recommended yearly dental visit for hygiene and checkup  Vaccinations: Influenza vaccine: 07/19/2020 Pneumococcal vaccine: 10/03/2015, 12/10/2016 Tdap vaccine: 04/22/2012; due every 10 years Shingles vaccine: never done; Please call your insurance company to determine your out of pocket expense for the Shingrix vaccine. You may receive this vaccine at your local pharmacy. Covid-19: 09/26/2019, 10/21/2019, 07/31/2020  Advanced directives: Please bring a copy of your health care power of attorney and living will to the office at your convenience.  Conditions/risks identified: Yes; Reviewed health maintenance screenings with patient today and relevant education, vaccines, and/or referrals were provided. Please continue to do your personal lifestyle choices by: daily care of teeth and gums, regular physical activity (goal should be 5 days a week for 30 minutes), eat a healthy diet, avoid tobacco and drug use, limiting any alcohol intake, taking a low-dose aspirin (if not allergic or have been advised by your provider otherwise) and taking vitamins and minerals as recommended by your provider. Continue doing brain stimulating activities (puzzles, reading, adult coloring books, staying active) to keep memory sharp. Continue to eat heart healthy diet (full of fruits, vegetables, whole grains, lean protein, water--limit salt, fat, and sugar intake) and increase physical activity as tolerated.  Next appointment: Please schedule your next Medicare Wellness Visit with your Nurse Health Advisor in 1 year by calling  [phone].  Preventive Care 81 Years and Older, Male Preventive care refers to lifestyle choices and visits with your health care provider that can promote health and wellness. What does preventive care include?  A yearly physical exam. This is also called an annual well check.  Dental exams once or twice a year.  Routine eye exams. Ask your health care provider how often you should have your eyes checked.  Personal lifestyle choices, including:  Daily care of your teeth and gums.  Regular physical activity.  Eating a healthy diet.  Avoiding tobacco and drug use.  Limiting alcohol use.  Practicing safe sex.  Taking low doses of aspirin every day.  Taking vitamin and mineral supplements as recommended by your health care provider. What happens during an annual well check? The services and screenings done by your health care provider during your annual well check will depend on your age, overall health, lifestyle risk factors, and family history of disease. Counseling  Your health care provider may ask you questions about your:  Alcohol use.  Tobacco use.  Drug use.  Emotional well-being.  Home and relationship well-being.  Sexual activity.  Eating habits.  History of falls.  Memory and ability to understand (cognition).  Work and work Statistician. Screening  You may have the following tests or measurements:  Height, weight, and BMI.  Blood pressure.  Lipid and cholesterol levels. These may be checked every 5 years, or more frequently if you are over 79 years old.  Skin check.  Lung cancer screening. You may have this screening every year starting at age 81 if you have a 30-pack-year history of smoking and currently smoke or have quit within the past 15 years.  Fecal occult blood test (FOBT) of the stool. You may have this test every year starting  at age 81.  Flexible sigmoidoscopy or colonoscopy. You may have a sigmoidoscopy every 5 years or a  colonoscopy every 10 years starting at age 81.  Prostate cancer screening. Recommendations will vary depending on your family history and other risks.  Hepatitis C blood test.  Hepatitis B blood test.  Sexually transmitted disease (STD) testing.  Diabetes screening. This is done by checking your blood sugar (glucose) after you have not eaten for a while (fasting). You may have this done every 1-3 years.  Abdominal aortic aneurysm (AAA) screening. You may need this if you are a current or former smoker.  Osteoporosis. You may be screened starting at age 28 if you are at high risk. Talk with your health care provider about your test results, treatment options, and if necessary, the need for more tests. Vaccines  Your health care provider may recommend certain vaccines, such as:  Influenza vaccine. This is recommended every year.  Tetanus, diphtheria, and acellular pertussis (Tdap, Td) vaccine. You may need a Td booster every 10 years.  Zoster vaccine. You may need this after age 81.  Pneumococcal 13-valent conjugate (PCV13) vaccine. One dose is recommended after age 81.  Pneumococcal polysaccharide (PPSV23) vaccine. One dose is recommended after age 81. Talk to your health care provider about which screenings and vaccines you need and how often you need them. This information is not intended to replace advice given to you by your health care provider. Make sure you discuss any questions you have with your health care provider. Document Released: 09/14/2015 Document Revised: 05/07/2016 Document Reviewed: 06/19/2015 Elsevier Interactive Patient Education  2017 [employer] Prevention in the Home Falls can cause injuries. They can happen to people of all ages. There are many things you can do to make your home safe and to help prevent falls. What can I do on the outside of my home?  Regularly fix the edges of walkways and driveways and fix any cracks.  Remove anything that  might make you trip as you walk through a door, such as a raised step or threshold.  Trim any bushes or trees on the path to your home.  Use bright outdoor lighting.  Clear any walking paths of anything that might make someone trip, such as rocks or tools.  Regularly check to see if handrails are loose or broken. Make sure that both sides of any steps have handrails.  Any raised decks and porches should have guardrails on the edges.  Have any leaves, snow, or ice cleared regularly.  Use sand or salt on walking paths during winter.  Clean up any spills in your garage right away. This includes oil or grease spills. What can I do in the bathroom?  Use night lights.  Install grab bars by the toilet and in the tub and shower. Do not use towel bars as grab bars.  Use non-skid mats or decals in the tub or shower.  If you need to sit down in the shower, use a plastic, non-slip stool.  Keep the floor dry. Clean up any water that spills on the floor as soon as it happens.  Remove soap buildup in the tub or shower regularly.  Attach bath mats securely with double-sided non-slip rug tape.  Do not have throw rugs and other things on the floor that can make you trip. What can I do in the bedroom?  Use night lights.  Make sure that you have a light by your bed that is easy  to reach.  Do not use any sheets or blankets that are too big for your bed. They should not hang down onto the floor.  Have a firm chair that has side arms. You can use this for support while you get dressed.  Do not have throw rugs and other things on the floor that can make you trip. What can I do in the kitchen?  Clean up any spills right away.  Avoid walking on wet floors.  Keep items that you use a lot in easy-to-reach places.  If you need to reach something above you, use a strong step stool that has a grab bar.  Keep electrical cords out of the way.  Do not use floor polish or wax that makes floors  slippery. If you must use wax, use non-skid floor wax.  Do not have throw rugs and other things on the floor that can make you trip. What can I do with my stairs?  Do not leave any items on the stairs.  Make sure that there are handrails on both sides of the stairs and use them. Fix handrails that are broken or loose. Make sure that handrails are as long as the stairways.  Check any carpeting to make sure that it is firmly attached to the stairs. Fix any carpet that is loose or worn.  Avoid having throw rugs at the top or bottom of the stairs. If you do have throw rugs, attach them to the floor with carpet tape.  Make sure that you have a light switch at the top of the stairs and the bottom of the stairs. If you do not have them, ask someone to add them for you. What else can I do to help prevent falls?  Wear shoes that:  Do not have high heels.  Have rubber bottoms.  Are comfortable and fit you well.  Are closed at the toe. Do not wear sandals.  If you use a stepladder:  Make sure that it is fully opened. Do not climb a closed stepladder.  Make sure that both sides of the stepladder are locked into place.  Ask someone to hold it for you, if possible.  Clearly mark and make sure that you can see:  Any grab bars or handrails.  First and last steps.  Where the edge of each step is.  Use tools that help you move around (mobility aids) if they are needed. These include:  Canes.  Walkers.  Scooters.  Crutches.  Turn on the lights when you go into a dark area. Replace any light bulbs as soon as they burn out.  Set up your furniture so you have a clear path. Avoid moving your furniture around.  If any of your floors are uneven, fix them.  If there are any pets around you, be aware of where they are.  Review your medicines with your doctor. Some medicines can make you feel dizzy. This can increase your chance of falling. Ask your doctor what other things that you  can do to help prevent falls. This information is not intended to replace advice given to you by your health care provider. Make sure you discuss any questions you have with your health care provider. Document Released: 06/14/2009 Document Revised: 01/24/2016 Document Reviewed: 09/22/2014 Elsevier Interactive Patient Education  2017 [employer].

## 2020-12-18 NOTE — Progress Notes (Signed)
Subjective:   Andrew Foster is a 80 y.o. male who presents for Medicare Annual/Subsequent preventive examination.  Review of Systems    No ROS. Medicare Wellness Visit. Additional risk factors are reflected in social history. Cardiac Risk Factors include: advanced age (>64men, >39 women);dyslipidemia;family history of premature cardiovascular disease;male gender     Objective:    Today's Vitals   12/18/20 0905  BP: 122/80  Pulse: 70  Resp: 16  Temp: 98 F (36.7 C)  SpO2: 98%  Weight: 173 lb 3.2 oz (78.6 kg)  Height: 5\' 6"  (1.676 m)  PainSc: 0-No pain   Body mass index is 27.96 kg/m.  Advanced Directives 12/18/2020 12/19/2019 12/17/2018 12/15/2017 02/04/2017 12/10/2016 11/02/2016  Does Patient Have a Medical Advance Directive? Yes Yes No Yes No No No  Type of Advance Directive Living will;Healthcare Power of [place];Living will - [place];Living will - - -  Does patient want to make changes to medical advance directive? No - Patient declined No - Patient declined Yes (ED - Information included in AVS) - - Yes (ED - Information included in AVS) -  Copy of [place] in Chart? No - copy requested No - copy requested - No - copy requested - - -  Would patient like information on creating a medical advance directive? - - - - - - No - Patient declined    Current Medications (verified) Outpatient Encounter Medications as of 12/18/2020  Medication Sig  . aspirin 81 MG tablet Take 81 mg by mouth daily.  . meclizine (ANTIVERT) 12.5 MG tablet Take 1 tablet (12.5 mg total) by mouth 3 (three) times daily as needed for dizziness.  . meloxicam (MOBIC) 15 MG tablet Take 1 tablet (15 mg total) by mouth daily.  . Multiple Vitamin (MULTIVITAMIN) tablet Take 1 tablet by mouth daily.  . Omega-3 Fatty Acids (FISH OIL PO) Take 1 capsule by mouth daily.  Marland Kitchen omeprazole (PRILOSEC OTC) 20 MG tablet Take 20 mg by mouth daily.  .  tamsulosin (FLOMAX) 0.4 MG CAPS capsule Take 1 capsule (0.4 mg total) by mouth at bedtime.  . traMADol (ULTRAM) 50 MG tablet TAKE 1 TABLET BY MOUTH EVERY 12 HOURS AS NEEDED  . Vitamin D, Cholecalciferol, 1000 units CAPS Take by mouth.   No facility-administered encounter medications on file as of 12/18/2020.    Allergies (verified) Patient has no known allergies.   History: Past Medical History:  Diagnosis Date  . Acute pyelonephritis 11/01/2016  . Adenomatous polyps of colon 01/2017   no f/u - age  . BPH (benign prostatic hyperplasia)   . Diverticulosis   . Hypertension    Past Surgical History:  Procedure Laterality Date  . CARPAL TUNNEL RELEASE    . COLONOSCOPY     Tics; [facility]  . HERNIA REPAIR    . intra articular steroids     R shoulder  . PROSTATE BIOPSY     Dr Janice Norrie  . VASECTOMY     Family History  Problem Relation Age of Onset  . Heart attack Mother 34  . Diabetes Mother   . Diabetes Sister        TWO sisters  . Diabetes Brother        TWO brothers  . Diabetes type I Brother   . Stroke Neg Hx   . Cancer Neg Hx   . Colon cancer Neg Hx    Social History   Socioeconomic History  .  Marital status: Widowed    Spouse name: Not on file  . Number of children: 4  . Years of education: Not on file  . Highest education level: Not on file  Occupational History  . Not on file  Tobacco Use  . Smoking status: Former Smoker    Quit date: 09/01/1974    Years since quitting: 46.3  . Smokeless tobacco: Never Used  . Tobacco comment: smoked 1956-1976, up to 3 ppd  Vaping Use  . Vaping Use: Never used  Substance and Sexual Activity  . Alcohol use: No  . Drug use: No  . Sexual activity: Not Currently  Other Topics Concern  . Not on file  Social History Narrative  . Not on file   Social Determinants of Health   Financial Resource Strain: Low Risk   . Difficulty of Paying Living Expenses: Not hard at all  Food Insecurity: No Food Insecurity  . Worried About  Charity fundraiser in the Last Year: Never true  . Ran Out of Food in the Last Year: Never true  Transportation Needs: No Transportation Needs  . Lack of Transportation (Medical): No  . Lack of Transportation (Non-Medical): No  Physical Activity: Sufficiently Active  . Days of Exercise per Week: 5 days  . Minutes of Exercise per Session: 30 min  Stress: No Stress Concern Present  . Feeling of Stress : Not at all  Social Connections: Moderately Integrated  . Frequency of Communication with Friends and Family: More than three times a week  . Frequency of Social Gatherings with Friends and Family: More than three times a week  . Attends Religious Services: 1 to 4 times per year  . Active Member of Clubs or Organizations: No  . Attends Archivist Meetings: 1 to 4 times per year  . Marital Status: Widowed    Tobacco Counseling Counseling given: Not Answered Comment: smoked 1956-1976, up to 3 ppd   Clinical Intake:  Pre-visit preparation completed: Yes  Pain : No/denies pain Pain Score: 0-No pain     BMI - recorded: 27.96 Nutritional Status: BMI 25 -29 Overweight Nutritional Risks: None Diabetes: No  How often do you need to have someone help you when you read instructions, pamphlets, or other written materials from your doctor or pharmacy?: 1 - Never What is the last grade level you completed in school?: High School Graduate  Diabetic? no  Interpreter Needed?: No  Information entered by :: Lisette Abu, LPN   Activities of Daily Living In your present state of health, do you have any difficulty performing the following activities: 12/18/2020 12/19/2019  Hearing? N N  Vision? N N  Difficulty concentrating or making decisions? N N  Walking or climbing stairs? N N  Dressing or bathing? N N  Doing errands, shopping? N N  Preparing Food and eating ? N N  Using the Toilet? N N  In the past six months, have you accidently leaked urine? N Y  Do you have  problems with loss of bowel control? N N  Managing your Medications? N N  Managing your Finances? N N  Housekeeping or managing your Housekeeping? N N  Some recent data might be hidden    Patient Care Team: Hoyt Koch, MD as PCP - General (Internal Medicine)  Indicate any recent Medical Services you may have received from other than [facility] providers in the past year (date may be approximate).     Assessment:   This is a  routine wellness examination for [place].  Hearing/Vision screen No exam data present  Dietary issues and exercise activities discussed: Current Exercise Habits: Home exercise routine, Type of exercise: walking;Other - see comments (works on old cars, bicycling), Time (Minutes): 30, Frequency (Times/Week): 5, Weekly Exercise (Minutes/Week): 150, Intensity: Moderate, Exercise limited by: orthopedic condition(s)  Goals    . lose weight to feel healthy     Start to walk daily, eat healthy, and decrease portion size.     . Patient Stated     Stay as healthy and as independent as possible and keep riding my [name].    . Patient Stated     Stay physically active, eat healthy, and enjoy my family and dogs.      Depression Screen PHQ 2/9 Scores 12/18/2020 12/19/2019 06/07/2019 12/17/2018 12/15/2017 12/10/2016 05/01/2016  PHQ - 2 Score 0 0 0 0 1 0 0  PHQ- 9 Score - - - - 2 - -    Fall Risk Fall Risk  12/18/2020 12/19/2019 06/07/2019 12/17/2018 12/15/2017  Falls in the past year? 0 0 0 1 No  Comment - - - - -  Number falls in past yr: 0 0 - 0 -  Injury with Fall? 0 0 - - -  Risk for fall due to : No Fall Risks No Fall Risks - - -  Follow up Falls evaluation completed Falls evaluation completed;Education provided;Falls prevention discussed - Falls prevention discussed;Education provided -    FALL RISK PREVENTION PERTAINING TO THE HOME:  Any stairs in or around the home? No  If so, are there any without handrails? No  Home free of loose throw rugs in  walkways, pet beds, electrical cords, etc? Yes  Adequate lighting in your home to reduce risk of falls? Yes   ASSISTIVE DEVICES UTILIZED TO PREVENT FALLS:  Life alert? No  Use of a cane, walker or w/c? No  Grab bars in the bathroom? No  Shower chair or bench in shower? No  Elevated toilet seat or a handicapped toilet? Yes   TIMED UP AND GO:  Was the test performed? No .  Length of time to ambulate 10 feet: 0 sec.   Gait steady and fast without use of assistive device  Cognitive Function: Normal cognitive status assessed by direct observation by this Nurse Health Advisor. No abnormalities found.   MMSE - Mini Mental State Exam 12/15/2017  Orientation to time 5  Orientation to Place 5  Registration 3  Attention/ Calculation 5  Recall 1  Language- name 2 objects 2  Language- repeat 1  Language- follow 3 step command 3  Language- read & follow direction 1  Write a sentence 1  Copy design 1  Total score 28     6CIT Screen 12/19/2019  What Year? 0 points  What month? 0 points  What time? 0 points  Count back from 20 0 points  Months in reverse 0 points  Repeat phrase 0 points  Total Score 0    Immunizations Immunization History  Administered Date(s) Administered  . Fluad Quad(high Dose 65+) 06/07/2019, 07/19/2020  . Influenza Whole 07/13/2007  . Influenza, High Dose Seasonal PF 06/09/2017, 08/10/2018  . Influenza,inj,Quad PF,6+ Mos 05/01/2016  . Influenza-Unspecified 10/03/2015, 08/10/2018  . Moderna Sars-Covid-2 Vaccination 09/26/2019, 10/21/2019, 07/31/2020  . Pneumococcal Conjugate-13 12/10/2016  . Pneumococcal Polysaccharide-23 10/03/2015  . Td 04/22/2012    TDAP status: Up to date  Flu Vaccine status: Up to date  Pneumococcal vaccine status: Up to date  Covid-19 vaccine status: Completed vaccines  Qualifies for Shingles Vaccine? Yes   Zostavax completed No   Shingrix Completed?: No.    Education has been provided regarding the importance of this  vaccine. Patient has been advised to call insurance company to determine out of pocket expense if they have not yet received this vaccine. Advised may also receive vaccine at local pharmacy or Health Dept. Verbalized acceptance and understanding.  Screening Tests Health Maintenance  Topic Date Due  . INFLUENZA VACCINE  04/01/2021  . TETANUS/TDAP  04/22/2022  . COVID-19 Vaccine  Completed  . PNA vac Low Risk Adult  Completed  . HPV VACCINES  Aged Out    Health Maintenance  There are no preventive care reminders to display for this patient.  Colorectal cancer screening: No longer required.   Lung Cancer Screening: (Low Dose CT Chest recommended if Age 42-80 years, 30 pack-year currently smoking OR have quit w/in 15years.) does not qualify.   Lung Cancer Screening Referral: no  Additional Screening:  Hepatitis C Screening: does not qualify; Completed no  Vision Screening: Recommended annual ophthalmology exams for early detection of glaucoma and other disorders of the eye. Is the patient up to date with their annual eye exam?  Yes  Who is the provider or what is the name of the office in which the patient attends annual eye exams? Tama High, MD. If pt is not established with a provider, would they like to be referred to a provider to establish care? No .   Dental Screening: Recommended annual dental exams for proper oral hygiene  Community Resource Referral / Chronic Care Management: CRR required this visit?  No   CCM required this visit?  No      Plan:     I have personally reviewed and noted the following in the patient's chart:   . Medical and social history . Use of alcohol, tobacco or illicit drugs  . Current medications and supplements . Functional ability and status . Nutritional status . Physical activity . Advanced directives . List of other physicians . Hospitalizations, surgeries, and ER visits in previous 12 months . Vitals . Screenings to  include cognitive, depression, and falls . Referrals and appointments  In addition, I have reviewed and discussed with patient certain preventive protocols, quality metrics, and best practice recommendations. A written personalized care plan for preventive services as well as general preventive health recommendations were provided to patient.     Sheral Flow, LPN   09/16/5788   Nurse Notes:  Normal cognitive status assessed by direct observation by this Nurse Health Advisor. No abnormalities found.  Patient stated that he has no issues with gait or balance; does not use any assistive devices. Medications reviewed with patient; no opioid use noted.

## 2021-02-03 ENCOUNTER — Other Ambulatory Visit: Payer: Self-pay | Admitting: Internal Medicine

## 2021-02-06 DIAGNOSIS — R972 Elevated prostate specific antigen [PSA]: Secondary | ICD-10-CM | POA: Diagnosis not present

## 2021-02-12 ENCOUNTER — Telehealth: Payer: Self-pay | Admitting: Internal Medicine

## 2021-02-12 NOTE — Chronic Care Management (AMB) (Signed)
  Chronic Care Management   Note  02/12/2021 Name: QUSAY VILLADA MRN: [id number] DOB: 26-Oct-1940  CAVEN PERINE is a 80 y.o. year old male who is a primary care patient of Hoyt Koch, MD. I reached out to York Grice by phone today in response to a referral sent by Mr. Jhordan Mckibben Wain's PCP, Hoyt Koch, MD.   Mr. Berenguer was given information about Chronic Care Management services today including:  CCM service includes personalized support from designated clinical staff supervised by his physician, including individualized plan of care and coordination with other care providers 24/7 contact phone numbers for assistance for urgent and routine care needs. Service will only be billed when office clinical staff spend 20 minutes or more in a month to coordinate care. Only one practitioner may furnish and bill the service in a calendar month. The patient may stop CCM services at any time (effective at the end of the month) by phone call to the office staff.   Patient agreed to services and verbal consent obtained.   Follow up plan:   Lauretta Grill Upstream Scheduler

## 2021-02-13 DIAGNOSIS — N401 Enlarged prostate with lower urinary tract symptoms: Secondary | ICD-10-CM | POA: Diagnosis not present

## 2021-02-13 DIAGNOSIS — R350 Frequency of micturition: Secondary | ICD-10-CM | POA: Diagnosis not present

## 2021-02-13 DIAGNOSIS — R972 Elevated prostate specific antigen [PSA]: Secondary | ICD-10-CM | POA: Diagnosis not present

## 2021-03-26 ENCOUNTER — Ambulatory Visit (INDEPENDENT_AMBULATORY_CARE_PROVIDER_SITE_OTHER): Payer: Medicare Other

## 2021-03-26 ENCOUNTER — Other Ambulatory Visit: Payer: Self-pay

## 2021-03-26 ENCOUNTER — Encounter: Payer: Self-pay | Admitting: Internal Medicine

## 2021-03-26 ENCOUNTER — Ambulatory Visit (INDEPENDENT_AMBULATORY_CARE_PROVIDER_SITE_OTHER): Payer: Medicare Other | Admitting: Internal Medicine

## 2021-03-26 VITALS — BP 126/80 | HR 70 | Temp 97.8°F | Resp 18 | Ht 66.0 in | Wt 174.4 lb

## 2021-03-26 DIAGNOSIS — D171 Benign lipomatous neoplasm of skin and subcutaneous tissue of trunk: Secondary | ICD-10-CM

## 2021-03-26 DIAGNOSIS — M1990 Unspecified osteoarthritis, unspecified site: Secondary | ICD-10-CM

## 2021-03-26 DIAGNOSIS — H8110 Benign paroxysmal vertigo, unspecified ear: Secondary | ICD-10-CM

## 2021-03-26 DIAGNOSIS — E782 Mixed hyperlipidemia: Secondary | ICD-10-CM

## 2021-03-26 NOTE — Patient Instructions (Signed)
Visit Information   PATIENT GOALS:   Goals Addressed             This Visit's Progress    Manage Pain-Osteoarthritis       Timeframe:  Long-Range Goal Priority:  High Start Date:  03/26/2021                           Expected End Date: 09/26/2021                      Follow Up Date 07/11/2021   - call for medicine refill 2 or 3 days before it runs out - develop a personal pain management plan - keep track of prescription refills - plan exercise or activity when pain is best controlled - prioritize tasks for the day - stay active - track times pain is worst and when it is best - track what makes the pain worse and what makes it better - use ice or heat for pain relief - work slower and less intense when having pain    Why is this important?   Day-to-day life can be hard when you have joint pain.  Pain medicine is just one piece of the treatment puzzle. There are many things you can do to manage pain and stay strong.   Lifestyle changes like stopping smoking and eating foods with Vitamin D and calcium keeps your bones and muscles healthy. Your joints are better when supported by strong muscles.  You can try these action steps to help you manage your pain.           Consent to CCM Services: Andrew Foster was given information about Chronic Care Management services today including:  CCM service includes personalized support from designated clinical staff supervised by his physician, including individualized plan of care and coordination with other care providers 24/7 contact phone numbers for assistance for urgent and routine care needs. Service will only be billed when office clinical staff spend 20 minutes or more in a month to coordinate care. Only one practitioner may furnish and bill the service in a calendar month. The patient may stop CCM services at any time (effective at the end of the month) by phone call to the office staff. The patient will be responsible for cost  sharing (co-pay) of up to 20% of the service fee (after annual deductible is met).  Patient agreed to services and verbal consent obtained.   Patient verbalizes understanding of instructions provided today and agrees to view in [place].   Face to Face appointment with care management team member scheduled for: 4 months  The patient has been provided with contact information for the care management team and has been advised to call with any health related questions or concerns.   [name], PharmD Clinical Pharmacist, [place]   CLINICAL CARE PLAN: Patient Care Plan: CCM Care Plan     Problem Identified: HLD, OA, BPH, Vertigo, GERD   Priority: High  Onset Date: 03/26/2021     Long-Range Goal: Disease Management   Start Date: 03/26/2021  Expected End Date: 09/26/2021  This Visit's Progress: On track  Priority: High  Note:   Current Barriers:  Unable to independently monitor therapeutic efficacy  Pharmacist Clinical Goal(s):  Patient will achieve adherence to monitoring guidelines and medication adherence to achieve therapeutic efficacy through collaboration with PharmD and provider.   Interventions: 1:1 collaboration with Andrew Koch, MD regarding  development and update of comprehensive plan of care as evidenced by provider attestation and co-signature Inter-disciplinary care team collaboration (see longitudinal plan of care) Comprehensive medication review performed; medication list updated in electronic medical record  Hyperlipidemia: (LDL goal < 100) -Not ideally controlled -Last LDL level 166m/dL (07/19/2020) -Last Triglyceride level 289 mg/dL (07/19/2020) -Current treatment: Aspirin 871m- 1 tablet daily  -Medications previously tried: n/a  -Current dietary patterns: seldom eats red meat, does not much fried foods  -Current exercise habits: busy with caring for his property often working outside  -[employer] Cholesterol goals;  Importance  of limiting foods high in cholesterol; Exercise goal of 150 minutes per week; -Recommended for patient to restart fish oils at 2000-240038maily to help lower triglycerides and LDL levels - patient agreeable to start of a statin should next lipid panel remain elevated from goal   Prediabetes (A1c goal <6.5%) -Controlled -Last A1c 6.2% (07/19/2020) -Current medications: N/a - diet controlled  -Medications previously tried: n/a  -Current home glucose readings -Current meal patterns:  breakfast: egg with turKu[place]usage  / cereal on occasion  lunch: vegetable soup (homemade)  dinner: chicken / vegetable soup, on occasion can be a sandwich  snacks: granola bar, cereal bar drinks: water + a flavoring packet, coffee  -Current exercise: reports that he is fairly active around the house no scheduled exercise or activity at this time  -Educated on A1c and blood sugar goals; Complications of diabetes including kidney damage, retinal damage, and cardiovascular disease; Exercise goal of 150 minutes per week; -Counseled to check feet daily and get yearly eye exams -Counseled on diet and exercise extensively  GERD (Goal: Prevention of acid reflux / treatment of symptoms) -Controlled -Current treatment  Omeprazole 7m74m1 tablet daily  -Medications previously tried: n/a  -Counseled on diet and exercise extensively Recommended to continue current medication  Osteoarthritis of right hip (Goal: Pain Control) -Controlled -Current treatment  Tramadol 50mg13m tablet every 12 hours as needed  Meloxicam 15mg 60mtablet daily  -Medications previously tried: norco  -Recommended to continue current medication  BPH (Goal: prevention of disease progression / treatment of urinary symptoms) -Controlled -Last PSA level 4.41 ng/mL (07/19/2020) -Current treatment  Tamsulosin 0.4mg - 41mapsule daily  -Medications previously tried: n/a  -Recommended to continue current medication  Vertigo (Goal:  prevention of symptoms) -Controlled -Current treatment  Meclizine 12.5mg- 1 62mlet 3 times daily as needed  -Medications previously tried: n/a  -Recommended to continue current medication  Health Maintenance -Vaccine gaps: COVID and shingles vaccine  -Current therapy:  Multivitamin - 1 tablet daily  Vitamin D3 1000units - 1 capsule daily  Glucosamine-Chondroitin 500-400mg - 156mablet daily  -Educated on Cost vs benefit of each product must be carefully weighed by individual consumer -Patient is satisfied with current therapy and denies issues -Recommended to continue current medication  Patient Goals/Self-Care Activities Patient will:  - take medications as prescribed target a minimum of 150 minutes of moderate intensity exercise weekly engage in dietary modifications by moderating carbohydrate intake   Follow Up Plan: Face to Face appointment with care management team member scheduled for:  The patient has been provided with contact information for the care management team and has been advised to call with any health related questions or concerns.

## 2021-03-26 NOTE — Progress Notes (Signed)
Chronic Care Management Pharmacy Note  03/26/2021 Name:  Andrew Foster MRN:  [id number] DOB:  1940/12/04  Summary:  - Patient was in to see PCP earlier today about lipoma on his back, has been growing in size, becoming painful, has been referred to surgeon for removal -Patient reports that otherwise he has been doing well, no issue or complaints, had meclizine refilled, has not had to use very often  - Feels that shoulder and hand pain are controlled on meloxicam and tramadol once daily  Recommendations/Changes made from today's visit: - Recommending for patient to restart fish oils - 2000-2436m daily in setting of hyperlipidemia, patient did not wish to start statin at this time but should LDL remain elevated would be agreeable to in the future  Subjective: Andrew KRATOCHVILis an 80y.o. year old male who is a primary patient of CHoyt Koch MD.  The CCM team was consulted for assistance with disease management and care coordination needs.    Engaged with patient face to face for initial visit in response to provider referral for pharmacy case management and/or care coordination services.   Consent to Services:  The patient was given the following information about Chronic Care Management services today, agreed to services, and gave verbal consent: 1. CCM service includes personalized support from designated clinical staff supervised by the primary care provider, including individualized plan of care and coordination with other care providers 2. 24/7 contact phone numbers for assistance for urgent and routine care needs. 3. Service will only be billed when office clinical staff spend 20 minutes or more in a month to coordinate care. 4. Only one practitioner may furnish and bill the service in a calendar month. 5.The patient may stop CCM services at any time (effective at the end of the month) by phone call to the office staff. 6. The patient will be responsible for cost sharing  (co-pay) of up to 20% of the service fee (after annual deductible is met). Patient agreed to services and consent obtained.  Patient Care Team: CHoyt Koch MD as PCP - General (Internal Medicine) SDanice Goltz MD as Consulting Physician (Ophthalmology) STomasa Blase [facility]as Pharmacist (Pharmacist)  Recent office visits: 03/26/2021 - PCP visit - lipoma of back - growth of lipoma, due to pain levels associated with growth - referred to surgeon for removal  10/16/2020 - PCP visit - refill of meclizine needed - no changes at appointment  07/19/2020 - PCP visit - elevate PSA and blood glucose level - no changes to medications - labs stable   [facility] in [facility] 6 [facility]  Objective:  Lab Results  Component Value Date   CREATININE 0.71 07/19/2020   BUN 17 07/19/2020   GFR 86.83 07/19/2020   GFRNONAA >60 11/04/2016   GFRAA >60 11/04/2016   NA 138 07/19/2020   K 4.5 07/19/2020   CALCIUM 9.4 07/19/2020   CO2 30 07/19/2020   GLUCOSE 96 07/19/2020    Lab Results  Component Value Date/Time   HGBA1C 6.2 07/19/2020 11:39 AM   HGBA1C 6.4 06/07/2019 09:40 AM   GFR 86.83 07/19/2020 11:39 AM   GFR 106.99 06/07/2019 09:40 AM   MICROALBUR 0.9 02/08/2007 11:19 AM    Last diabetic Eye exam:  No results found for: HMDIABEYEEXA  Last diabetic Foot exam:  No results found for: HMDIABFOOTEX   Lab Results  Component Value Date   CHOL 177 07/19/2020   HDL 41.90  07/19/2020   LDLCALC 112 (H) 06/18/2018   LDLDIRECT 116.0 07/19/2020   TRIG 289.0 (H) 07/19/2020   CHOLHDL 4 07/19/2020    Hepatic Function Latest Ref Rng & Units 07/19/2020 06/07/2019 06/18/2018  Total Protein 6.0 - 8.3 g/dL 6.6 6.9 7.0  Albumin 3.5 - 5.2 g/dL 4.2 4.5 4.4  AST 0 - 37 U/L _0 ALT 0 - 53 U/L _1 Alk Phosphatase 39 - 117 U/L 50 64 64  Total Bilirubin 0.2 - 1.2 mg/dL 0.4 0.6 0.5  Bilirubin, Direct 0.0 - 0.3 mg/dL - - -    Lab Results   Component Value Date/Time   TSH 2.28 02/01/2015 01:07 PM   TSH 1.30 12/21/2013 09:23 AM   FREET4 0.80 02/01/2015 01:07 PM   FREET4 0.9 08/02/2009 10:03 AM    CBC Latest Ref Rng & Units 07/19/2020 06/07/2019 06/18/2018  WBC 4.0 - 10.5 K/uL 6.5 6.8 6.2  Hemoglobin 13.0 - 17.0 g/dL 14.9 14.9 15.4  Hematocrit 39.0 - 52.0 % 43.5 43.9 45.3  Platelets 150.0 - 400.0 K/uL 219.0 269.0 266.0    No results found for: VD25OH  Clinical ASCVD: No  The ASCVD Risk score Mikey Bussing DC Jr., et al., 2013) failed to calculate for the following reasons:   The 2013 ASCVD risk score is only valid for ages 29 to 64    Depression screen PHQ 2/9 12/18/2020 12/19/2019 06/07/2019  Decreased Interest 0 0 0  Down, Depressed, Hopeless 0 0 0  PHQ - 2 Score 0 0 0  Altered sleeping - - -  Tired, decreased energy - - -  Change in appetite - - -  Feeling bad or failure about yourself  - - -  Trouble concentrating - - -  Moving slowly or fidgety/restless - - -  Suicidal thoughts - - -  PHQ-9 Score - - -  Difficult doing work/chores - - -    Social History   Tobacco Use  Smoking Status Former   Types: Cigarettes   Quit date: 09/01/1974   Years since quitting: 46.5  Smokeless Tobacco Never  Tobacco Comments   smoked 1956-1976, up to 3 ppd   BP Readings from Last 3 Encounters:  03/26/21 126/80  12/18/20 122/80  10/16/20 (!) 154/88   Pulse Readings from Last 3 Encounters:  03/26/21 70  12/18/20 70  10/16/20 62   Wt Readings from Last 3 Encounters:  03/26/21 174 lb 6.4 oz (79.1 kg)  12/18/20 173 lb 3.2 oz (78.6 kg)  10/16/20 180 lb (81.6 kg)   BMI Readings from Last 3 Encounters:  03/26/21 28.15 kg/m  12/18/20 27.96 kg/m  10/16/20 29.05 kg/m    Assessment/Interventions: Review of patient past medical history, allergies, medications, health status, including review of consultants reports, laboratory and other test data, was performed as part of comprehensive evaluation and provision of chronic care  management services.   SDOH:  (Social Determinants of Health) assessments and interventions performed: Yes  SDOH Screenings   Alcohol Screen: Low Risk    Last Alcohol Screening Score (AUDIT): 0  Depression (PHQ2-9): Low Risk    PHQ-2 Score: 0  Financial Resource Strain: Low Risk    Difficulty of Paying Living Expenses: Not hard at all  Food Insecurity: No Food Insecurity   Worried About Charity fundraiser in the Last Year: Never true   Ran Out of Food in the Last Year: Never true  Housing: Low Risk    Last Housing Risk Score: 0  Physical Activity: Sufficiently Active   Days of Exercise per Week: 5 days   Minutes of Exercise per Session: 30 min  Social Connections: Moderately Integrated   Frequency of Communication with Friends and Family: More than three times a week   Frequency of Social Gatherings with Friends and Family: More than three times a week   Attends Religious Services: 1 to 4 times per year   Active Member of [employer] or Organizations: No   Attends Archivist Meetings: 1 to 4 times per year   Marital Status: Widowed  Stress: No Stress Concern Present   Feeling of Stress : Not at all  Tobacco Use: Medium Risk   Smoking Tobacco Use: Former   Smokeless Tobacco Use: Never  Transportation Needs: No Data processing manager (Medical): No   Lack of Transportation (Non-Medical): No    CCM Care Plan  No Known Allergies  Medications Reviewed Today     Reviewed by [name], [facility] (Pharmacist) on 03/26/21 at [place] List Status: <None>   Medication Order Taking? Sig Documenting Provider Last Dose Status Informant  aspirin 81 MG tablet [id number] Yes Take 81 mg by mouth daily. [provider] Taking Active Self  glucosamine-chondroitin 500-400 MG tablet [id number] Yes Take 0.5 tablets by mouth daily. [provider] Taking Active   meclizine (ANTIVERT) 12.5 MG tablet [id number] No Take 1 tablet (12.5 mg total) by  mouth 3 (three) times daily as needed for dizziness.  Patient not taking: Reported on 03/26/2021   Hoyt Koch, MD Not Taking Active   meloxicam [facility]) 15 MG tablet [id number] Yes Take 1 tablet (15 mg total) by mouth daily. Hoyt Koch, MD Taking Active   Multiple Vitamin (MULTIVITAMIN) tablet [id number] Yes Take 1 tablet by mouth daily. [provider] Taking Active Self  omeprazole (PRILOSEC OTC) 20 MG tablet [id number] Yes Take 20 mg by mouth daily. [provider] Taking Active Self  tamsulosin (FLOMAX) 0.4 MG CAPS capsule [id number] Yes Take 1 capsule (0.4 mg total) by mouth at bedtime. Hoyt Koch, MD Taking Active   traMADol Veatrice Bourbon) 50 MG tablet [id number] Yes TAKE 1 TABLET BY MOUTH EVERY 12 HOURS AS NEEDED  Patient taking differently: Take 50 mg by mouth daily.   Hoyt Koch, MD Taking Active   Vitamin D, Cholecalciferol, 1000 units CAPS [id number] Yes Take by mouth. [provider] Taking Active             Patient Active Problem List   Diagnosis Date Noted   BPPV (benign paroxysmal positional vertigo) 10/16/2020   Arthritis 06/18/2018   Ventral hernia 06/10/2017   Routine general medical examination at a health care facility 09/14/2015   Elevated alkaline phosphatase level 10/19/2014   Mixed hyperlipidemia 02/28/2013   Lipoma of back 11/22/2009   ELEVATED PROSTATE SPECIFIC ANTIGEN 11/06/2008   Esophageal reflux 05/01/2008   HYPERGLYCEMIA, FASTING 05/01/2008    Immunization History  Administered Date(s) Administered   Fluad Quad(high Dose 65+) 06/07/2019, 07/19/2020   Influenza Whole 07/13/2007   Influenza, High Dose Seasonal PF 06/09/2017, 08/10/2018   Influenza,inj,Quad PF,6+ Mos 05/01/2016   Influenza-Unspecified 10/03/2015, 08/10/2018   Moderna Sars-Covid-2 Vaccination 09/26/2019, 10/21/2019, 07/31/2020   Pneumococcal Conjugate-13 12/10/2016   Pneumococcal Polysaccharide-23 10/03/2015   Td 04/22/2012     Conditions to be addressed/monitored:  Hyperlipidemia, Diabetes, GERD, Osteoarthritis, and BPH  Care Plan : [place]  Updates made by [name], [place] since 03/26/2021 12:00  AM     Problem: HLD, OA, BPH, Vertigo, GERD   Priority: High  Onset Date: 03/26/2021     Long-Range Goal: Disease Management   Start Date: 03/26/2021  Expected End Date: 09/26/2021  This Visit's Progress: On track  Priority: High  Note:   Current Barriers:  Unable to independently monitor therapeutic efficacy  Pharmacist Clinical Goal(s):  Patient will achieve adherence to monitoring guidelines and medication adherence to achieve therapeutic efficacy through collaboration with PharmD and provider.   Interventions: 1:1 collaboration with Hoyt Koch, MD regarding development and update of comprehensive plan of care as evidenced by provider attestation and co-signature Inter-disciplinary care team collaboration (see longitudinal plan of care) Comprehensive medication review performed; medication list updated in electronic medical record  Hyperlipidemia: (LDL goal < 100) -Not ideally controlled -Last LDL level 175m/dL (07/19/2020) -Last Triglyceride level 289 mg/dL (07/19/2020) -Current treatment: Aspirin 856m- 1 tablet daily  -Medications previously tried: n/a  -Current dietary patterns: seldom eats red meat, does not much fried foods  -Current exercise habits: busy with caring for his property often working outside  -[employer] Cholesterol goals;  Importance of limiting foods high in cholesterol; Exercise goal of 150 minutes per week; -Recommended for patient to restart fish oils at 2000-24005maily to help lower triglycerides and LDL levels - patient agreeable to start of a statin should next lipid panel remain elevated from goal   Prediabetes (A1c goal <6.5%) -Controlled -Last A1c 6.2% (07/19/2020) -Current medications: N/a - diet controlled  -Medications previously  tried: n/a  -Current home glucose readings -Current meal patterns:  breakfast: egg with turKuwaitusage  / cereal on occasion  lunch: vegetable soup (homemade)  dinner: chicken / vegetable soup, on occasion can be a sandwich  snacks: granola bar, cereal bar drinks: water + a flavoring packet, coffee  -Current exercise: reports that he is fairly active around the house no scheduled exercise or activity at this time  -Educated on A1c and blood sugar goals; Complications of diabetes including kidney damage, retinal damage, and cardiovascular disease; Exercise goal of 150 minutes per week; -Counseled to check feet daily and get yearly eye exams -Counseled on diet and exercise extensively  GERD (Goal: Prevention of acid reflux / treatment of symptoms) -Controlled -Current treatment  Omeprazole 5m55m1 tablet daily  -Medications previously tried: n/a  -Counseled on diet and exercise extensively Recommended to continue current medication  Osteoarthritis of right hip (Goal: Pain Control) -Controlled -Current treatment  Tramadol 50mg63m tablet every 12 hours as needed  Meloxicam 15mg 39mtablet daily  -Medications previously tried: norco  -Recommended to continue current medication  BPH (Goal: prevention of disease progression / treatment of urinary symptoms) -Controlled -Last PSA level 4.41 ng/mL (07/19/2020) -Current treatment  Tamsulosin 0.4mg - 65mapsule daily  -Medications previously tried: n/a  -Recommended to continue current medication  Vertigo (Goal: prevention of symptoms) -Controlled -Current treatment  Meclizine 12.5mg- 1 78mlet 3 times daily as needed  -Medications previously tried: n/a  -Recommended to continue current medication  Health Maintenance -Vaccine gaps: COVID and shingles vaccine  -Current therapy:  Multivitamin - 1 tablet daily  Vitamin D3 1000units - 1 capsule daily  Glucosamine-Chondroitin 500-400mg - 175mablet daily  -Educated on Cost vs  benefit of each product must be carefully weighed by individual consumer -Patient is satisfied with current therapy and denies issues -Recommended to continue current medication  Patient Goals/Self-Care Activities Patient will:  - take medications as prescribed target  a minimum of 150 minutes of moderate intensity exercise weekly engage in dietary modifications by moderating carbohydrate intake   Follow Up Plan: Face to Face appointment with care management team member scheduled for:  The patient has been provided with contact information for the care management team and has been advised to call with any health related questions or concerns.        Medication Assistance: None required.  Patient affirms current coverage meets needs.  Patient's preferred pharmacy is:  [facility] [address], [place] [address] Phone: [phone] Fax: [phone]   Uses pill box? Yes Pt endorses 100% compliance  Care Plan and Follow Up Patient Decision:  Patient agrees to Care Plan and Follow-up.  Plan: Face to Face appointment with care management team member scheduled for: 07/2021 and The patient has been provided with contact information for the care management team and has been advised to call with any health related questions or concerns.   [name], PharmD Clinical Pharmacist, [place]

## 2021-03-26 NOTE — Patient Instructions (Signed)
The spot on the back is a lipoma so we will have you talk to a surgeon to talk to them about getting it off.

## 2021-03-26 NOTE — Progress Notes (Signed)
   Subjective:   Patient ID: Andrew Foster, male    DOB: 03/03/1941, 80 y.o.   MRN: [id number]  HPI The patient is an 80 YO man coming in for growing growth on his back which is painful at times. Son feels it is doubled in the last year. He cannot see well.   Review of Systems  Constitutional: Negative.   HENT: Negative.    Eyes: Negative.   Respiratory:  Negative for cough, chest tightness and shortness of breath.   Cardiovascular:  Negative for chest pain, palpitations and leg swelling.  Gastrointestinal:  Negative for abdominal distention, abdominal pain, constipation, diarrhea, nausea and vomiting.  Musculoskeletal:  Positive for myalgias.  Skin: Negative.   Neurological: Negative.   Psychiatric/Behavioral: Negative.     Objective:  Physical Exam Constitutional:      Appearance: He is well-developed.  HENT:     Head: Normocephalic and atraumatic.  Cardiovascular:     Rate and Rhythm: Normal rate and regular rhythm.  Pulmonary:     Effort: Pulmonary effort is normal. No respiratory distress.     Breath sounds: Normal breath sounds. No wheezing or rales.     Comments: Lipoma left scapular region with soreness to manipulation Abdominal:     General: Bowel sounds are normal. There is no distension.     Palpations: Abdomen is soft.     Tenderness: There is no abdominal tenderness. There is no rebound.  Musculoskeletal:     Cervical back: Normal range of motion.  Skin:    General: Skin is warm and dry.  Neurological:     Mental Status: He is alert and oriented to person, place, and time.     Coordination: Coordination normal.    Vitals:   03/26/21 1032  BP: 126/80  Pulse: 70  Resp: 18  Temp: 97.8 F (36.6 C)  TempSrc: Oral  SpO2: 96%  Weight: 174 lb 6.4 oz (79.1 kg)  Height: '5\' 6"'$  (1.676 m)    This visit occurred during the SARS-CoV-2 public health emergency.  Safety protocols were in place, including screening questions prior to the visit, additional usage of  staff PPE, and extensive cleaning of exam room while observing appropriate contact time as indicated for disinfecting solutions.   Assessment & Plan:

## 2021-03-26 NOTE — Assessment & Plan Note (Addendum)
This does appear to be larger than prior. We discussed the benign nature of these but since his is starting to hurt we will refer to surgeon for removal. Can use meloxicam or tylenol for pain as needed in the meantime.

## 2021-05-27 ENCOUNTER — Telehealth: Payer: Self-pay

## 2021-05-27 NOTE — Progress Notes (Signed)
    Chronic Care Management Pharmacy Assistant   Name: Andrew Foster  MRN: [id number] DOB: [birth year]   Reason for Encounter: Disease State General Assessment Call     Recent office visits:  None noted   Recent consult visits:  None noted   Hospital visits:  None in previous 6 months  Medications: Outpatient Encounter Medications as of 05/27/2021  Medication Sig   aspirin 81 MG tablet Take 81 mg by mouth daily.   glucosamine-chondroitin 500-400 MG tablet Take 0.5 tablets by mouth daily.   meclizine (ANTIVERT) 12.5 MG tablet Take 1 tablet (12.5 mg total) by mouth 3 (three) times daily as needed for dizziness. (Patient not taking: Reported on 03/26/2021)   meloxicam (MOBIC) 15 MG tablet Take 1 tablet (15 mg total) by mouth daily.   Multiple Vitamin (MULTIVITAMIN) tablet Take 1 tablet by mouth daily.   omeprazole (PRILOSEC OTC) 20 MG tablet Take 20 mg by mouth daily.   tamsulosin (FLOMAX) 0.4 MG CAPS capsule Take 1 capsule (0.4 mg total) by mouth at bedtime.   traMADol (ULTRAM) 50 MG tablet TAKE 1 TABLET BY MOUTH EVERY 12 HOURS AS NEEDED (Patient taking differently: Take 50 mg by mouth daily.)   Vitamin D, Cholecalciferol, 1000 units CAPS Take by mouth.   No facility-administered encounter medications on file as of 05/27/2021.    Care Gaps: Zoster Vaccines- Shingrix Never done  COVID-19 Vaccine Last completed: Jul 31, 2020 INFLUENZA VACCINE Last completed: Jul 19, 2020  Have you had any problems recently with your health? Patient states that he has no problems with health currently.   Have you had any problems with your pharmacy? Patient states no problems with pharmacy.   What issues or side effects are you having with your medications? Patient states that he has no side affects currently.   What would you like me to pass along to [facility] for them to help you with?  N/a  What can we do to take care of you better? N/a  Misc Comment: Andrew Foster is a very  pleasant man. He states that he has 6 rescue dogs, One named  snoopy which is a large mixed breed that he got when he was 80 years old, one names storm who is afraid of storms, Another large black lab, and 3 medium sized dogs. 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 Overall, Andrew Foster is doing well and has no concerns at the moment. His next appointment is scheduled on 07/22/21 @ 11am   Star Rating Drugs: None    Andee Poles, [place]

## 2021-07-22 ENCOUNTER — Ambulatory Visit: Payer: Medicare Other | Admitting: Internal Medicine

## 2021-07-22 ENCOUNTER — Ambulatory Visit: Payer: Medicare Other

## 2021-07-31 ENCOUNTER — Other Ambulatory Visit: Payer: Self-pay

## 2021-07-31 ENCOUNTER — Ambulatory Visit (INDEPENDENT_AMBULATORY_CARE_PROVIDER_SITE_OTHER): Payer: Medicare Other | Admitting: Internal Medicine

## 2021-07-31 ENCOUNTER — Encounter: Payer: Self-pay | Admitting: Internal Medicine

## 2021-07-31 VITALS — BP 122/68 | HR 64 | Resp 18 | Ht 66.0 in | Wt 174.2 lb

## 2021-07-31 DIAGNOSIS — E782 Mixed hyperlipidemia: Secondary | ICD-10-CM

## 2021-07-31 DIAGNOSIS — R7303 Prediabetes: Secondary | ICD-10-CM | POA: Diagnosis not present

## 2021-07-31 DIAGNOSIS — Z23 Encounter for immunization: Secondary | ICD-10-CM | POA: Diagnosis not present

## 2021-07-31 DIAGNOSIS — R972 Elevated prostate specific antigen [PSA]: Secondary | ICD-10-CM | POA: Diagnosis not present

## 2021-07-31 DIAGNOSIS — M1990 Unspecified osteoarthritis, unspecified site: Secondary | ICD-10-CM

## 2021-07-31 LAB — CBC
HCT: 44.7 % (ref 39.0–52.0)
Hemoglobin: 15 g/dL (ref 13.0–17.0)
MCHC: 33.5 g/dL (ref 30.0–36.0)
MCV: 98.2 fl (ref 78.0–100.0)
Platelets: 244 10*3/uL (ref 150.0–400.0)
RBC: 4.55 Mil/uL (ref 4.22–5.81)
RDW: 12.4 % (ref 11.5–15.5)
WBC: 8.2 10*3/uL (ref 4.0–10.5)

## 2021-07-31 LAB — PSA: PSA: 4.31 ng/mL — ABNORMAL HIGH (ref 0.10–4.00)

## 2021-07-31 LAB — COMPREHENSIVE METABOLIC PANEL
ALT: 19 U/L (ref 0–53)
AST: 22 U/L (ref 0–37)
Albumin: 4.6 g/dL (ref 3.5–5.2)
Alkaline Phosphatase: 55 U/L (ref 39–117)
BUN: 25 mg/dL — ABNORMAL HIGH (ref 6–23)
CO2: 27 mEq/L (ref 19–32)
Calcium: 9.8 mg/dL (ref 8.4–10.5)
Chloride: 101 mEq/L (ref 96–112)
Creatinine, Ser: 1.11 mg/dL (ref 0.40–1.50)
GFR: 62.39 mL/min (ref >60.00)
Glucose, Bld: 81 mg/dL (ref 70–99)
Potassium: 4.4 mEq/L (ref 3.5–5.1)
Sodium: 137 mEq/L (ref 135–145)
Total Bilirubin: 0.6 mg/dL (ref 0.2–1.2)
Total Protein: 7.2 g/dL (ref 6.0–8.3)

## 2021-07-31 LAB — LIPID PANEL
Cholesterol: 215 mg/dL — ABNORMAL HIGH (ref 0–200)
HDL: 51.3 mg/dL (ref >39.00)
NonHDL: 163.79
Total CHOL/HDL Ratio: 4
Triglycerides: 230 mg/dL — ABNORMAL HIGH (ref 0.0–149.0)
VLDL: 46 mg/dL — ABNORMAL HIGH (ref 0.0–40.0)

## 2021-07-31 LAB — LDL CHOLESTEROL, DIRECT: Direct LDL: 150 mg/dL

## 2021-07-31 LAB — HEMOGLOBIN A1C: Hgb A1c MFr Bld: 6.2 % (ref 4.6–6.5)

## 2021-07-31 NOTE — Progress Notes (Signed)
   Subjective:   Patient ID: Andrew Foster, male    DOB: 12/23/1940, 80 y.o.   MRN: [id number]  HPI The patient is an 80 YO man coming in for follow up medical conditions.   Review of Systems  Constitutional: Negative.   HENT: Negative.    Eyes: Negative.   Respiratory:  Negative for cough, chest tightness and shortness of breath.   Cardiovascular:  Negative for chest pain, palpitations and leg swelling.  Gastrointestinal:  Negative for abdominal distention, abdominal pain, constipation, diarrhea, nausea and vomiting.  Musculoskeletal:  Positive for arthralgias.  Skin: Negative.   Neurological: Negative.   Psychiatric/Behavioral: Negative.     Objective:  Physical Exam Constitutional:      Appearance: He is well-developed.  HENT:     Head: Normocephalic and atraumatic.  Cardiovascular:     Rate and Rhythm: Normal rate and regular rhythm.  Pulmonary:     Effort: Pulmonary effort is normal. No respiratory distress.     Breath sounds: Normal breath sounds. No wheezing or rales.  Abdominal:     General: Bowel sounds are normal. There is no distension.     Palpations: Abdomen is soft.     Tenderness: There is no abdominal tenderness. There is no rebound.  Musculoskeletal:        General: Tenderness present.     Cervical back: Normal range of motion.  Skin:    General: Skin is warm and dry.  Neurological:     Mental Status: He is alert and oriented to person, place, and time.     Coordination: Coordination normal.    Vitals:   07/31/21 1116  BP: 122/68  Pulse: 64  Resp: 18  SpO2: 98%  Weight: 174 lb 3.2 oz (79 kg)  Height: 5\' 6"  (1.676 m)    This visit occurred during the SARS-CoV-2 public health emergency.  Safety protocols were in place, including screening questions prior to the visit, additional usage of staff PPE, and extensive cleaning of exam room while observing appropriate contact time as indicated for disinfecting solutions.   Assessment & Plan:  Flu shot  given at visit

## 2021-07-31 NOTE — Patient Instructions (Signed)
We have given you the flu shot and check the blood work.

## 2021-08-02 ENCOUNTER — Encounter: Payer: Self-pay | Admitting: Internal Medicine

## 2021-08-02 NOTE — Assessment & Plan Note (Signed)
Checking HgA1c, strong family history of diabetes and needs ongoing yearly HgA1c.

## 2021-08-02 NOTE — Assessment & Plan Note (Addendum)
Overall stable to mild progression. He is active. Uses tramadol daily as needed and can use up to BID prn. Refill when needed. [place] narcotic database. Takes meloxicam daily to help with pain.

## 2021-08-02 NOTE — Assessment & Plan Note (Signed)
Ordered PSA today, no progression of symptoms has stable frequency. Taking flomax 0.4 mg daily and advised we could increase to 0.8 and he declines today.

## 2021-08-02 NOTE — Assessment & Plan Note (Signed)
Checking lipid panel and not taking medication at this time. Adjust as needed.

## 2021-08-06 ENCOUNTER — Other Ambulatory Visit: Payer: Self-pay | Admitting: Internal Medicine

## 2021-08-06 MED ORDER — PRAVASTATIN SODIUM 20 MG PO TABS: 20.0000 mg | ORAL_TABLET | Freq: Every day | ORAL | 3 refills | Status: DC

## 2021-08-18 ENCOUNTER — Other Ambulatory Visit: Payer: Self-pay | Admitting: Internal Medicine

## 2021-10-01 ENCOUNTER — Other Ambulatory Visit: Payer: Self-pay | Admitting: Internal Medicine

## 2021-10-02 ENCOUNTER — Other Ambulatory Visit: Payer: Self-pay | Admitting: Internal Medicine

## 2021-10-17 ENCOUNTER — Ambulatory Visit (INDEPENDENT_AMBULATORY_CARE_PROVIDER_SITE_OTHER): Payer: Medicare Other

## 2021-10-17 DIAGNOSIS — M1990 Unspecified osteoarthritis, unspecified site: Secondary | ICD-10-CM

## 2021-10-17 DIAGNOSIS — E782 Mixed hyperlipidemia: Secondary | ICD-10-CM

## 2021-10-17 NOTE — Patient Instructions (Signed)
Visit Information  Following are the goals we discussed today:   Manage My Medicine   Timeframe:  Long-Range Goal Priority:  Medium Start Date:  10/17/2021                           Expected End Date: 10/17/2022                      Follow Up Date 04/16/2022   - call for medicine refill 2 or 3 days before it runs out - call if I am sick and can't take my medicine - keep a list of all the medicines I take; vitamins and herbals too - learn to read medicine labels    Why is this important?   These steps will help you keep on track with your medicines.  Plan: Telephone follow up appointment with care management team member scheduled for:  6 months The patient has been provided with contact information for the care management team and has been advised to call with any health related questions or concerns.   [name], PharmD Clinical Pharmacist, [facility]   Please call the care guide team at [phone] if you need to cancel or reschedule your appointment.   Patient verbalizes understanding of instructions and care plan provided today and agrees to view in [place]. Active MyChart status confirmed with patient.

## 2021-10-17 NOTE — Progress Notes (Cosign Needed)
Chronic Care Management Pharmacy Note  10/17/2021 Name:  Andrew Foster MRN:  [id number] DOB:  12-15-1940  Summary: -Patient reports since starting pravastatin he has not had any issues or concerns with AE - reports adherence to current medications  -Notes that he has been having issues with left knee pain recently which is getting worse, asked to be scheduled to see PCP for evaluation  -Notes to 1 episode of moderate rib cage / back pain that lasted for 2.5 hours recently, has subsided since - no issues with pain since - patient denies jaw / arm pain or any other symptoms - denies any cardiac symptoms   Recommendations/Changes made from today's visit: -Recommending no changes to medications, patient scheduled for PCP visit as requested, would recommend for lipid panel to be drawn - adjust pravastatin dose if needed  -advised for patient to receive shingrix vaccinations with next visit to pharmacy now that vaccine is covered under pharmacy benefits - patient agreeable   Subjective: Andrew Foster is an 81 y.o. year old male who is a primary patient of Hoyt Koch, MD.  The CCM team was consulted for assistance with disease management and care coordination needs.    Engaged with patient by telephone for follow up visit in response to provider referral for pharmacy case management and/or care coordination services.   Consent to Services:  The patient was given the following information about Chronic Care Management services today, agreed to services, and gave verbal consent: 1. CCM service includes personalized support from designated clinical staff supervised by the primary care provider, including individualized plan of care and coordination with other care providers 2. 24/7 contact phone numbers for assistance for urgent and routine care needs. 3. Service will only be billed when office clinical staff spend 20 minutes or more in a month to coordinate care. 4. Only one practitioner  may furnish and bill the service in a calendar month. 5.The patient may stop CCM services at any time (effective at the end of the month) by phone call to the office staff. 6. The patient will be responsible for cost sharing (co-pay) of up to 20% of the service fee (after annual deductible is met). Patient agreed to services and consent obtained.  Patient Care Team: Hoyt Koch, MD as PCP - General (Internal Medicine) Danice Goltz, MD as Consulting Physician (Ophthalmology) Delice Bison Darnelle Maffucci, [facility] as Pharmacist (Pharmacist)  Recent office visits: 07/31/2021 - Dr. Sharlet Salina - flu shot given, - patient to restart pravastatin 38m daily   [facility]  Objective:  Lab Results  Component Value Date   CREATININE 1.11 07/31/2021   BUN 25 (H) 07/31/2021   GFR 62.39 07/31/2021   GFRNONAA >60 11/04/2016   GFRAA >60 11/04/2016   NA 137 07/31/2021   K 4.4 07/31/2021   CALCIUM 9.8 07/31/2021   CO2 27 07/31/2021   GLUCOSE 81 07/31/2021    Lab Results  Component Value Date/Time   HGBA1C 6.2 07/31/2021 11:55 AM   HGBA1C 6.2 07/19/2020 11:39 AM   GFR 62.39 07/31/2021 11:55 AM   GFR 86.83 07/19/2020 11:39 AM   MICROALBUR 0.9 02/08/2007 11:19 AM    Last diabetic Eye exam:  No results found for: HMDIABEYEEXA  Last diabetic Foot exam:  No results found for: HMDIABFOOTEX   Lab Results  Component Value Date   CHOL 215 (H) 07/31/2021   HDL 51.30 07/31/2021   LDLCALC 112 (H)  06/18/2018   LDLDIRECT 150.0 07/31/2021   TRIG 230.0 (H) 07/31/2021   CHOLHDL 4 07/31/2021    Hepatic Function Latest Ref Rng & Units 07/31/2021 07/19/2020 06/07/2019  Total Protein 6.0 - 8.3 g/dL 7.2 6.6 6.9  Albumin 3.5 - 5.2 g/dL 4.6 4.2 4.5  AST 0 - 37 U/L 22 19 20   ALT 0 - 53 U/L 19 14 18   Alk Phosphatase 39 - 117 U/L 55 50 64  Total Bilirubin 0.2 - 1.2 mg/dL 0.6 0.4 0.6  Bilirubin, Direct 0.0 - 0.3 mg/dL - - -    Lab Results   Component Value Date/Time   TSH 2.28 02/01/2015 01:07 PM   TSH 1.30 12/21/2013 09:23 AM   FREET4 0.80 02/01/2015 01:07 PM   FREET4 0.9 08/02/2009 10:03 AM    CBC Latest Ref Rng & Units 07/31/2021 07/19/2020 06/07/2019  WBC 4.0 - 10.5 K/uL 8.2 6.5 6.8  Hemoglobin 13.0 - 17.0 g/dL 15.0 14.9 14.9  Hematocrit 39.0 - 52.0 % 44.7 43.5 43.9  Platelets 150.0 - 400.0 K/uL 244.0 219.0 269.0    No results found for: VD25OH  Clinical ASCVD: No  The ASCVD Risk score (Arnett DK, et al., 2019) failed to calculate for the following reasons:   The 2019 ASCVD risk score is only valid for ages 27 to 76    Depression screen PHQ 2/9 12/18/2020 12/19/2019 06/07/2019  Decreased Interest 0 0 0  Down, Depressed, Hopeless 0 0 0  PHQ - 2 Score 0 0 0  Altered sleeping - - -  Tired, decreased energy - - -  Change in appetite - - -  Feeling bad or failure about yourself  - - -  Trouble concentrating - - -  Moving slowly or fidgety/restless - - -  Suicidal thoughts - - -  PHQ-9 Score - - -  Difficult doing work/chores - - -    Social History   Tobacco Use  Smoking Status Former   Types: Cigarettes   Quit date: 09/01/1974   Years since quitting: 47.1  Smokeless Tobacco Never  Tobacco Comments   smoked 1956-1976, up to 3 ppd   BP Readings from Last 3 Encounters:  07/31/21 122/68  03/26/21 126/80  12/18/20 122/80   Pulse Readings from Last 3 Encounters:  07/31/21 64  03/26/21 70  12/18/20 70   Wt Readings from Last 3 Encounters:  07/31/21 174 lb 3.2 oz (79 kg)  03/26/21 174 lb 6.4 oz (79.1 kg)  12/18/20 173 lb 3.2 oz (78.6 kg)   BMI Readings from Last 3 Encounters:  07/31/21 28.12 kg/m  03/26/21 28.15 kg/m  12/18/20 27.96 kg/m    Assessment/Interventions: Review of patient past medical history, allergies, medications, health status, including review of consultants reports, laboratory and other test data, was performed as part of comprehensive evaluation and provision of chronic care  management services.   SDOH:  (Social Determinants of Health) assessments and interventions performed: Yes  SDOH Screenings   Alcohol Screen: Low Risk    Last Alcohol Screening Score (AUDIT): 0  Depression (PHQ2-9): Low Risk    PHQ-2 Score: 0  Financial Resource Strain: Low Risk    Difficulty of Paying Living Expenses: Not hard at all  Food Insecurity: No Food Insecurity   Worried About Charity fundraiser in the Last Year: Never true   Ran Out of Food in the Last Year: Never true  Housing: Low Risk    Last Housing Risk Score: 0  Physical Activity: Sufficiently Active  Days of Exercise per Week: 5 days   Minutes of Exercise per Session: 30 min  Social Connections: Moderately Integrated   Frequency of Communication with Friends and Family: More than three times a week   Frequency of Social Gatherings with Friends and Family: More than three times a week   Attends Religious Services: 1 to 4 times per year   Active Member of [employer] or Organizations: No   Attends Archivist Meetings: 1 to 4 times per year   Marital Status: Widowed  Stress: No Stress Concern Present   Feeling of Stress : Not at all  Tobacco Use: Medium Risk   Smoking Tobacco Use: Former   Smokeless Tobacco Use: Never   Passive Exposure: Not on file  Transportation Needs: No Transportation Needs   Lack of Transportation (Medical): No   Lack of Transportation (Non-Medical): No    CCM Care Plan  No Known Allergies  Medications Reviewed Today     Reviewed by [name], [facility] (Pharmacist) on 10/17/21 at 1303  Med List Status: <None>   Medication Order Taking? Sig Documenting Provider Last Dose Status Informant  aspirin 81 MG tablet [id number] Yes Take 81 mg by mouth daily. [provider] Taking Active Self  glucosamine-chondroitin 500-400 MG tablet [id number] Yes Take 0.5 tablets by mouth daily. [provider] Taking Active   meclizine (ANTIVERT) 12.5 MG tablet [id number] Yes  Take 1 tablet (12.5 mg total) by mouth 3 (three) times daily as needed for dizziness. Hoyt Koch, MD Taking Active   meloxicam [facility] 15 MG tablet [id number] Yes Take 1 tablet by mouth once daily Hoyt Koch, MD Taking Active   Multiple Vitamin (MULTIVITAMIN) tablet [id number] Yes Take 1 tablet by mouth daily. [provider] Taking Active Self  omeprazole (PRILOSEC OTC) 20 MG tablet [id number] Yes Take 20 mg by mouth daily. [provider] Taking Active Self  pravastatin (PRAVACHOL) 20 MG tablet [id number] Yes Take 1 tablet (20 mg total) by mouth daily. Hoyt Koch, MD Taking Active   tamsulosin [facility]) 0.4 MG CAPS capsule [id number] Yes Take 1 capsule (0.4 mg total) by mouth at bedtime. Hoyt Koch, MD Taking Active   traMADol Veat[facility]bon) 50 MG tablet [id number] Yes Take 1 tablet (50 mg total) by mouth daily. Hoyt Koch, MD Taking Active   Vitamin D, Cholecalciferol, 1000 units CAPS [id number] Yes Take by mouth. [provider] Taking Active             Patient Active Problem List   Diagnosis Date Noted   BPPV (benign paroxysmal positional vertigo) 10/16/2020   Arthritis 06/18/2018   Ventral hernia 06/10/2017   Routine general medical examination at a health care facility 09/14/2015   Elevated alkaline phosphatase level 10/19/2014   Mixed hyperlipidemia 02/28/2013   Lipoma of back 11/22/2009   ELEVATED PROSTATE SPECIFIC ANTIGEN 11/06/2008   Esophageal reflux 05/01/2008   Pre-diabetes 05/01/2008    Immunization History  Administered Date(s) Administered   Fluad Quad(high Dose 65+) 06/07/2019, 07/19/2020, 07/31/2021   Influenza Whole 07/13/2007   Influenza, High Dose Seasonal PF 06/09/2017, 08/10/2018   Influenza,inj,Quad PF,6+ Mos 05/01/2016   Influenza-Unspecified 10/03/2015, 08/10/2018   Moderna Sars-Covid-2 Vaccination 09/26/2019, 10/21/2019, 07/31/2020   Pneumococcal Conjugate-13 12/10/2016   Pneumococcal  Polysaccharide-23 10/03/2015   Td 04/22/2012    Conditions to be addressed/monitored:  Hyperlipidemia, Diabetes, GERD, Osteoarthritis, and BPH  Care Plan : [place]  Updates made by [name], [place] since 10/17/2021 12:00  AM     Problem: HLD, OA, BPH, Vertigo, GERD   Priority: High  Onset Date: 03/26/2021     Long-Range Goal: Disease Management   Start Date: 03/26/2021  Expected End Date: 10/17/2022  This Visit's Progress: On track  Recent Progress: On track  Priority: High  Note:   Current Barriers:  Unable to independently monitor therapeutic efficacy  Pharmacist Clinical Goal(s):  Patient will achieve adherence to monitoring guidelines and medication adherence to achieve therapeutic efficacy through collaboration with PharmD and provider.   Interventions: 1:1 collaboration with Hoyt Koch, MD regarding development and update of comprehensive plan of care as evidenced by provider attestation and co-signature Inter-disciplinary care team collaboration (see longitudinal plan of care) Comprehensive medication review performed; medication list updated in electronic medical record  Hyperlipidemia: (LDL goal < 100) -Not ideally controlled - patient started pravastatin with last PCP appointment  -Last LDL 181m/dL -Current treatment: Aspirin 872m- 1 tablet daily  Pravastatin 2066maily  -Medications previously tried: n/a  -Current dietary patterns: seldom eats red meat, does not much fried foods  -Current exercise habits: busy with caring for his property often working outside  -[employer] Cholesterol goals;  Importance of limiting foods high in cholesterol; Exercise goal of 150 minutes per week; -Recommended for patient to continue current medications - due for repeat lipid panel with next PCP appointment   Prediabetes (A1c goal <6.5%) -Controlled - stable  - Lab Results  Component Value Date   HGBA1C 6.2 07/31/2021  -Current medications: N/a -  diet controlled  -Medications previously tried: n/a  -Current home glucose readings -Current meal patterns:  breakfast: egg with turKuwaitusage  / cereal on occasion  lunch: vegetable soup (homemade)  dinner: chicken / vegetable soup, on occasion can be a sandwich  snacks: granola bar, cereal bar drinks: water + a flavoring packet, coffee  -Current exercise: reports that he is fairly active around the house no scheduled exercise or activity at this time  -Educated on A1c and blood sugar goals; Complications of diabetes including kidney damage, retinal damage, and cardiovascular disease; Exercise goal of 150 minutes per week; -Counseled to check feet daily and get yearly eye exams -Counseled on diet and exercise extensively  GERD (Goal: Prevention of acid reflux / treatment of symptoms) -Controlled -Current treatment  Omeprazole 60m57m1 tablet daily  -Medications previously tried: n/a  -Counseled on diet and exercise extensively Recommended to continue current medication  Osteoarthritis of right hip (Goal: Pain Control) -Controlled - pain in hip is controlled - but pain in left knee has been increasing lately -Current treatment  Tramadol 50mg48m tablet every 12 hours as needed  Meloxicam 15mg 62mtablet daily  -Medications previously tried: norco  -Recommended to continue current medication  BPH (Goal: prevention of disease progression / treatment of urinary symptoms) -Controlled Lab Results  Component Value Date   PSA 4.31 (H) 07/31/2021   PSA 4.41 (H) 07/19/2020   PSA 4.29 (H) 06/07/2019  -Current treatment  Tamsulosin 0.4mg - 51mapsule daily  -Medications previously tried: n/a  -Recommended to continue current medication  Vertigo (Goal: prevention of symptoms) -Controlled -Current treatment  Meclizine 12.5mg- 1 24mlet 3 times daily as needed  -Medications previously tried: n/a  -Recommended to continue current medication  Health Maintenance -Vaccine gaps: COVID  and shingles vaccine  -Current therapy:  Multivitamin - 1 tablet daily  Vitamin D3 1000units - 1 capsule daily  Glucosamine-Chondroitin 500-400mg - 175mablet daily  -Educated  on Cost vs benefit of each product must be carefully weighed by individual consumer -Patient is satisfied with current therapy and denies issues -Recommended to continue current medication  Patient Goals/Self-Care Activities Patient will:  - take medications as prescribed target a minimum of 150 minutes of moderate intensity exercise weekly engage in dietary modifications by moderating carbohydrate intake   Follow Up Plan: Telephone appointment with care management team member scheduled for: 6 months  The patient has been provided with contact information for the care management team and has been advised to call with any health related questions or concerns.         Medication Assistance: None required.  Patient affirms current coverage meets needs.  Patient's preferred pharmacy is:  [facility] [address], [place] [address] Phone: [phone] Fax: [phone]    Uses pill box? Yes Pt endorses 100% compliance  Care Plan and Follow Up Patient Decision:  Patient agrees to Care Plan and Follow-up.  Plan: Telephone follow up appointment with care management team member scheduled for:  6 months The patient has been provided with contact information for the care management team and has been advised to call with any health related questions or concerns.   [name], PharmD Clinical Pharmacist, [place]

## 2021-10-25 ENCOUNTER — Ambulatory Visit (INDEPENDENT_AMBULATORY_CARE_PROVIDER_SITE_OTHER): Payer: Medicare Other | Admitting: Internal Medicine

## 2021-10-25 ENCOUNTER — Other Ambulatory Visit: Payer: Self-pay

## 2021-10-25 ENCOUNTER — Encounter: Payer: Self-pay | Admitting: Internal Medicine

## 2021-10-25 DIAGNOSIS — M1712 Unilateral primary osteoarthritis, left knee: Secondary | ICD-10-CM | POA: Diagnosis not present

## 2021-10-25 DIAGNOSIS — M25562 Pain in left knee: Secondary | ICD-10-CM | POA: Insufficient documentation

## 2021-10-25 MED ORDER — DICLOFENAC SODIUM 75 MG PO TBEC: 75.0000 mg | DELAYED_RELEASE_TABLET | Freq: Two times a day (BID) | ORAL | 1 refills | Status: DC

## 2021-10-25 NOTE — Assessment & Plan Note (Signed)
Rx voltaren and stop meloxicam as he is not having night time coverage of pain. Advised to try otc knee sleeve for support. Offered knee injection he declines today.

## 2021-10-25 NOTE — Progress Notes (Signed)
° °  Subjective:   Patient ID: Andrew Foster, male    DOB: 1940/09/19, 81 y.o.   MRN: [id number]  HPI The patient is an 81 YO man coming in for left knee pain.  Review of Systems  Constitutional:  Negative for activity change, appetite change, fatigue, fever and unexpected weight change.  Respiratory: Negative.    Cardiovascular: Negative.   Musculoskeletal:  Positive for arthralgias and myalgias. Negative for back pain.  Skin: Negative.   Neurological:  Negative for syncope, weakness and numbness.   Objective:  Physical Exam Constitutional:      Appearance: He is well-developed.  HENT:     Head: Normocephalic and atraumatic.  Cardiovascular:     Rate and Rhythm: Normal rate and regular rhythm.  Pulmonary:     Effort: Pulmonary effort is normal. No respiratory distress.     Breath sounds: Normal breath sounds. No wheezing or rales.  Abdominal:     General: Bowel sounds are normal. There is no distension.     Palpations: Abdomen is soft.     Tenderness: There is no abdominal tenderness. There is no rebound.  Musculoskeletal:        General: Tenderness present.     Cervical back: Normal range of motion.  Skin:    General: Skin is warm and dry.  Neurological:     Mental Status: He is alert and oriented to person, place, and time.     Coordination: Coordination normal.    Vitals:   10/25/21 0835  BP: 126/74  Pulse: 76  Resp: 18  SpO2: 96%  Weight: 176 lb 6.4 oz (80 kg)  Height: 5\' 6"  (1.676 m)    This visit occurred during the SARS-CoV-2 public health emergency.  Safety protocols were in place, including screening questions prior to the visit, additional usage of staff PPE, and extensive cleaning of exam room while observing appropriate contact time as indicated for disinfecting solutions.   Assessment & Plan:

## 2021-10-25 NOTE — Patient Instructions (Addendum)
We have sent in voltaren instead of the meloxicam. This medicine is twice a day so you can have better night time coverage.   You can try turmeric over the counter to help also.

## 2021-12-19 ENCOUNTER — Ambulatory Visit (INDEPENDENT_AMBULATORY_CARE_PROVIDER_SITE_OTHER): Payer: Medicare Other

## 2021-12-19 VITALS — BP 120/60 | HR 63 | Temp 98.0°F | Ht 66.0 in | Wt 179.4 lb

## 2021-12-19 DIAGNOSIS — Z0000 Encounter for general adult medical examination without abnormal findings: Secondary | ICD-10-CM

## 2021-12-19 NOTE — Patient Instructions (Signed)
[name] , ?Thank you for taking time to come for your Medicare Wellness Visit. I appreciate your ongoing commitment to your health goals. Please review the following plan we discussed and let me know if I can assist you in the future.  ? ?Screening recommendations/referrals: ?Colonoscopy: Not a candidate for screening due to age ?Recommended yearly ophthalmology/optometry visit for glaucoma screening and checkup ?Recommended yearly dental visit for hygiene and checkup ? ?Vaccinations: ?Influenza vaccine: 07/31/2021 ?Pneumococcal vaccine: 10/03/2015, 12/10/2016 ?Tdap vaccine: 04/22/2012; due every 10 years (due 04/2022) ?Shingles vaccine: never done   ?Covid-19: 09/26/2019, 10/21/2019, 07/31/2020 ? ?Advanced directives: Yes; Please bring a copy of your health care power of attorney and living will to the office at your convenience. ? ?Conditions/risks identified: Yes; Reviewed health maintenance screenings with patient today and relevant education, vaccines, and/or referrals were provided. Continue doing brain stimulating activities (puzzles, reading, adult coloring books, staying active) to keep memory sharp. Continue to eat heart healthy diet (full of fruits, vegetables, whole grains, lean protein, water--limit salt, fat, and sugar intake) and increase physical activity as tolerated. ? ?Next appointment: Please schedule your next Medicare Wellness Visit with your Nurse Health Advisor in 1 year by calling [phone]. ? ?Preventive Care 25 Years and Older, Male ?Preventive care refers to lifestyle choices and visits with your health care provider that can promote health and wellness. ?What does preventive care include? ?A yearly physical exam. This is also called an annual well check. ?Dental exams once or twice a year. ?Routine eye exams. Ask your health care provider how often you should have your eyes checked. ?Personal lifestyle choices, including: ?Daily care of your teeth and gums. ?Regular physical  activity. ?Eating a healthy diet. ?Avoiding tobacco and drug use. ?Limiting alcohol use. ?Practicing safe sex. ?Taking low doses of aspirin every day. ?Taking vitamin and mineral supplements as recommended by your health care provider. ?What happens during an annual well check? ?The services and screenings done by your health care provider during your annual well check will depend on your age, overall health, lifestyle risk factors, and family history of disease. ?Counseling  ?Your health care provider may ask you questions about your: ?Alcohol use. ?Tobacco use. ?Drug use. ?Emotional well-being. ?Home and relationship well-being. ?Sexual activity. ?Eating habits. ?History of falls. ?Memory and ability to understand (cognition). ?Work and work Statistician. ?Screening  ?You may have the following tests or measurements: ?Height, weight, and BMI. ?Blood pressure. ?Lipid and cholesterol levels. These may be checked every 5 years, or more frequently if you are over 64 years old. ?Skin check. ?Lung cancer screening. You may have this screening every year starting at age 35 if you have a 30-pack-year history of smoking and currently smoke or have quit within the past 15 years. ?Fecal occult blood test (FOBT) of the stool. You may have this test every year starting at age 58. ?Flexible sigmoidoscopy or colonoscopy. You may have a sigmoidoscopy every 5 years or a colonoscopy every 10 years starting at age 82. ?Prostate cancer screening. Recommendations will vary depending on your family history and other risks. ?Hepatitis C blood test. ?Hepatitis B blood test. ?Sexually transmitted disease (STD) testing. ?Diabetes screening. This is done by checking your blood sugar (glucose) after you have not eaten for a while (fasting). You may have this done every 1-3 years. ?Abdominal aortic aneurysm (AAA) screening. You may need this if you are a current or former smoker. ?Osteoporosis. You may be screened starting at age 21 if you are  at  high risk. ?Talk with your health care provider about your test results, treatment options, and if necessary, the need for more tests. ?Vaccines  ?Your health care provider may recommend certain vaccines, such as: ?Influenza vaccine. This is recommended every year. ?Tetanus, diphtheria, and acellular pertussis (Tdap, Td) vaccine. You may need a Td booster every 10 years. ?Zoster vaccine. You may need this after age 19. ?Pneumococcal 13-valent conjugate (PCV13) vaccine. One dose is recommended after age 24. ?Pneumococcal polysaccharide (PPSV23) vaccine. One dose is recommended after age 25. ?Talk to your health care provider about which screenings and vaccines you need and how often you need them. ?This information is not intended to replace advice given to you by your health care provider. Make sure you discuss any questions you have with your health care provider. ?Document Released: 09/14/2015 Document Revised: 05/07/2016 Document Reviewed: 06/19/2015 ?Elsevier Interactive Patient Education ? 2017 [place]. ? ?Fall Prevention in the Home ?Falls can cause injuries. They can happen to people of all ages. There are many things you can do to make your home safe and to help prevent falls. ?What can I do on the outside of my home? ?Regularly fix the edges of walkways and driveways and fix any cracks. ?Remove anything that might make you trip as you walk through a door, such as a raised step or threshold. ?Trim any bushes or trees on the path to your home. ?Use bright outdoor lighting. ?Clear any walking paths of anything that might make someone trip, such as rocks or tools. ?Regularly check to see if handrails are loose or broken. Make sure that both sides of any steps have handrails. ?Any raised decks and porches should have guardrails on the edges. ?Have any leaves, snow, or ice cleared regularly. ?Use sand or salt on walking paths during winter. ?Clean up any spills in your garage right away. This includes oil  or grease spills. ?What can I do in the bathroom? ?Use night lights. ?Install grab bars by the toilet and in the tub and shower. Do not use towel bars as grab bars. ?Use non-skid mats or decals in the tub or shower. ?If you need to sit down in the shower, use a plastic, non-slip stool. ?Keep the floor dry. Clean up any water that spills on the floor as soon as it happens. ?Remove soap buildup in the tub or shower regularly. ?Attach bath mats securely with double-sided non-slip rug tape. ?Do not have throw rugs and other things on the floor that can make you trip. ?What can I do in the bedroom? ?Use night lights. ?Make sure that you have a light by your bed that is easy to reach. ?Do not use any sheets or blankets that are too big for your bed. They should not hang down onto the floor. ?Have a firm chair that has side arms. You can use this for support while you get dressed. ?Do not have throw rugs and other things on the floor that can make you trip. ?What can I do in the kitchen? ?Clean up any spills right away. ?Avoid walking on wet floors. ?Keep items that you use a lot in easy-to-reach places. ?If you need to reach something above you, use a strong step stool that has a grab bar. ?Keep electrical cords out of the way. ?Do not use floor polish or wax that makes floors slippery. If you must use wax, use non-skid floor wax. ?Do not have throw rugs and other things on the floor that can  make you trip. ?What can I do with my stairs? ?Do not leave any items on the stairs. ?Make sure that there are handrails on both sides of the stairs and use them. Fix handrails that are broken or loose. Make sure that handrails are as long as the stairways. ?Check any carpeting to make sure that it is firmly attached to the stairs. Fix any carpet that is loose or worn. ?Avoid having throw rugs at the top or bottom of the stairs. If you do have throw rugs, attach them to the floor with carpet tape. ?Make sure that you have a light  switch at the top of the stairs and the bottom of the stairs. If you do not have them, ask someone to add them for you. ?What else can I do to help prevent falls? ?Wear shoes that: ?Do not have high heels. ?Have rub

## 2021-12-19 NOTE — Progress Notes (Signed)
? ?Subjective:  ? Andrew Foster is a 81 y.o. male who presents for Medicare Annual/Subsequent preventive examination. ? ?Review of Systems    ? ?Cardiac Risk Factors include: advanced age (>8mn, >>7women);dyslipidemia;family history of premature cardiovascular disease;male gender ? ?   ?Objective:  ?  ?Today's Vitals  ? 12/19/21 0842  ?BP: 120/60  ?Pulse: 63  ?Temp: 98 ?F (36.7 ?C)  ?SpO2: 97%  ?Weight: 179 lb 6.4 oz (81.4 kg)  ?Height: '5\' 6"'$  (1.676 m)  ?PainSc: 4   ? ?Body mass index is 28.96 kg/m?. ? ? ?  12/19/2021  ?  9:24 AM 12/18/2020  ?  9:37 AM 12/19/2019  ?  9:50 AM 12/17/2018  ?  9:19 AM 12/15/2017  ?  9:20 AM 02/04/2017  ?  1:46 PM 12/10/2016  ? 11:34 AM  ?Advanced Directives  ?Does Patient Have a Medical Advance Directive? Yes Yes Yes No Yes No No  ?Type of Advance Directive Living will;Healthcare Power of Attorney Living will;Healthcare Power of [place]  [place]    ?Does patient want to make changes to medical advance directive? No - Patient declined No - Patient declined No - Patient declined Yes (ED - Information included in AVS)   Yes (ED - Information included in AVS)  ?Copy of [place]? No - copy requested No - copy requested No - copy requested  No - copy requested    ? ? ?Current Medications (verified) ?Outpatient Encounter Medications as of 12/19/2021  ?Medication Sig  ? aspirin 81 MG tablet Take 81 mg by mouth daily.  ? diclofenac (VOLTAREN) 75 MG EC tablet Take 1 tablet (75 mg total) by mouth 2 (two) times daily.  ? glucosamine-chondroitin 500-400 MG tablet Take 0.5 tablets by mouth daily.  ? meclizine (ANTIVERT) 12.5 MG tablet Take 1 tablet (12.5 mg total) by mouth 3 (three) times daily as needed for dizziness.  ? Multiple Vitamin (MULTIVITAMIN) tablet Take 1 tablet by mouth daily.  ? omeprazole (PRILOSEC OTC) 20 MG tablet Take 20 mg by mouth daily.  ? pravastatin (PRAVACHOL) 20 MG tablet Take  1 tablet (20 mg total) by mouth daily.  ? tamsulosin (FLOMAX) 0.4 MG CAPS capsule Take 1 capsule (0.4 mg total) by mouth at bedtime.  ? traMADol (ULTRAM) 50 MG tablet Take 1 tablet (50 mg total) by mouth daily.  ? Vitamin D, Cholecalciferol, 1000 units CAPS Take by mouth.  ? ?No facility-administered encounter medications on file as of 12/19/2021.  ? ? ?Allergies (verified) ?Patient has no known allergies.  ? ?History: ?Past Medical History:  ?Diagnosis Date  ? Acute pyelonephritis 11/01/2016  ? Adenomatous polyps of colon 01/2017  ? no f/u - age  ? BPH (benign prostatic hyperplasia)   ? Diverticulosis   ? Hypertension   ? ?Past Surgical History:  ?Procedure Laterality Date  ? CARPAL TUNNEL RELEASE    ? COLONOSCOPY    ? Tics; [facility]  ? HERNIA REPAIR    ? intra articular steroids    ? R shoulder  ? PROSTATE BIOPSY    ? Dr NJanice Norrie ? VASECTOMY    ? ?Family History  ?Problem Relation Age of Onset  ? Heart attack Mother 718 ? Diabetes Mother   ? Diabetes Sister   ?     TWO sisters  ? Diabetes Brother   ?     TWO brothers  ? Diabetes type I Brother   ?  Stroke Neg Hx   ? Cancer Neg Hx   ? Colon cancer Neg Hx   ? ?Social History  ? ?Socioeconomic History  ? Marital status: Widowed  ?  Spouse name: Not on file  ? Number of children: 4  ? Years of education: Not on file  ? Highest education level: Not on file  ?Occupational History  ? Not on file  ?Tobacco Use  ? Smoking status: Former  ?  Types: Cigarettes  ?  Quit date: 09/01/1974  ?  Years since quitting: 47.3  ? Smokeless tobacco: Never  ? Tobacco comments:  ?  smoked 1956-1976, up to 3 ppd  ?Vaping Use  ? Vaping Use: Never used  ?Substance and Sexual Activity  ? Alcohol use: No  ? Drug use: No  ? Sexual activity: Not Currently  ?Other Topics Concern  ? Not on file  ?Social History Narrative  ? Not on file  ? ?Social Determinants of Health  ? ?Financial Resource Strain: Low Risk   ? Difficulty of Paying Living Expenses: Not hard at all  ?Food Insecurity: No Food Insecurity   ? Worried About Charity fundraiser in the Last Year: Never true  ? Ran Out of Food in the Last Year: Never true  ?Transportation Needs: No Transportation Needs  ? Lack of Transportation (Medical): No  ? Lack of Transportation (Non-Medical): No  ?Physical Activity: Inactive  ? Days of Exercise per Week: 0 days  ? Minutes of Exercise per Session: 0 min  ?Stress: No Stress Concern Present  ? Feeling of Stress : Not at all  ?Social Connections: Moderately Integrated  ? Frequency of Communication with Friends and Family: More than three times a week  ? Frequency of Social Gatherings with Friends and Family: More than three times a week  ? Attends Religious Services: 1 to 4 times per year  ? Active Member of Clubs or Organizations: No  ? Attends Archivist Meetings: 1 to 4 times per year  ? Marital Status: Widowed  ? ? ?Tobacco Counseling ?Counseling given: Not Answered ?Tobacco comments: smoked 1956-1976, up to 3 ppd ? ? ?Clinical Intake: ? ?Pre-visit preparation completed: Yes ? ?Pain : 0-10 ?Pain Score: 4  ?Pain Type: Chronic pain ?Pain Location: Knee ?Pain Orientation: Left ?Pain Descriptors / Indicators: Aching ?Pain Onset: More than a month ago ?Pain Frequency: Intermittent ?Effect of Pain on Daily Activities: Pain can diminish job performance, lower motivation to exercise, and prevent you from completing daily tasks. Pain produces disability and affects the quality of life. ? ?  ? ?BMI - recorded: 28.96 ?Nutritional Status: BMI 25 -29 Overweight ?Nutritional Risks: None ?Diabetes: No (Prediabetes) ?CBG done?: No ?Did pt. bring in CBG monitor from home?: No ? ?How often do you need to have someone help you when you read instructions, pamphlets, or other written materials from your doctor or pharmacy?: 1 - Never ?What is the last grade level you completed in school?: HSG ? ?Diabetic? prediabetic ? ?Interpreter Needed?: No ? ?Information entered by :: Lisette Abu, LPN ? ? ?Activities of Daily  Living ? ?  12/19/2021  ?  9:25 AM  ?In your present state of health, do you have any difficulty performing the following activities:  ?Hearing? 0  ?Vision? 0  ?Difficulty concentrating or making decisions? 0  ?Walking or climbing stairs? 1  ?Dressing or bathing? 0  ?Doing errands, shopping? 0  ?Preparing Food and eating ? N  ?Using the Toilet? N  ?In  the past six months, have you accidently leaked urine? N  ?Do you have problems with loss of bowel control? N  ?Managing your Medications? N  ?Managing your Finances? N  ?Housekeeping or managing your Housekeeping? N  ? ? ?Patient Care Team: ?Hoyt Koch, MD as PCP - General (Internal Medicine) ?Danice Goltz, MD as Consulting Physician (Ophthalmology) ?[name], [facility] as Pharmacist (Pharmacist) ? ?Indicate any recent Medical Services you may have received from other than [facility] providers in the past year (date may be approximate). ? ?   ?Assessment:  ? This is a routine wellness examination for [place]. ? ?Hearing/Vision screen ?No results found. ? ?Dietary issues and exercise activities discussed: ?Current Exercise Habits: The patient does not participate in regular exercise at present, Exercise limited by: orthopedic condition(s) ? ? Goals Addressed   ? ?  ?  ?  ?  ? This Visit's Progress  ?  Get my left knee back in shape with no pain.     ? ?  ?Depression Screen ? ?  12/19/2021  ?  8:43 AM 12/18/2020  ?  9:34 AM 12/19/2019  ?  9:50 AM 06/07/2019  ?  9:12 AM 12/17/2018  ?  9:35 AM 12/15/2017  ?  9:27 AM 12/10/2016  ? 11:33 AM  ?PHQ 2/9 Scores  ?PHQ - 2 Score 0 0 0 0 0 1 0  ?PHQ- 9 Score      2   ?  ?Fall Risk ? ?  12/19/2021  ?  8:44 AM 12/18/2020  ?  9:37 AM 12/19/2019  ?  9:50 AM 06/07/2019  ?  9:12 AM 12/17/2018  ?  9:35 AM  ?Fall Risk   ?Falls in the past year? 0 0 0 0 1  ?Number falls in past yr: 0 0 0  0  ?Injury with Fall? 0 0 0    ?Risk for fall due to : No Fall Risks No Fall Risks No Fall Risks    ?Follow up Falls evaluation completed Falls  evaluation completed Falls evaluation completed;Education provided;Falls prevention discussed  Falls prevention discussed;Education provided  ? ? ?FALL RISK PREVENTION PERTAINING TO THE HOME: ? ?Any stairs in or

## 2021-12-23 ENCOUNTER — Encounter: Payer: Self-pay | Admitting: Internal Medicine

## 2021-12-23 ENCOUNTER — Ambulatory Visit (INDEPENDENT_AMBULATORY_CARE_PROVIDER_SITE_OTHER): Payer: Medicare Other | Admitting: Internal Medicine

## 2021-12-23 ENCOUNTER — Ambulatory Visit (INDEPENDENT_AMBULATORY_CARE_PROVIDER_SITE_OTHER): Payer: Medicare Other

## 2021-12-23 VITALS — BP 118/74 | HR 69 | Resp 18 | Ht 66.0 in | Wt 179.0 lb

## 2021-12-23 DIAGNOSIS — G8929 Other chronic pain: Secondary | ICD-10-CM | POA: Diagnosis not present

## 2021-12-23 DIAGNOSIS — M25562 Pain in left knee: Secondary | ICD-10-CM

## 2021-12-23 NOTE — Progress Notes (Signed)
? ?  Subjective:  ? ?Patient ID: Andrew Foster, male    DOB: 1941-01-19, 81 y.o.   MRN: [id number] ? ?HPI ?The patient is an 81 YO man coming in for left knee problems.  ? ?Review of Systems  ?Constitutional: Negative.   ?HENT: Negative.    ?Eyes: Negative.   ?Respiratory:  Negative for cough, chest tightness and shortness of breath.   ?Cardiovascular:  Negative for chest pain, palpitations and leg swelling.  ?Gastrointestinal:  Negative for abdominal distention, abdominal pain, constipation, diarrhea, nausea and vomiting.  ?Musculoskeletal:  Positive for arthralgias and myalgias.  ?Skin: Negative.   ?Neurological: Negative.   ?Psychiatric/Behavioral: Negative.    ? ?Objective:  ?Physical Exam ?Constitutional:   ?   Appearance: He is well-developed.  ?HENT:  ?   Head: Normocephalic and atraumatic.  ?Cardiovascular:  ?   Rate and Rhythm: Normal rate and regular rhythm.  ?Pulmonary:  ?   Effort: Pulmonary effort is normal. No respiratory distress.  ?   Breath sounds: Normal breath sounds. No wheezing or rales.  ?Abdominal:  ?   General: Bowel sounds are normal. There is no distension.  ?   Palpations: Abdomen is soft.  ?   Tenderness: There is no abdominal tenderness. There is no rebound.  ?Musculoskeletal:     ?   General: Tenderness present.  ?   Cervical back: Normal range of motion.  ?   Comments: Pain left knee posterior, some effusion mild, walks with cane  ?Skin: ?   General: Skin is warm and dry.  ?Neurological:  ?   Mental Status: He is alert and oriented to person, place, and time.  ?   Coordination: Coordination normal.  ? ? ?Vitals:  ? 12/23/21 1036  ?BP: 118/74  ?Pulse: 69  ?Resp: 18  ?SpO2: 97%  ?Weight: 179 lb (81.2 kg)  ?Height: '5\' 6"'$  (1.676 m)  ? ? ?This visit occurred during the SARS-CoV-2 public health emergency.  Safety protocols were in place, including screening questions prior to the visit, additional usage of staff PPE, and extensive cleaning of exam room while observing appropriate contact time  as indicated for disinfecting solutions.  ? ?Assessment & Plan:  ? ?

## 2021-12-23 NOTE — Patient Instructions (Signed)
We will do the x-ray today and get you in with sports medicine to check the spot on the back of the knee. ?

## 2021-12-23 NOTE — Assessment & Plan Note (Addendum)
Reviewed prior CT knee 2008 with patient moderate arthritis at that time. Will check x-ray left knee today to assess progression of arthritis. Refer to sports medicine he has concern about a cyst posteriorly and is sore there on exam today. They can do [place] with/without injection during visit. Continue diclofenac 75 mg BID for pain which is effective at this time. ?

## 2021-12-24 ENCOUNTER — Encounter: Payer: Self-pay | Admitting: Family Medicine

## 2021-12-24 ENCOUNTER — Ambulatory Visit (INDEPENDENT_AMBULATORY_CARE_PROVIDER_SITE_OTHER): Payer: Medicare Other | Admitting: Family Medicine

## 2021-12-24 ENCOUNTER — Ambulatory Visit: Payer: Self-pay

## 2021-12-24 VITALS — BP 128/84 | HR 71 | Ht 66.0 in | Wt 179.4 lb

## 2021-12-24 DIAGNOSIS — M7122 Synovial cyst of popliteal space [Baker], left knee: Secondary | ICD-10-CM

## 2021-12-24 DIAGNOSIS — G8929 Other chronic pain: Secondary | ICD-10-CM | POA: Diagnosis not present

## 2021-12-24 DIAGNOSIS — M1712 Unilateral primary osteoarthritis, left knee: Secondary | ICD-10-CM | POA: Diagnosis not present

## 2021-12-24 DIAGNOSIS — M25562 Pain in left knee: Secondary | ICD-10-CM

## 2021-12-24 NOTE — Progress Notes (Signed)
? ? ?Subjective:   ? ?CC: L knee pain ? ?I, Wendy Poet, LAT, ATC, am serving as scribe for Dr. Lynne Leader. ? ?HPI: Pt is an 81 y/o male presenting w/ c/o L knee pain x approximately one year that is progressively worsening.  He locates his pain to his L medial knee.  He use to ride a motorcycle but stopped about a year ago due to his L knee pain.   ? ?L knee swelling: intermittently yes ?L knee mechanical symptoms: intermittently yes ?Aggravating factors: walking; transitioning from sitting to standing; climbing stairs  ?Treatments tried: Aleve spray ? ?Diagnostic testing: L knee XR- 12/23/21 ? ?Pertinent review of Systems: No fevers or chills ? ?Relevant historical information: History of a left knee patella dislocation decades ago. ? ? ?Objective:   ? ?Vitals:  ? 12/24/21 1020  ?BP: 128/84  ?Pulse: 71  ?SpO2: 95%  ? ?General: Well Developed, well nourished, and in no acute distress.  ? ?MSK: Left knee: Moderate effusion.   ?Normal motion with crepitation.   ?Tender palpation medial joint line. ?Palpable mass posterior medial knee. ?Intact strength. ?Stable ligamentous exam ? ? ?Lab and Radiology Results ? ?Procedure: Real-time Ultrasound Guided Injection of left knee superior lateral patellar space ?Device: Philips Affiniti 50G ?Images permanently stored and available for review in PACS ?Ultrasound evaluation prior to injection reveals moderate joint effusion.  Narrowed medial and lateral joint lines.  Moderate Baker's cyst. ?Verbal informed consent obtained.  Discussed risks and benefits of procedure. Warned about infection, bleeding, hyperglycemia damage to structures among others. ?Patient expresses understanding and agreement ?Time-out conducted.   ?Noted no overlying erythema, induration, or other signs of local infection.   ?Skin prepped in a sterile fashion.   ?Local anesthesia: Topical Ethyl chloride.   ?With sterile technique and under real time ultrasound guidance: 40 mg of Kenalog and 2 mL of  Marcaine injected into knee joint. Fluid seen entering the joint capsule.   ?Completed without difficulty   ?Pain immediately resolved suggesting accurate placement of the medication.   ?Advised to call if fevers/chills, erythema, induration, drainage, or persistent bleeding.   ?Images permanently stored and available for review in the ultrasound unit.  ?Impression: Technically successful ultrasound guided injection. ? ? ? ? ? ?No results found for this or any previous visit (from the past 72 hour(s)). ?DG Knee Complete 4 Views Left ? ?Result Date: 12/23/2021 ?CLINICAL DATA:  Left knee pain x 1 year. EXAM: LEFT KNEE - COMPLETE 4+ VIEW COMPARISON:  None. FINDINGS: Normal anatomic alignment. No evidence for acute fracture or dislocation. Tricompartmental degenerative changes. Medial compartment joint space narrowing. Regional soft tissues unremarkable. IMPRESSION: Tricompartmental degenerative changes. Electronically Signed   By: Lovey Newcomer M.D.   On: 12/23/2021 21:14   ?I, Lynne Leader, personally (independently) visualized and performed the interpretation of the images attached in this note. ? ? ? ?Impression and Recommendations:   ? ?Assessment and Plan: ?81 y.o. male with left knee pain due to exacerbation of DJD.  Patient also has a Baker's cyst which may be contributory.  Plan for intra-articular steroid injection today.  Also recommend Voltaren gel and continue quad strengthening exercises.  Recheck back as needed.  If the steroid injection does not last longer than 3 months would consider hyaluronic acid injections.  Also could consider aspiration of Baker's cyst although that does not seem to be his main pain generator today. ?Ultimately he may need a knee replacement at some point.. ? ?PDMP not reviewed this  encounter. ?Orders Placed This Encounter  ?Procedures  ? [place] LIMITED JOINT SPACE STRUCTURES LOW LEFT(NO LINKED CHARGES)  ?  Order Specific Question:   Reason for Exam (SYMPTOM  OR DIAGNOSIS REQUIRED)  ?   Answer:   L knee pain  ?  Order Specific Question:   Preferred imaging location?  ?  Answer:   [place]  ? ?No orders of the defined types were placed in this encounter. ? ? ?Discussed warning signs or symptoms. Please see discharge instructions. Patient expresses understanding. ? ? ?The above documentation has been reviewed and is accurate and complete Lynne Leader, M.D. ? ?

## 2021-12-24 NOTE — Patient Instructions (Addendum)
Nice to meet you. ? ?You had a L knee injection.  Call or go to the ER if you develop a large red swollen joint with extreme pain or oozing puss.  ? ?Please use Voltaren gel (Generic Diclofenac Gel) up to 4x daily for pain as needed.  This is available over-the-counter as both the name brand Voltaren gel and the generic diclofenac gel.  ? ?Follow-up: as needed ?

## 2021-12-28 ENCOUNTER — Other Ambulatory Visit: Payer: Self-pay | Admitting: Internal Medicine

## 2022-03-05 ENCOUNTER — Other Ambulatory Visit: Payer: Self-pay | Admitting: Internal Medicine

## 2022-04-04 DIAGNOSIS — R972 Elevated prostate specific antigen [PSA]: Secondary | ICD-10-CM | POA: Diagnosis not present

## 2022-04-04 DIAGNOSIS — R350 Frequency of micturition: Secondary | ICD-10-CM | POA: Diagnosis not present

## 2022-04-04 DIAGNOSIS — N5201 Erectile dysfunction due to arterial insufficiency: Secondary | ICD-10-CM | POA: Diagnosis not present

## 2022-04-04 DIAGNOSIS — N401 Enlarged prostate with lower urinary tract symptoms: Secondary | ICD-10-CM | POA: Diagnosis not present

## 2022-04-13 ENCOUNTER — Other Ambulatory Visit: Payer: Self-pay | Admitting: Internal Medicine

## 2022-04-14 DIAGNOSIS — Z961 Presence of intraocular lens: Secondary | ICD-10-CM | POA: Diagnosis not present

## 2022-04-16 ENCOUNTER — Telehealth: Payer: Medicare Other

## 2022-06-12 DIAGNOSIS — Z23 Encounter for immunization: Secondary | ICD-10-CM | POA: Diagnosis not present

## 2022-07-07 ENCOUNTER — Other Ambulatory Visit: Payer: Self-pay | Admitting: Internal Medicine

## 2022-07-17 ENCOUNTER — Telehealth: Payer: Medicare Other

## 2022-07-21 ENCOUNTER — Telehealth: Payer: Self-pay | Admitting: Internal Medicine

## 2022-07-21 ENCOUNTER — Other Ambulatory Visit: Payer: Self-pay | Admitting: Internal Medicine

## 2022-07-21 NOTE — Telephone Encounter (Signed)
Pt called to ask why meds refill was denied. Pt scheduled appt 07/30/22.  Patient Request REFILL:    TAMSULOSIN  PREVASTATIN DICLOFENAC  Pharmacy :   [facility] [address], [place], [place] [address] Phone: [phone]  Fax: [phone]    Patient phone number: [phone]  Last seen:  12/23/21  Next appt: 07/30/22  COMMENTS:

## 2022-07-22 ENCOUNTER — Other Ambulatory Visit: Payer: Self-pay

## 2022-07-22 MED ORDER — DICLOFENAC SODIUM 75 MG PO TBEC: 75.0000 mg | DELAYED_RELEASE_TABLET | Freq: Two times a day (BID) | ORAL | 0 refills | Status: DC

## 2022-07-22 MED ORDER — PRAVASTATIN SODIUM 20 MG PO TABS: 20.0000 mg | ORAL_TABLET | Freq: Every day | ORAL | 3 refills | Status: DC

## 2022-07-22 MED ORDER — TAMSULOSIN HCL 0.4 MG PO CAPS: 0.4000 mg | ORAL_CAPSULE | Freq: Every evening | ORAL | 3 refills | Status: DC

## 2022-07-30 ENCOUNTER — Ambulatory Visit (INDEPENDENT_AMBULATORY_CARE_PROVIDER_SITE_OTHER): Payer: Medicare Other | Admitting: Internal Medicine

## 2022-07-30 ENCOUNTER — Encounter: Payer: Self-pay | Admitting: Internal Medicine

## 2022-07-30 VITALS — BP 120/60 | HR 57 | Temp 97.6°F | Ht 66.0 in | Wt 176.0 lb

## 2022-07-30 DIAGNOSIS — M25562 Pain in left knee: Secondary | ICD-10-CM

## 2022-07-30 DIAGNOSIS — R972 Elevated prostate specific antigen [PSA]: Secondary | ICD-10-CM | POA: Diagnosis not present

## 2022-07-30 DIAGNOSIS — E782 Mixed hyperlipidemia: Secondary | ICD-10-CM | POA: Diagnosis not present

## 2022-07-30 DIAGNOSIS — R748 Abnormal levels of other serum enzymes: Secondary | ICD-10-CM

## 2022-07-30 DIAGNOSIS — Z23 Encounter for immunization: Secondary | ICD-10-CM | POA: Diagnosis not present

## 2022-07-30 DIAGNOSIS — G8929 Other chronic pain: Secondary | ICD-10-CM

## 2022-07-30 DIAGNOSIS — R7303 Prediabetes: Secondary | ICD-10-CM | POA: Diagnosis not present

## 2022-07-30 LAB — LIPID PANEL
Cholesterol: 166 mg/dL (ref 0–200)
HDL: 38.8 mg/dL — ABNORMAL LOW (ref >39.00)
NonHDL: 127.44
Total CHOL/HDL Ratio: 4
Triglycerides: 286 mg/dL — ABNORMAL HIGH (ref 0.0–149.0)
VLDL: 57.2 mg/dL — ABNORMAL HIGH (ref 0.0–40.0)

## 2022-07-30 LAB — COMPREHENSIVE METABOLIC PANEL
ALT: 29 U/L (ref 0–53)
AST: 22 U/L (ref 0–37)
Albumin: 4.6 g/dL (ref 3.5–5.2)
Alkaline Phosphatase: 55 U/L (ref 39–117)
BUN: 16 mg/dL (ref 6–23)
CO2: 31 mEq/L (ref 19–32)
Calcium: 9.7 mg/dL (ref 8.4–10.5)
Chloride: 102 mEq/L (ref 96–112)
Creatinine, Ser: 0.71 mg/dL (ref 0.40–1.50)
GFR: 85.6 mL/min (ref >60.00)
Glucose, Bld: 88 mg/dL (ref 70–99)
Potassium: 4.6 mEq/L (ref 3.5–5.1)
Sodium: 139 mEq/L (ref 135–145)
Total Bilirubin: 0.6 mg/dL (ref 0.2–1.2)
Total Protein: 7 g/dL (ref 6.0–8.3)

## 2022-07-30 LAB — CBC
HCT: 45.3 % (ref 39.0–52.0)
Hemoglobin: 15.6 g/dL (ref 13.0–17.0)
MCHC: 34.4 g/dL (ref 30.0–36.0)
MCV: 97 fl (ref 78.0–100.0)
Platelets: 267 10*3/uL (ref 150.0–400.0)
RBC: 4.67 Mil/uL (ref 4.22–5.81)
RDW: 13.1 % (ref 11.5–15.5)
WBC: 8.1 10*3/uL (ref 4.0–10.5)

## 2022-07-30 LAB — LDL CHOLESTEROL, DIRECT: Direct LDL: 105 mg/dL

## 2022-07-30 LAB — HEMOGLOBIN A1C: Hgb A1c MFr Bld: 6.4 % (ref 4.6–6.5)

## 2022-07-30 LAB — PSA: PSA: 4.91 ng/mL — ABNORMAL HIGH (ref 0.10–4.00)

## 2022-07-30 MED ORDER — DICLOFENAC SODIUM 75 MG PO TBEC: 75.0000 mg | DELAYED_RELEASE_TABLET | Freq: Two times a day (BID) | ORAL | 3 refills | Status: DC

## 2022-07-30 NOTE — Patient Instructions (Signed)
We will check the labs today and have refilled the medicines.

## 2022-07-30 NOTE — Assessment & Plan Note (Signed)
Checking CMP but stable recently. No signs of jaundice.

## 2022-07-30 NOTE — Assessment & Plan Note (Signed)
Uses tramadol daily for pain. Not due for refill today.  database reviewed and appropriate. Refill when due.

## 2022-07-30 NOTE — Assessment & Plan Note (Signed)
Checking Hga1c and adjust as needed. Counseled about diet and exercise.

## 2022-07-30 NOTE — Assessment & Plan Note (Signed)
Started pravastatin 20 mg daily and needs recheck lipid panel. Goal LDL <100. Adjust dosing as needed to meet goal.

## 2022-07-30 NOTE — Progress Notes (Signed)
   Subjective:   Patient ID: Andrew Foster, male    DOB: 12/19/1940, 81 y.o.   MRN: [id number]  HPI The patient is an 81 YO man coming in for follow up.  Review of Systems  Constitutional: Negative.   HENT: Negative.    Eyes: Negative.   Respiratory:  Negative for cough, chest tightness and shortness of breath.   Cardiovascular:  Negative for chest pain, palpitations and leg swelling.  Gastrointestinal:  Negative for abdominal distention, abdominal pain, constipation, diarrhea, nausea and vomiting.  Musculoskeletal:  Positive for arthralgias.  Skin: Negative.   Neurological: Negative.   Psychiatric/Behavioral: Negative.      Objective:  Physical Exam Constitutional:      Appearance: He is well-developed.  HENT:     Head: Normocephalic and atraumatic.  Cardiovascular:     Rate and Rhythm: Normal rate and regular rhythm.  Pulmonary:     Effort: Pulmonary effort is normal. No respiratory distress.     Breath sounds: Normal breath sounds. No wheezing or rales.  Abdominal:     General: Bowel sounds are normal. There is no distension.     Palpations: Abdomen is soft.     Tenderness: There is no abdominal tenderness. There is no rebound.  Musculoskeletal:     Cervical back: Normal range of motion.  Skin:    General: Skin is warm and dry.  Neurological:     Mental Status: He is alert and oriented to person, place, and time.     Coordination: Coordination normal.     Vitals:   07/30/22 0951  BP: 120/60  Pulse: (!) 57  Temp: 97.6 F (36.4 C)  TempSrc: Oral  SpO2: 96%  Weight: 176 lb (79.8 kg)  Height: '5\' 6"'$  (1.676 m)    Assessment & Plan:  Flu shot given at visit

## 2022-07-30 NOTE — Assessment & Plan Note (Signed)
Taking flomax 0.4 mg daily and symptoms present but tolerable. Checking PSA for stability.

## 2022-09-22 ENCOUNTER — Other Ambulatory Visit: Payer: Self-pay | Admitting: Internal Medicine

## 2022-10-15 ENCOUNTER — Other Ambulatory Visit: Payer: Self-pay

## 2022-10-15 ENCOUNTER — Inpatient Hospital Stay (HOSPITAL_COMMUNITY)
Admission: EM | Admit: 2022-10-15 | Discharge: 2022-10-18 | DRG: 872 | Disposition: A | Discharge status: Home / Self Care | Payer: Medicare Other | Attending: Family Medicine | Admitting: Family Medicine

## 2022-10-15 ENCOUNTER — Encounter (HOSPITAL_COMMUNITY): Payer: Self-pay | Admitting: Emergency Medicine

## 2022-10-15 ENCOUNTER — Emergency Department (HOSPITAL_COMMUNITY): Payer: Medicare Other

## 2022-10-15 DIAGNOSIS — B964 Proteus (mirabilis) (morganii) as the cause of diseases classified elsewhere: Secondary | ICD-10-CM | POA: Diagnosis present

## 2022-10-15 DIAGNOSIS — N400 Enlarged prostate without lower urinary tract symptoms: Secondary | ICD-10-CM | POA: Diagnosis present

## 2022-10-15 DIAGNOSIS — K4090 Unilateral inguinal hernia, without obstruction or gangrene, not specified as recurrent: Secondary | ICD-10-CM | POA: Diagnosis present

## 2022-10-15 DIAGNOSIS — N401 Enlarged prostate with lower urinary tract symptoms: Secondary | ICD-10-CM | POA: Diagnosis not present

## 2022-10-15 DIAGNOSIS — Z79899 Other long term (current) drug therapy: Secondary | ICD-10-CM

## 2022-10-15 DIAGNOSIS — J029 Acute pharyngitis, unspecified: Secondary | ICD-10-CM | POA: Diagnosis not present

## 2022-10-15 DIAGNOSIS — I10 Essential (primary) hypertension: Secondary | ICD-10-CM | POA: Diagnosis present

## 2022-10-15 DIAGNOSIS — N3000 Acute cystitis without hematuria: Secondary | ICD-10-CM | POA: Diagnosis not present

## 2022-10-15 DIAGNOSIS — K5730 Diverticulosis of large intestine without perforation or abscess without bleeding: Secondary | ICD-10-CM | POA: Diagnosis not present

## 2022-10-15 DIAGNOSIS — Z833 Family history of diabetes mellitus: Secondary | ICD-10-CM | POA: Diagnosis not present

## 2022-10-15 DIAGNOSIS — A419 Sepsis, unspecified organism: Principal | ICD-10-CM | POA: Diagnosis present

## 2022-10-15 DIAGNOSIS — R079 Chest pain, unspecified: Secondary | ICD-10-CM | POA: Diagnosis not present

## 2022-10-15 DIAGNOSIS — I959 Hypotension, unspecified: Secondary | ICD-10-CM | POA: Diagnosis present

## 2022-10-15 DIAGNOSIS — R7303 Prediabetes: Secondary | ICD-10-CM | POA: Diagnosis present

## 2022-10-15 DIAGNOSIS — Z1152 Encounter for screening for COVID-19: Secondary | ICD-10-CM

## 2022-10-15 DIAGNOSIS — K429 Umbilical hernia without obstruction or gangrene: Secondary | ICD-10-CM | POA: Diagnosis not present

## 2022-10-15 DIAGNOSIS — E782 Mixed hyperlipidemia: Secondary | ICD-10-CM | POA: Diagnosis present

## 2022-10-15 DIAGNOSIS — N390 Urinary tract infection, site not specified: Secondary | ICD-10-CM | POA: Diagnosis not present

## 2022-10-15 DIAGNOSIS — R059 Cough, unspecified: Secondary | ICD-10-CM | POA: Diagnosis not present

## 2022-10-15 DIAGNOSIS — Z8249 Family history of ischemic heart disease and other diseases of the circulatory system: Secondary | ICD-10-CM | POA: Diagnosis not present

## 2022-10-15 DIAGNOSIS — Z7982 Long term (current) use of aspirin: Secondary | ICD-10-CM

## 2022-10-15 DIAGNOSIS — N200 Calculus of kidney: Secondary | ICD-10-CM | POA: Diagnosis not present

## 2022-10-15 DIAGNOSIS — Z86010 Personal history of colonic polyps: Secondary | ICD-10-CM | POA: Diagnosis not present

## 2022-10-15 DIAGNOSIS — H9203 Otalgia, bilateral: Secondary | ICD-10-CM | POA: Diagnosis not present

## 2022-10-15 DIAGNOSIS — J069 Acute upper respiratory infection, unspecified: Secondary | ICD-10-CM | POA: Diagnosis present

## 2022-10-15 DIAGNOSIS — R3914 Feeling of incomplete bladder emptying: Secondary | ICD-10-CM | POA: Diagnosis not present

## 2022-10-15 DIAGNOSIS — K4020 Bilateral inguinal hernia, without obstruction or gangrene, not specified as recurrent: Secondary | ICD-10-CM | POA: Diagnosis not present

## 2022-10-15 DIAGNOSIS — J9811 Atelectasis: Secondary | ICD-10-CM | POA: Diagnosis present

## 2022-10-15 DIAGNOSIS — Z87891 Personal history of nicotine dependence: Secondary | ICD-10-CM

## 2022-10-15 LAB — BASIC METABOLIC PANEL
Anion gap: 11 (ref 5–15)
BUN: 18 mg/dL (ref 8–23)
CO2: 22 mmol/L (ref 22–32)
Calcium: 9 mg/dL (ref 8.9–10.3)
Chloride: 103 mmol/L (ref 98–111)
Creatinine, Ser: 0.91 mg/dL (ref 0.61–1.24)
GFR, Estimated: >60 mL/min (ref >60)
Glucose, Bld: 128 mg/dL — ABNORMAL HIGH (ref 70–99)
Potassium: 3.9 mmol/L (ref 3.5–5.1)
Sodium: 136 mmol/L (ref 135–145)

## 2022-10-15 LAB — URINALYSIS, ROUTINE W REFLEX MICROSCOPIC
Bilirubin Urine: NEGATIVE
Glucose, UA: NEGATIVE mg/dL
Hgb urine dipstick: NEGATIVE
Ketones, ur: NEGATIVE mg/dL
Nitrite: POSITIVE — AB
Protein, ur: 100 mg/dL — AB
Specific Gravity, Urine: 1.024 (ref 1.005–1.030)
WBC, UA: >50 WBC/hpf (ref 0–5)
pH: 7 (ref 5.0–8.0)

## 2022-10-15 LAB — CBC WITH DIFFERENTIAL/PLATELET
Abs Immature Granulocytes: 0.11 10*3/uL — ABNORMAL HIGH (ref 0.00–0.07)
Basophils Absolute: 0.1 10*3/uL (ref 0.0–0.1)
Basophils Relative: 0 %
Eosinophils Absolute: 0.1 10*3/uL (ref 0.0–0.5)
Eosinophils Relative: 0 %
HCT: 44.8 % (ref 39.0–52.0)
Hemoglobin: 15.1 g/dL (ref 13.0–17.0)
Immature Granulocytes: 1 %
Lymphocytes Relative: 7 %
Lymphs Abs: 1.5 10*3/uL (ref 0.7–4.0)
MCH: 32.9 pg (ref 26.0–34.0)
MCHC: 33.7 g/dL (ref 30.0–36.0)
MCV: 97.6 fL (ref 80.0–100.0)
Monocytes Absolute: 1.6 10*3/uL — ABNORMAL HIGH (ref 0.1–1.0)
Monocytes Relative: 7 %
Neutro Abs: 18.2 10*3/uL — ABNORMAL HIGH (ref 1.7–7.7)
Neutrophils Relative %: 85 %
Platelets: 268 10*3/uL (ref 150–400)
RBC: 4.59 MIL/uL (ref 4.22–5.81)
RDW: 12.4 % (ref 11.5–15.5)
WBC: 21.5 10*3/uL — ABNORMAL HIGH (ref 4.0–10.5)
nRBC: 0 % (ref 0.0–0.2)

## 2022-10-15 LAB — RESP PANEL BY RT-PCR (RSV, FLU A&B, COVID)  RVPGX2
Influenza A by PCR: NEGATIVE
Influenza B by PCR: NEGATIVE
Resp Syncytial Virus by PCR: NEGATIVE
SARS Coronavirus 2 by RT PCR: NEGATIVE

## 2022-10-15 LAB — GROUP A STREP BY PCR: Group A Strep by PCR: NOT DETECTED

## 2022-10-15 MED ORDER — SODIUM CHLORIDE 0.9 % IV SOLN
2.0000 g | INTRAVENOUS | Status: DC
  Administered 2022-10-16 – 2022-10-17 (×2): 2 g via INTRAVENOUS
  Filled 2022-10-15 (×2): qty 20

## 2022-10-15 MED ORDER — TRAMADOL HCL 50 MG PO TABS
50.0000 mg | ORAL_TABLET | Freq: Four times a day (QID) | ORAL | Status: DC | PRN
  Administered 2022-10-16 (×2): 50 mg via ORAL
  Filled 2022-10-15 (×2): qty 1

## 2022-10-15 MED ORDER — SODIUM CHLORIDE 0.9 % IV SOLN
1.0000 g | Freq: Once | INTRAVENOUS | Status: AC
  Administered 2022-10-15: 1 g via INTRAVENOUS
  Filled 2022-10-15: qty 10

## 2022-10-15 NOTE — ED Notes (Signed)
ED TO INPATIENT HANDOFF REPORT  Name/Age/Gender Andrew Foster 82 y.o. male  Code Status    Code Status Orders  (From admission, onward)           Start     Ordered   10/15/22 2302  Full code  Continuous       Question:  By:  Answer:  Consent: discussion documented in EHR   10/15/22 2302           Code Status History     Date Active Date Inactive Code Status Order ID Comments User Context   11/01/2016 1906 11/04/2016 2135 Full Code [id number]  Louellen Molder, MD Inpatient       Home/SNF/Other Home  Chief Complaint Sepsis [facility]) [A41.9]  Level of Care/Admitting Diagnosis ED Disposition     ED Disposition  Admit   Condition  --   Comment  Hospital Area: [facility] [[id number]]  Level of Care: Progressive [102]  Admit to Progressive based on following criteria: MULTISYSTEM THREATS such as stable sepsis, metabolic/electrolyte imbalance with or without encephalopathy that is responding to early treatment.  May admit patient to [facility] or [facility] if equivalent level of care is available:: No  Covid Evaluation: Asymptomatic - no recent exposure (last 10 days) testing not required  [facility]: [id number]  Admitting Physician: Toy Baker [id number]  Attending Physician: Toy Baker [id number]  Certification:: I certify this patient will need inpatient services for at least 2 midnights  Estimated Length of Stay: 2          Medical History Past Medical History:  Diagnosis Date   Acute pyelonephritis 11/01/2016   Adenomatous polyps of colon 01/2017   no f/u - age   BPH (benign prostatic hyperplasia)    Diverticulosis    Hypertension     Allergies No Known Allergies  IV Location/Drains/Wounds Patient Lines/Drains/Airways Status     Active Line/Drains/Airways     Name Placement date Placement time Site Days   Peripheral IV 10/15/22 20 G Anterior;Right Forearm 10/15/22  2100  Forearm  less than 1             Labs/Imaging Results for orders placed or performed during the hospital encounter of 10/15/22 (from the past 48 hour(s))  Resp panel by RT-PCR (RSV, Flu A&B, Covid) Anterior Nasal Swab     Status: None   Collection Time: 10/15/22  5:09 PM   Specimen: Anterior Nasal Swab  Result Value Ref Range   SARS Coronavirus 2 by RT PCR NEGATIVE NEGATIVE    Comment: (NOTE) SARS-CoV-2 target nucleic acids are NOT DETECTED.  The SARS-CoV-2 RNA is generally detectable in upper respiratory specimens during the acute phase of infection. The lowest concentration of SARS-CoV-2 viral copies this assay can detect is 138 copies/mL. A negative result does not preclude SARS-Cov-2 infection and should not be used as the sole basis for treatment or other patient management decisions. A negative result may occur with  improper specimen collection/handling, submission of specimen other than nasopharyngeal swab, presence of viral mutation(s) within the areas targeted by this assay, and inadequate number of viral copies(<138 copies/mL). A negative result must be combined with clinical observations, patient history, and epidemiological information. The expected result is Negative.  Fact Sheet for Patients:  EntrepreneurPulse.com.au  Fact Sheet for Healthcare Providers:  IncredibleEmployment.be  This test is no t yet approved or cleared by the [place] FDA and  has been authorized for detection and/or diagnosis of SARS-CoV-2 by FDA  under an Emergency Use Authorization (EUA). This EUA will remain  in effect (meaning this test can be used) for the duration of the COVID-19 declaration under Section 564(b)(1) of the Act, 21 U.S.C.section 360bbb-3(b)(1), unless the authorization is terminated  or revoked sooner.       Influenza A by PCR NEGATIVE NEGATIVE   Influenza B by PCR NEGATIVE NEGATIVE    Comment: (NOTE) The Xpert Xpress SARS-CoV-2/FLU/RSV plus assay is  intended as an aid in the diagnosis of influenza from Nasopharyngeal swab specimens and should not be used as a sole basis for treatment. Nasal washings and aspirates are unacceptable for Xpert Xpress SARS-CoV-2/FLU/RSV testing.  Fact Sheet for Patients: EntrepreneurPulse.com.au  Fact Sheet for Healthcare Providers: IncredibleEmployment.be  This test is not yet approved or cleared by the [place] FDA and has been authorized for detection and/or diagnosis of SARS-CoV-2 by FDA under an Emergency Use Authorization (EUA). This EUA will remain in effect (meaning this test can be used) for the duration of the COVID-19 declaration under Section 564(b)(1) of the Act, 21 U.S.C. section 360bbb-3(b)(1), unless the authorization is terminated or revoked.     Resp Syncytial Virus by PCR NEGATIVE NEGATIVE    Comment: (NOTE) Fact Sheet for Patients: EntrepreneurPulse.com.au  Fact Sheet for Healthcare Providers: IncredibleEmployment.be  This test is not yet approved or cleared by the [place] FDA and has been authorized for detection and/or diagnosis of SARS-CoV-2 by FDA under an Emergency Use Authorization (EUA). This EUA will remain in effect (meaning this test can be used) for the duration of the COVID-19 declaration under Section 564(b)(1) of the Act, 21 U.S.C. section 360bbb-3(b)(1), unless the authorization is terminated or revoked.  Performed at [facility] [place] [address] [place], [place] [id number]   Basic metabolic panel     Status: Abnormal   Collection Time: 10/15/22  5:27 PM  Result Value Ref Range   Sodium 136 135 - 145 mmol/L   Potassium 3.9 3.5 - 5.1 mmol/L   Chloride 103 98 - 111 mmol/L   CO2 22 22 - 32 mmol/L   Glucose, Bld 128 (H) 70 - 99 mg/dL    Comment: Glucose reference range applies only to samples taken after fasting for at least 8 hours.   BUN 18 8 - 23 mg/dL    Creatinine, Ser 0.91 0.61 - 1.24 mg/dL   Calcium 9.0 8.9 - 10.3 mg/dL   GFR, Estimated >60 >60 mL/min    Comment: (NOTE) Calculated using the CKD-EPI Creatinine Equation (2021)    Anion gap 11 5 - 15    Comment: Performed at [facility], [place] [address] [place], [place] [address]  CBC with Differential     Status: Abnormal   Collection Time: 10/15/22  5:27 PM  Result Value Ref Range   WBC 21.5 (H) 4.0 - 10.5 K/uL   RBC 4.59 4.22 - 5.81 MIL/uL   Hemoglobin 15.1 13.0 - 17.0 g/dL   HCT 44.8 39.0 - 52.0 %   MCV 97.6 80.0 - 100.0 fL   MCH 32.9 26.0 - 34.0 pg   MCHC 33.7 30.0 - 36.0 g/dL   RDW 12.4 11.5 - 15.5 %   Platelets 268 150 - 400 K/uL   nRBC 0.0 0.0 - 0.2 %   Neutrophils Relative % 85 %   Neutro Abs 18.2 (H) 1.7 - 7.7 K/uL   Lymphocytes Relative 7 %   Lymphs Abs 1.5 0.7 - 4.0 K/uL   Monocytes Relative 7 %  Monocytes Absolute 1.6 (H) 0.1 - 1.0 K/uL   Eosinophils Relative 0 %   Eosinophils Absolute 0.1 0.0 - 0.5 K/uL   Basophils Relative 0 %   Basophils Absolute 0.1 0.0 - 0.1 K/uL   Immature Granulocytes 1 %   Abs Immature Granulocytes 0.11 (H) 0.00 - 0.07 K/uL    Comment: Performed at [facility], [place] [address], [place], [place] [address]  Group A Strep by PCR     Status: None   Collection Time: 10/15/22  7:53 PM   Specimen: Throat; Sterile Swab  Result Value Ref Range   Group A Strep by PCR NOT DETECTED NOT DETECTED    Comment: Performed at [facility], [place] [address], [place], [place] [address]  Urinalysis, Routine w reflex microscopic -Urine, Clean Catch     Status: Abnormal   Collection Time: 10/15/22  7:53 PM  Result Value Ref Range   Color, Urine AMBER (A) YELLOW    Comment: BIOCHEMICALS MAY BE AFFECTED BY COLOR   APPearance CLOUDY (A) CLEAR   Specific Gravity, Urine 1.024 1.005 - 1.030   pH 7.0 5.0 - 8.0   Glucose, UA NEGATIVE NEGATIVE mg/dL   Hgb urine dipstick NEGATIVE NEGATIVE   Bilirubin  Urine NEGATIVE NEGATIVE   Ketones, ur NEGATIVE NEGATIVE mg/dL   Protein, ur 100 (A) NEGATIVE mg/dL   Nitrite POSITIVE (A) NEGATIVE   Leukocytes,Ua LARGE (A) NEGATIVE   RBC / HPF 6-10 0 - 5 RBC/hpf   WBC, UA >50 0 - 5 WBC/hpf   Bacteria, UA MANY (A) NONE SEEN   Squamous Epithelial / HPF 0-5 0 - 5 /HPF   WBC Clumps PRESENT    Mucus PRESENT    Hyaline Casts, UA PRESENT     Comment: Performed at [facility], [place] [address], [place], [place] [address]   DG Chest 2 View  Result Date: 10/15/2022 CLINICAL DATA:  Shortness of breath. Bilateral ear pain. Cough and sore throat. EXAM: CHEST - 2 VIEW COMPARISON:  11/01/2016 FINDINGS: Shallow inspiration with linear atelectasis in the lung bases. Heart size and pulmonary vascularity are normal for technique. No focal consolidations in the lungs. No pleural effusions. No pneumothorax. Mediastinal contours appear intact. Degenerative changes in the spine and shoulders. Old fracture deformity with severe degenerative changes in the right shoulder. IMPRESSION: Shallow inspiration with linear atelectasis in the lung bases. Electronically Signed   By: Lucienne Capers M.D.   On: 10/15/2022 17:41    Pending Labs Unresulted Labs (From admission, onward)     Start     Ordered   10/15/22 2303  Magnesium  Add-on,   AD        10/15/22 2302   10/15/22 2303  Phosphorus  Add-on,   AD        10/15/22 2302   10/15/22 2303  CK  Add-on,   AD        10/15/22 2302   10/15/22 2259  Comprehensive metabolic panel  Add-on,   AD        10/15/22 2258   10/15/22 2259  Procalcitonin  Add-on,   AD        10/15/22 2258   10/15/22 2258  Culture, blood (x 2)  BLOOD CULTURE X 2,   R (with STAT occurrences)     Comments: INITIATE ANTIBIOTICS WITHIN 1 HOUR AFTER BLOOD CULTURES DRAWN.  If unable to obtain blood cultures, call MD immediately regarding antibiotic instructions.    10/15/22 2258  10/15/22 2258  CBC with Differential  Add-on,   AD        10/15/22  2258   10/15/22 2258  Lactic acid, plasma  STAT Now then every 2 hours,   R (with STAT occurrences)      10/15/22 2258   10/15/22 2258  Protime-INR  ONCE - STAT,   STAT        10/15/22 2258   10/15/22 2258  APTT  ONCE - STAT,   STAT        10/15/22 2258   Signed and Held  Comprehensive metabolic panel  Tomorrow morning,   R       Question:  Release to patient  Answer:  Immediate   Signed and Held   Signed and Held  CBC  Tomorrow morning,   R       Question:  Release to patient  Answer:  Immediate   Signed and Held            Vitals/Pain Today's Vitals   10/15/22 2055 10/15/22 2100 10/15/22 2115 10/15/22 2215  BP:  103/71 100/68 116/64  Pulse:  81 78 80  Resp:  15 19 16  $ Temp: 99.4 F (37.4 C)     TempSrc: Oral     SpO2:  90% 90% 93%  Weight:      Height:      PainSc:        Isolation Precautions Airborne and Contact precautions  Medications Medications  cefTRIAXone (ROCEPHIN) 2 g in sodium chloride 0.9 % 100 mL IVPB (has no administration in time range)  traMADol (ULTRAM) tablet 50 mg (has no administration in time range)  cefTRIAXone (ROCEPHIN) 1 g in sodium chloride 0.9 % 100 mL IVPB (0 g Intravenous Stopped 10/15/22 2226)    Mobility walks with person assist

## 2022-10-15 NOTE — Assessment & Plan Note (Signed)
-  SIRS criteria met with  elevated white blood cell count,   and fever    Component Value Date/Time   WBC 21.5 (H) 10/15/2022 1727   LYMPHSABS 1.5 10/15/2022 1727        The recent clinical data is shown below. Vitals:   10/15/22 2055 10/15/22 2100 10/15/22 2115 10/15/22 2215  BP:  103/71 100/68 116/64  Pulse:  81 78 80  Resp:  '15 19 16  '$ Temp: 99.4 F (37.4 C)     TempSrc: Oral     SpO2:  90% 90% 93%  Weight:      Height:         -Most likely source being:  urinary,   - Obtain serial lactic acid and procalcitonin level.  - Initiated IV antibiotics in ER: Antibiotics Given (last 72 hours)     Date/Time Action Medication Dose Rate   10/15/22 2103 New Bag/Given   cefTRIAXone (ROCEPHIN) 1 g in sodium chloride 0.9 % 100 mL IVPB 1 g 200 mL/hr       Will continue  on : rocephin   - await results of blood and urine culture  - Rehydrate aggressively  Intravenous fluids were administered,     11:27 PM

## 2022-10-15 NOTE — H&P (Signed)
Andrew Foster [id number] DOB: 26-Oct-1940 DOA: 10/15/2022     PCP: Hoyt Koch, MD   Outpatient Specialists: * NONE CARDS: * Dr. NEphrology: *  Dr. NEurology *   Dr. Pulmonary *  Dr.  Oncology * Dr. Fabienne Bruns* Dr.  Sadie Haber, [facility]) No care team member to display Urology Dr. *  Patient arrived to ER on 10/15/22 at 1640 Referred by Attending Long, Wonda Olds, MD   Patient coming from:    home Lives alone,   *** With family    Chief Complaint:   Chief Complaint  Patient presents with   Otalgia   Cough   Sore Throat    HPI: Andrew Foster is a 82 y.o. male with medical history significant of HTn, BPH     Presented with   fatigue Patient presents with left ear pain now severe right ear pain as well dry cough several days sore throat  Denies any fever But was found to be febrile in ER  Has been feeling weak and run down  Reports dysuria   Initial COVID TEST  NEGATIVE**** POSITIVE,  ***in house  PCR testing  Pending  Lab Results  Component Value Date   [place] 10/15/2022     Regarding pertinent Chronic problems:       HTN on   ***chronic CHF diastolic/systolic/ combined - last echo***  *** CAD  - On Aspirin, statin, betablocker, Plavix                 - *followed by cardiology                - last cardiac cath  The ASCVD Risk score (Arnett DK, et al., 2019) failed to calculate for the following reasons:   The 2019 ASCVD risk score is only valid for ages 68 to 64    ***DM 2 -  Lab Results  Component Value Date   HGBA1C 6.4 07/30/2022   ****on insulin, PO meds only, diet controlled  ***Hypothyroidism:  Lab Results  Component Value Date   TSH 2.28 02/01/2015   on synthroid  *** Morbid obesity-   BMI Readings from Last 1 Encounters:  10/15/22 27.44 kg/m     *** Asthma -well *** controlled on home inhalers/ nebs f                        ***last no prior***admission  ***                       No ***history of intubation  ***  COPD - not **followed by pulmonology *** not  on baseline oxygen  *L,    *** OSA -on nocturnal oxygen, *CPAP, *noncompliant with CPAP  *** Hx of CVA - *with/out residual deficits on Aspirin 81 mg, 325, Plavix  ***A. Fib -  - CHA2DS2 vas score **** CHA2DS2/VAS Stroke Risk Points      N/A >= 2 Points: High Risk  1 - 1.99 Points: Medium Risk  0 Points: Low Risk    Last Change: N/A      This score determines the patient's risk of having a stroke if the  patient has atrial fibrillation.      This score is not applicable to this patient. Components are not  calculated.     current  on anticoagulation with ****Coumadin  ***Xarelto,* Eliquis,  *** Not on anticoagulation secondary to Risk of Falls, *** recurrent bleeding         -  Rate control:  Currently controlled with ***Toprolol,  *Metoprolol,* Diltiazem, *Coreg          - Rhythm control: *** amiodarone, *flecainide  ***Hx of DVT/PE on - anticoagulation with ****Coumadin  ***Xarelto,* Eliquis,   ***CKD stage III*- baseline Cr **** Estimated Creatinine Clearance: 61.2 mL/min (by C-G formula based on SCr of 0.91 mg/dL).  Lab Results  Component Value Date   CREATININE 0.91 10/15/2022   CREATININE 0.71 07/30/2022   CREATININE 1.11 07/31/2021     **** Liver disease Computed MELD 3.0 unavailable. Necessary lab results were not found in the last year. Computed MELD-Na unavailable. Necessary lab results were not found in the last year.    ***BPH - on Flomax, Proscar    *** Dementia - on Aricept** Nemenda  *** Chronic anemia - baseline hg Hemoglobin & Hematocrit  Recent Labs    07/30/22 1045 10/15/22 1727  HGB 15.6 15.1     While in ER:       Ordered  CT HEAD *** NON acute  CXR - ***NON acute  CTabd/pelvis - ***nonacute  CTA chest - ***nonacute, no PE, * no evidence of infiltrate  Following Medications were ordered in ER: Medications  cefTRIAXone (ROCEPHIN) 1 g in sodium chloride 0.9 % 100 mL IVPB (0 g  Intravenous Stopped 10/15/22 2226)    _______________________________________________________ ER Provider Called:     DrMarland Kitchen  They Recommend admit to medicine *** Will see in AM  ***SEEN in ER   ED Triage Vitals  Enc Vitals Group     BP 10/15/22 1655 111/60     Pulse Rate 10/15/22 1655 94     Resp 10/15/22 1655 18     Temp 10/15/22 1655 100.3 F (37.9 C)     Temp Source 10/15/22 1655 Oral     SpO2 10/15/22 1655 91 %     Weight 10/15/22 1705 170 lb (77.1 kg)     Height 10/15/22 1705 5' 6"$  (1.676 m)     Head Circumference --      Peak Flow --      Pain Score 10/15/22 1705 7     Pain Loc --      Pain Edu? --      Excl. in [place]? --   TMAX(24)@     _________________________________________ Significant initial  Findings: Abnormal Labs Reviewed  BASIC METABOLIC PANEL - Abnormal; Notable for the following components:      Result Value   Glucose, Bld 128 (*)    All other components within normal limits  CBC WITH DIFFERENTIAL/PLATELET - Abnormal; Notable for the following components:   WBC 21.5 (*)    Neutro Abs 18.2 (*)    Monocytes Absolute 1.6 (*)    Abs Immature Granulocytes 0.11 (*)    All other components within normal limits  URINALYSIS, ROUTINE W REFLEX MICROSCOPIC - Abnormal; Notable for the following components:   Color, Urine AMBER (*)    APPearance CLOUDY (*)    Protein, ur 100 (*)    Nitrite POSITIVE (*)    Leukocytes,Ua LARGE (*)    Bacteria, UA MANY (*)    All other components within normal limits      ECG: Ordered Personally reviewed and interpreted by me showing: HR : 89 Rhythm:  Sinus rhythm Probable left atrial enlargement Probable right ventricular hypertrophy  QTC 419   The recent clinical data is shown below. Vitals:   10/15/22 2055 10/15/22 2100 10/15/22 2115 10/15/22 2215  BP:  103/71 100/68 116/64  Pulse:  81 78 80  Resp:  15 19 16  $ Temp: 99.4 F (37.4 C)     TempSrc: Oral     SpO2:  90% 90% 93%  Weight:      Height:          WBC      Component Value Date/Time   WBC 21.5 (H) 10/15/2022 1727   LYMPHSABS 1.5 10/15/2022 1727   MONOABS 1.6 (H) 10/15/2022 1727   EOSABS 0.1 10/15/2022 1727   BASOSABS 0.1 10/15/2022 1727        Lactic Acid, Venous    Component Value Date/Time   LATICACIDVEN 1.88 11/01/2016 1841     Procalcitonin *** Ordered Lactic Acid, Venous    Component Value Date/Time   LATICACIDVEN 1.88 11/01/2016 1841     Procalcitonin *** Ordered   UA *** no evidence of UTI  ***Pending ***not ordered   Urine analysis:    Component Value Date/Time   COLORURINE AMBER (A) 10/15/2022 1953   APPEARANCEUR CLOUDY (A) 10/15/2022 1953   LABSPEC 1.024 10/15/2022 1953   PHURINE 7.0 10/15/2022 1953   GLUCOSEU NEGATIVE 10/15/2022 1953   GLUCOSEU NEGATIVE 10/18/2014 0902   HGBUR NEGATIVE 10/15/2022 1953   HGBUR negative 09/13/2008 1135   BILIRUBINUR NEGATIVE 10/15/2022 1953   KETONESUR NEGATIVE 10/15/2022 1953   PROTEINUR 100 (A) 10/15/2022 1953   UROBILINOGEN 0.2 10/18/2014 0902   NITRITE POSITIVE (A) 10/15/2022 1953   LEUKOCYTESUR LARGE (A) 10/15/2022 1953    Results for orders placed or performed during the hospital encounter of 10/15/22  Resp panel by RT-PCR (RSV, Flu A&B, Covid) Anterior Nasal Swab     Status: None   Collection Time: 10/15/22  5:09 PM   Specimen: Anterior Nasal Swab  Result Value Ref Range Status   SARS Coronavirus 2 by RT PCR NEGATIVE NEGATIVE Final    Comment: (NOTE) SARS-CoV-2 target nucleic acids are NOT DETECTED.  The SARS-CoV-2 RNA is generally detectable in upper respiratory specimens during the acute phase of infection. The lowest concentration of SARS-CoV-2 viral copies this assay can detect is 138 copies/mL. A negative result does not preclude SARS-Cov-2 infection and should not be used as the sole basis for treatment or other patient management decisions. A negative result may occur with  improper specimen collection/handling, submission of specimen other than  nasopharyngeal swab, presence of viral mutation(s) within the areas targeted by this assay, and inadequate number of viral copies(<138 copies/mL). A negative result must be combined with clinical observations, patient history, and epidemiological information. The expected result is Negative.  Fact Sheet for Patients:  EntrepreneurPulse.com.au  Fact Sheet for Healthcare Providers:  IncredibleEmployment.be  This test is no t yet approved or cleared by the [place] FDA and  has been authorized for detection and/or diagnosis of SARS-CoV-2 by FDA under an Emergency Use Authorization (EUA). This EUA will remain  in effect (meaning this test can be used) for the duration of the COVID-19 declaration under Section 564(b)(1) of the Act, 21 U.S.C.section 360bbb-3(b)(1), unless the authorization is terminated  or revoked sooner.       Influenza A by PCR NEGATIVE NEGATIVE Final   Influenza B by PCR NEGATIVE NEGATIVE Final    Comment: (NOTE) The Xpert Xpress SARS-CoV-2/FLU/RSV plus assay is intended as an aid in the diagnosis of influenza from Nasopharyngeal swab specimens and should not be used as a sole basis for treatment. Nasal washings and aspirates are unacceptable for Xpert Xpress SARS-CoV-2/FLU/RSV testing.  Fact Sheet for Patients: EntrepreneurPulse.com.au  Fact Sheet for Healthcare Providers: IncredibleEmployment.be  This test is not yet approved or cleared by the [place] FDA and has been authorized for detection and/or diagnosis of SARS-CoV-2 by FDA under an Emergency Use Authorization (EUA). This EUA will remain in effect (meaning this test can be used) for the duration of the COVID-19 declaration under Section 564(b)(1) of the Act, 21 U.S.C. section 360bbb-3(b)(1), unless the authorization is terminated or revoked.     Resp Syncytial Virus by PCR NEGATIVE NEGATIVE Final    Comment:  (NOTE) Fact Sheet for Patients: EntrepreneurPulse.com.au  Fact Sheet for Healthcare Providers: IncredibleEmployment.be  This test is not yet approved or cleared by the [place] FDA and has been authorized for detection and/or diagnosis of SARS-CoV-2 by FDA under an Emergency Use Authorization (EUA). This EUA will remain in effect (meaning this test can be used) for the duration of the COVID-19 declaration under Section 564(b)(1) of the Act, 21 U.S.C. section 360bbb-3(b)(1), unless the authorization is terminated or revoked.  Performed at [facility], [place] [address] [place], [place] [address]   Group A Strep by PCR     Status: None   Collection Time: 10/15/22  7:53 PM   Specimen: Throat; Sterile Swab  Result Value Ref Range Status   Group A Strep by PCR NOT DETECTED NOT DETECTED Final    Comment: Performed at [facility], [place] [address] [place], [place] [address]     _______________________________________________ Hospitalist was called for admission for *** Acute cystitis without hematuria ***    The following Work up has been ordered so far:  Orders Placed This Encounter  Procedures   Resp panel by RT-PCR (RSV, Flu A&B, Covid) Anterior Nasal Swab   Group A Strep by PCR   DG Chest 2 View   Basic metabolic panel   CBC with Differential   Urinalysis, Routine w reflex microscopic -Urine, Clean Catch   Cardiac monitoring   Fluid/PO Challenge   Consult to hospitalist   Airborne and Contact precautions   Pulse oximetry (single)   ED EKG   EKG 12-Lead   Insert peripheral IV     OTHER Significant initial  Findings:  labs showing:    Recent Labs  Lab 10/15/22 1727  NA 136  K 3.9  CO2 22  GLUCOSE 128*  BUN 18  CREATININE 0.91  CALCIUM 9.0    Cr  * stable,  Up from baseline see below Lab Results  Component Value Date   CREATININE 0.91 10/15/2022   CREATININE 0.71 07/30/2022    CREATININE 1.11 07/31/2021    No results for input(s): "AST", "ALT", "ALKPHOS", "BILITOT", "PROT", "ALBUMIN" in the last 168 hours. Lab Results  Component Value Date   CALCIUM 9.0 10/15/2022          Plt: Lab Results  Component Value Date   PLT 268 10/15/2022       COVID-19 Labs  No results for input(s): "DDIMER", "FERRITIN", "LDH", "CRP" in the last 72 hours.  Lab Results  Component Value Date   [place] NEGATIVE 10/15/2022     Arterial ***Venous  Blood Gas result:  pH *** pCO2 ***; pO2 ***;     %O2 Sat ***.  ABG    Component Value Date/Time   TCO2 25 05/13/2011 0332         Recent Labs  Lab 10/15/22 1727  WBC 21.5*  NEUTROABS 18.2*  HGB 15.1  HCT 44.8  MCV 97.6  PLT 268  HG/HCT * stable,  Down *Up from baseline see below    Component Value Date/Time   HGB 15.1 10/15/2022 1727   HCT 44.8 10/15/2022 1727   MCV 97.6 10/15/2022 1727      No results for input(s): "LIPASE", "AMYLASE" in the last 168 hours. No results for input(s): "AMMONIA" in the last 168 hours.    Cardiac Panel (last 3 results) No results for input(s): "CKTOTAL", "CKMB", "TROPONINI", "RELINDX" in the last 72 hours.  .car BNP (last 3 results) No results for input(s): "BNP" in the last 8760 hours.    DM  labs:  HbA1C: Recent Labs    07/30/22 1045  HGBA1C 6.4       CBG (last 3)  No results for input(s): "GLUCAP" in the last 72 hours.        Cultures:    Component Value Date/Time   SDES URINE, CLEAN CATCH 11/01/2016 1453   SPECREQUEST NONE 11/01/2016 1453   CULT >=100,000 COLONIES/mL KLEBSIELLA SPECIES (A) 11/01/2016 1453   REPTSTATUS 11/03/2016 FINAL 11/01/2016 1453     Radiological Exams on Admission: DG Chest 2 View  Result Date: 10/15/2022 CLINICAL DATA:  Shortness of breath. Bilateral ear pain. Cough and sore throat. EXAM: CHEST - 2 VIEW COMPARISON:  11/01/2016 FINDINGS: Shallow inspiration with linear atelectasis in the lung bases. Heart size and  pulmonary vascularity are normal for technique. No focal consolidations in the lungs. No pleural effusions. No pneumothorax. Mediastinal contours appear intact. Degenerative changes in the spine and shoulders. Old fracture deformity with severe degenerative changes in the right shoulder. IMPRESSION: Shallow inspiration with linear atelectasis in the lung bases. Electronically Signed   By: Lucienne Capers M.D.   On: 10/15/2022 17:41   _______________________________________________________________________________________________________ Latest  Blood pressure 116/64, pulse 80, temperature 99.4 F (37.4 C), temperature source Oral, resp. rate 16, height 5' 6"$  (1.676 m), weight 77.1 kg, SpO2 93 %.   Vitals  labs and radiology finding personally reviewed  Review of Systems:    Pertinent positives include: ***  Constitutional:  No weight loss, night sweats, Fevers, chills, fatigue, weight loss  HEENT:  No headaches, Difficulty swallowing,Tooth/dental problems,Sore throat,  No sneezing, itching, ear ache, nasal congestion, post nasal drip,  Cardio-vascular:  No chest pain, Orthopnea, PND, anasarca, dizziness, palpitations.no Bilateral lower extremity swelling  GI:  No heartburn, indigestion, abdominal pain, nausea, vomiting, diarrhea, change in bowel habits, loss of appetite, melena, blood in stool, hematemesis Resp:  no shortness of breath at rest. No dyspnea on exertion, No excess mucus, no productive cough, No non-productive cough, No coughing up of blood.No change in color of mucus.No wheezing. Skin:  no rash or lesions. No jaundice GU:  no dysuria, change in color of urine, no urgency or frequency. No straining to urinate.  No flank pain.  Musculoskeletal:  No joint pain or no joint swelling. No decreased range of motion. No back pain.  Psych:  No change in mood or affect. No depression or anxiety. No memory loss.  Neuro: no localizing neurological complaints, no tingling, no weakness,  no double vision, no gait abnormality, no slurred speech, no confusion  All systems reviewed and apart from [place] all are negative _______________________________________________________________________________________________ Past Medical History:   Past Medical History:  Diagnosis Date   Acute pyelonephritis 11/01/2016   Adenomatous polyps of colon 01/2017   no f/u - age   BPH (benign prostatic hyperplasia)    Diverticulosis    Hypertension       Past Surgical History:  Procedure Laterality Date   CARPAL TUNNEL RELEASE     COLONOSCOPY     Tics; [facility]   HERNIA REPAIR     intra articular steroids     R shoulder   PROSTATE BIOPSY     Dr Janice Norrie   VASECTOMY      Social History:  Ambulatory *** independently cane, walker  wheelchair bound, bed bound     reports that he quit smoking about 48 years ago. His smoking use included cigarettes. He has never used smokeless tobacco. He reports that he does not drink alcohol and does not use drugs.     Family History: *** Family History  Problem Relation Age of Onset   Heart attack Mother 65   Diabetes Mother    Diabetes Sister        TWO sisters   Diabetes Brother        TWO brothers   Diabetes type I Brother    Stroke Neg Hx    Cancer Neg Hx    Colon cancer Neg Hx    ______________________________________________________________________________________________ Allergies: No Known Allergies   Prior to Admission medications   Medication Sig Start Date End Date Taking? Authorizing Provider  aspirin 81 MG tablet Take 81 mg by mouth daily.   Yes [provider]  diclofenac (VOLTAREN) 75 MG EC tablet Take 1 tablet (75 mg total) by mouth 2 (two) times daily. Patient taking differently: Take 75 mg by mouth in the morning. 07/30/22  Yes Hoyt Koch, MD  glucosamine-chondroitin 500-400 MG tablet Take 1 tablet by mouth every other day.   Yes [provider]  meclizine (ANTIVERT) 12.5 MG tablet  Take 1 tablet (12.5 mg total) by mouth 3 (three) times daily as needed for dizziness. 10/16/20  Yes Hoyt Koch, MD  Multiple Vitamin (MULTIVITAMIN) tablet Take 1 tablet by mouth daily.   Yes [provider]  omeprazole (PRILOSEC OTC) 20 MG tablet Take 20 mg by mouth daily.   Yes [provider]  pravastatin (PRAVACHOL) 20 MG tablet Take 1 tablet (20 mg total) by mouth daily. 07/22/22  Yes Hoyt Koch, MD  tamsulosin (FLOMAX) 0.4 MG CAPS capsule Take 1 capsule (0.4 mg total) by mouth at bedtime. 07/22/22  Yes Hoyt Koch, MD  traMADol Veatrice Bourbon) 50 MG tablet Take 1 tablet by mouth once daily 09/22/22  Yes Hoyt Koch, MD  Vit B6-Vit B12-Omega 3 Acids (VITAMIN B PLUS+ PO) Take 1 tablet by mouth daily.   Yes [provider]  Vitamin D, Cholecalciferol, 1000 units CAPS Take 1 capsule by mouth daily.   Yes [provider]    ___________________________________________________________________________________________________ Physical Exam:    10/15/2022   10:15 PM 10/15/2022    9:15 PM 10/15/2022    9:00 PM  Vitals with BMI  Systolic [id number]  Diastolic 64 68 71  Pulse 80 78 81     1. General:  in No ***Acute distress***increased work of breathing ***complaining of severe pain****agitated * Chronically ill *well *cachectic *toxic acutely ill -appearing 2. Psychological: Alert and *** Oriented 3. Head/ENT:   Moist *** Dry Mucous Membranes                          Head Non traumatic, neck supple                          Normal *** Poor Dentition 4.  SKIN: normal *** decreased Skin turgor,  Skin clean Dry and intact no rash 5. Heart: Regular rate and rhythm no*** Murmur, no Rub or gallop 6. Lungs: ***Clear to auscultation bilaterally, no wheezes or crackles   7. Abdomen: Soft, ***non-tender, Non distended *** obese ***bowel sounds present 8. Lower extremities: no clubbing, cyanosis, no ***edema 9. Neurologically Grossly  intact, moving all 4 extremities equally *** strength 5 out of 5 in all 4 extremities cranial nerves II through XII intact 10. MSK: Normal range of motion    Chart has been reviewed  ______________________________________________________________________________________________  Assessment/Plan  ***  Admitted for *** Acute cystitis without hematuria ***    Present on Admission: **None**     No problem-specific Assessment & Plan notes found for this encounter.    Other plan as per orders.  DVT prophylaxis:  SCD *** Lovenox       Code Status:    Code Status: Prior FULL CODE *** DNR/DNI ***comfort care as per patient ***family  I had personally discussed CODE STATUS with patient and family* I had spent *min discussing goals of care and CODE STATUS    Family Communication:   Family not at  Bedside  plan of care was discussed on the phone with *** Son, Daughter, Wife, Husband, Sister, Brother , father, mother  Disposition Plan:   *** likely will need placement for rehabilitation                          Back to current facility when stable                            To home once workup is complete and patient is stable  ***Following barriers for discharge:                            Electrolytes corrected                               Anemia corrected                             Pain controlled with PO medications                               Afebrile, white count improving able to transition to PO antibiotics                             Will need to be able to tolerate PO                            Will likely need home health, home O2, set up                           Will need consultants to evaluate patient prior to discharge  ****EXPECT DC tomorrow                    ***Would benefit from PT/OT eval prior to DC  Ordered  Swallow eval - SLP ordered                   Diabetes care coordinator                   Transition of care consulted                    Nutrition    consulted                  Wound care  consulted                   Palliative care    consulted                   Behavioral health  consulted                    Consults called: ***    Admission status:  ED Disposition     ED Disposition  Admit   Condition  --   Comment  The patient appears reasonably stabilized for admission considering the current resources, flow, and capabilities available in the ED at this time, and I doubt any other [facility] requiring further screening and/or treatment in the ED prior to admission is  present.           Obs***  ***  inpatient     I Expect 2 midnight stay secondary to severity of patient's current illness need for inpatient interventions justified by the following: ***hemodynamic instability despite optimal treatment (tachycardia *hypotension * tachypnea *hypoxia, hypercapnia) * Severe lab/radiological/exam abnormalities including:     and extensive comorbidities including: *substance abuse  *Chronic pain *DM2  * CHF * CAD  * COPD/asthma *Morbid Obesity * CKD *dementia *liver disease *history of stroke with residual deficits *  malignancy, * sickle cell disease  History of amputation Chronic anticoagulation  That are currently affecting medical management.   I expect  patient to be hospitalized for 2 midnights requiring inpatient medical care.  Patient is at high risk for adverse outcome (such as loss of life or disability) if not treated.  Indication for inpatient stay as follows:  Severe change from baseline regarding mental status Hemodynamic instability despite maximal medical therapy,  ongoing suicidal ideations,  severe pain requiring acute inpatient management,  inability to maintain oral hydration   persistent chest pain despite medical management Need for operative/procedural  intervention New or worsening hypoxia   Need for IV antibiotics, IV fluids, IV rate controling medications, IV  antihypertensives, IV pain medications, IV anticoagulation, need for biPAP    Level of care   *** tele  For 12H 24H     medical floor       progressive tele indefinitely please discontinue once patient no longer qualifies COVID-19 Labs    Lab Results  Component Value Date   [place] NEGATIVE 10/15/2022     Precautions: admitted as *** Covid Negative  ***asymptomatic screening protocol****PUI *** covid positive Airborne and Contact precautions ***If Covid PCR is negative  - please DC precautions - would need additional investigation given very high risk for false native test result    Critical***  Patient is critically ill due to  hemodynamic instability * respiratory failure *severe sepsis* ongoing chest pain*  They are at high risk for life/limb threatening clinical deterioration requiring frequent reassessment and modifications of care.  Services provided include examination of the patient, review of relevant ancillary tests,  prescription of lifesaving therapies, review of medications and prophylactic therapy.  Total critical care time excluding separately billable procedures: 60*  Minutes.    Toy Baker 10/15/2022, 10:53 PM ***  [facility]     after 2 AM please page floor coverage PA If 7AM-7PM, please contact the day team taking care of the patient using [email or web address]   Patient was evaluated in the context of the global COVID-19 pandemic, which necessitated consideration that the patient might be at risk for infection with the SARS-CoV-2 virus that causes COVID-19. Institutional protocols and algorithms that pertain to the evaluation of patients at risk for COVID-19 are in a state of rapid change based on information released by regulatory bodies including the CDC and federal and state organizations. These policies and algorithms were followed during the patient's care.

## 2022-10-15 NOTE — ED Provider Triage Note (Signed)
Emergency Medicine Provider Triage Evaluation Note  Andrew Foster , a 82 y.o. male  was evaluated in triage.  Pt complains of cough, congestion, sore throat, and bilateral ear pain for 5 days. States he has had this before and had bad "inner ear infections" as well as a "viral infection" for which he was hospitalized several days ago. He denies fever, chills, nausea, vomiting, abdominal pain, chest pain, diarrhea, or difficulty swallowing. He states his breathing is "off" from his normal and denies a history of lung disease.   Review of Systems  Positive: See HPI Negative: See HPI  Physical Exam  BP 111/60 (BP Location: Left Arm)   Pulse 94   Temp 100.3 F (37.9 C) (Oral)   Resp 18   Ht 5' 6"$  (1.676 m)   Wt 77.1 kg   SpO2 91%   BMI 27.44 kg/m  Gen:   Awake, no distress   Resp:  Slightly labored breathing, LCTA MSK:   Moves extremities without difficulty  Other:  RRR, abdomen soft and nontender, bilateral Tms non-erythematous and non-bulging, airway patent, pharynx without erythema, exudates, or swelling  Medical Decision Making  Medically screening exam initiated at 5:17 PM.  Appropriate orders placed.  Andrew Foster was informed that the remainder of the evaluation will be completed by another provider, this initial triage assessment does not replace that evaluation, and the importance of remaining in the ED until their evaluation is complete.     Suzzette Righter, PA-C 10/15/22 1719

## 2022-10-15 NOTE — Subjective & Objective (Signed)
Patient presents with left ear pain now severe right ear pain as well dry cough several days sore throat

## 2022-10-15 NOTE — ED Provider Notes (Signed)
Emergency Department Provider Note   I have reviewed the triage vital signs and the nursing notes.   HISTORY  Chief Complaint Otalgia, Cough, and Sore Throat   HP Andrew Foster is a 82 y.o. male past history reviewed below including hypertension and BPH presents to the emergency department with intermittent right earache, sore throat, nasal congestion, cough.  Symptoms been ongoing for the past 5 days.  Denies specific chest pain or shortness of breath.  No vomiting or diarrhea.  Pain in the right ear is intermittent.  No blood or discharge from the ear.  No injury.  No severe headaches.  Had sore throat 1 to 2 days ago that seems to be improving.  He is eating and drinking without difficulty.  He also notes that his urine has "like fire" when he urinates. No urgency. No back pain. Urine symptoms began today.   Past Medical History:  Diagnosis Date   Acute pyelonephritis 11/01/2016   Adenomatous polyps of colon 01/2017   no f/u - age   BPH (benign prostatic hyperplasia)    Diverticulosis    Hypertension     Review of Systems  Constitutional: No fever/chills ENT: Positive sore throat, right ear pain, and congestion.  Cardiovascular: Denies chest pain. Respiratory: Denies shortness of breath. Gastrointestinal: No abdominal pain.  No nausea, no vomiting.  No diarrhea.  Genitourinary: Positive for dysuria. Musculoskeletal: Negative for back pain. Skin: Negative for rash. Neurological: Negative for headaches, focal weakness or numbness.   ____________________________________________   PHYSICAL EXAM:  VITAL SIGNS: ED Triage Vitals  Enc Vitals Group     BP 10/15/22 1655 111/60     Pulse Rate 10/15/22 1655 94     Resp 10/15/22 1655 18     Temp 10/15/22 1655 100.3 F (37.9 C)     Temp Source 10/15/22 1655 Oral     SpO2 10/15/22 1655 91 %     Weight 10/15/22 1705 170 lb (77.1 kg)     Height 10/15/22 1705 5' 6"$  (1.676 m)   Constitutional: Alert and oriented. Well  appearing and in no acute distress. Eyes: Conjunctivae are normal.  Head: Atraumatic. Ears:  Healthy appearing ear canals and TMs bilaterally. No mastoid tenderness.  Nose: mild congestion/rhinnorhea. Mouth/Throat: Mucous membranes are moist.  Oropharynx with mild erythema. No exudate or PTA. No trismus.  Neck: No stridor.   Cardiovascular: Normal rate, regular rhythm. Good peripheral circulation. Grossly normal heart sounds.   Respiratory: Normal respiratory effort.  No retractions. Lungs CTAB. Gastrointestinal: Soft and nontender. No distention.  Musculoskeletal: No lower extremity tenderness nor edema. No gross deformities of extremities. Neurologic:  Normal speech and language. No gross focal neurologic deficits are appreciated.  Skin:  Skin is warm, dry and intact. No rash noted.  ____________________________________________   LABS (all labs ordered are listed, but only abnormal results are displayed)  Labs Reviewed  BASIC METABOLIC PANEL - Abnormal; Notable for the following components:      Result Value   Glucose, Bld 128 (*)    All other components within normal limits  CBC WITH DIFFERENTIAL/PLATELET - Abnormal; Notable for the following components:   WBC 21.5 (*)    Neutro Abs 18.2 (*)    Monocytes Absolute 1.6 (*)    Abs Immature Granulocytes 0.11 (*)    All other components within normal limits  URINALYSIS, ROUTINE W REFLEX MICROSCOPIC - Abnormal; Notable for the following components:   Color, Urine AMBER (*)    APPearance CLOUDY (*)  Protein, ur 100 (*)    Nitrite POSITIVE (*)    Leukocytes,Ua LARGE (*)    Bacteria, UA MANY (*)    All other components within normal limits  RESP PANEL BY RT-PCR (RSV, FLU A&B, COVID)  [id number]  GROUP A STREP BY PCR  CULTURE, BLOOD (ROUTINE X 2)  CULTURE, BLOOD (ROUTINE X 2)  CBC WITH DIFFERENTIAL/PLATELET  LACTIC ACID, PLASMA  LACTIC ACID, PLASMA  PROTIME-INR  APTT  COMPREHENSIVE METABOLIC PANEL  PROCALCITONIN  MAGNESIUM   PHOSPHORUS  CK   ____________________________________________  EKG   EKG Interpretation  Date/Time:  Wednesday October 15 2022 17:21:44 EST Ventricular Rate:  89 PR Interval:  128 QRS Duration: 89 QT Interval:  344 QTC Calculation: 419 R Axis:   181 Text Interpretation: Sinus rhythm Probable left atrial enlargement Probable right ventricular hypertrophy Confirmed by Nanda Quinton [phone]) on 10/15/2022 5:26:53 PM        ____________________________________________  RADIOLOGY  DG Chest 2 View  Result Date: 10/15/2022 CLINICAL DATA:  Shortness of breath. Bilateral ear pain. Cough and sore throat. EXAM: CHEST - 2 VIEW COMPARISON:  11/01/2016 FINDINGS: Shallow inspiration with linear atelectasis in the lung bases. Heart size and pulmonary vascularity are normal for technique. No focal consolidations in the lungs. No pleural effusions. No pneumothorax. Mediastinal contours appear intact. Degenerative changes in the spine and shoulders. Old fracture deformity with severe degenerative changes in the right shoulder. IMPRESSION: Shallow inspiration with linear atelectasis in the lung bases. Electronically Signed   By: Lucienne Capers M.D.   On: 10/15/2022 17:41    ____________________________________________   PROCEDURES  Procedure(s) performed:   Procedures  None  ____________________________________________   INITIAL IMPRESSION / ASSESSMENT AND PLAN / ED COURSE  Pertinent labs & imaging results that were available during my care of the patient were reviewed by me and considered in my medical decision making (see chart for details).   This patient is Presenting for Evaluation of URI symptoms/dysuria, which does require a range of treatment options, and is a complaint that involves a high risk of morbidity and mortality.  The Differential Diagnoses include COVID, Flu, RSV, CAP, UTI, developing sepsis, mastoiditis, etc.  Critical Interventions-    Medications  cefTRIAXone  (ROCEPHIN) 2 g in sodium chloride 0.9 % 100 mL IVPB (has no administration in time range)  cefTRIAXone (ROCEPHIN) 1 g in sodium chloride 0.9 % 100 mL IVPB (0 g Intravenous Stopped 10/15/22 2226)    Reassessment after intervention:  symptoms improved.    Clinical Laboratory Tests Ordered, included CBC with significant leukocytosis to 21.5.  No anemia.  No acute kidney injury or electrolyte disturbance. Resp panel negative.   Radiologic Tests Ordered, included CXR. I independently interpreted the images and agree with radiology interpretation.   Cardiac Monitor Tracing which shows NSR.    Social Determinants of Health Risk patient is a non-smoker.   Consult complete with Hospitalist. Plan for admit. Will add on CT renal. TRH to follow.   Medical Decision Making: Summary:  Patient presents emergency department for evaluation of URI symptoms, right ear pain, and dysuria.  Overall well-appearing.  No confusion.  No back pain.  Borderline fever here along with significant leukocytosis but no other SIRS vitals at this time. Have added strep PCR and UA.   Reevaluation with update and discussion with patient. UTI noted. Plan for admit. He is in agreement.   Patient's presentation is most consistent with acute presentation with potential threat to life or bodily function.  Disposition: admit  ____________________________________________  FINAL CLINICAL IMPRESSION(S) / ED DIAGNOSES  Final diagnoses:  Acute cystitis without hematuria    Note:  This document was prepared using Dragon voice recognition software and may include unintentional dictation errors.  Nanda Quinton, MD, [facility]\   Ruvim Risko, [facility], MD 10/15/22 [phone]

## 2022-10-15 NOTE — ED Triage Notes (Signed)
Patient reports yesterday having pain in left ear and today it has moved into sharp pain in right ear. Patient reports dry cough for past several days along with sore throat.

## 2022-10-15 NOTE — ED Notes (Signed)
Patient changed into a gown and new depend on.

## 2022-10-15 NOTE — Assessment & Plan Note (Signed)
No evidence of CAP  Possibly bronchitis  Supportive management

## 2022-10-15 NOTE — ED Notes (Signed)
Patient given crackers and water

## 2022-10-15 NOTE — Assessment & Plan Note (Signed)
Continue pravachol 20 mg po q day

## 2022-10-15 NOTE — Assessment & Plan Note (Signed)
-   treat with Rocephin         await results of urine culture and adjust antibiotic coverage as needed  

## 2022-10-16 DIAGNOSIS — N401 Enlarged prostate with lower urinary tract symptoms: Secondary | ICD-10-CM

## 2022-10-16 DIAGNOSIS — R7303 Prediabetes: Secondary | ICD-10-CM | POA: Diagnosis not present

## 2022-10-16 DIAGNOSIS — R3914 Feeling of incomplete bladder emptying: Secondary | ICD-10-CM

## 2022-10-16 DIAGNOSIS — A419 Sepsis, unspecified organism: Secondary | ICD-10-CM | POA: Diagnosis not present

## 2022-10-16 DIAGNOSIS — N390 Urinary tract infection, site not specified: Secondary | ICD-10-CM | POA: Diagnosis not present

## 2022-10-16 DIAGNOSIS — E782 Mixed hyperlipidemia: Secondary | ICD-10-CM | POA: Diagnosis not present

## 2022-10-16 LAB — COMPREHENSIVE METABOLIC PANEL
ALT: 29 U/L (ref 0–44)
ALT: 30 U/L (ref 0–44)
AST: 39 U/L (ref 15–41)
AST: 39 U/L (ref 15–41)
Albumin: 3.6 g/dL (ref 3.5–5.0)
Albumin: 3.8 g/dL (ref 3.5–5.0)
Alkaline Phosphatase: 60 U/L (ref 38–126)
Alkaline Phosphatase: 64 U/L (ref 38–126)
Anion gap: 10 (ref 5–15)
Anion gap: 11 (ref 5–15)
BUN: 18 mg/dL (ref 8–23)
BUN: 19 mg/dL (ref 8–23)
CO2: 23 mmol/L (ref 22–32)
CO2: 24 mmol/L (ref 22–32)
Calcium: 8.6 mg/dL — ABNORMAL LOW (ref 8.9–10.3)
Calcium: 8.8 mg/dL — ABNORMAL LOW (ref 8.9–10.3)
Chloride: 101 mmol/L (ref 98–111)
Chloride: 102 mmol/L (ref 98–111)
Creatinine, Ser: 0.91 mg/dL (ref 0.61–1.24)
Creatinine, Ser: 1.07 mg/dL (ref 0.61–1.24)
GFR, Estimated: >60 mL/min (ref >60)
GFR, Estimated: >60 mL/min (ref >60)
Glucose, Bld: 116 mg/dL — ABNORMAL HIGH (ref 70–99)
Glucose, Bld: 176 mg/dL — ABNORMAL HIGH (ref 70–99)
Potassium: 4.1 mmol/L (ref 3.5–5.1)
Potassium: 4.4 mmol/L (ref 3.5–5.1)
Sodium: 135 mmol/L (ref 135–145)
Sodium: 136 mmol/L (ref 135–145)
Total Bilirubin: 0.6 mg/dL (ref 0.3–1.2)
Total Bilirubin: 1.2 mg/dL (ref 0.3–1.2)
Total Protein: 6.8 g/dL (ref 6.5–8.1)
Total Protein: 7.2 g/dL (ref 6.5–8.1)

## 2022-10-16 LAB — CBC WITH DIFFERENTIAL/PLATELET
Abs Immature Granulocytes: 0.19 10*3/uL — ABNORMAL HIGH (ref 0.00–0.07)
Basophils Absolute: 0.1 10*3/uL (ref 0.0–0.1)
Basophils Relative: 0 %
Eosinophils Absolute: 0 10*3/uL (ref 0.0–0.5)
Eosinophils Relative: 0 %
HCT: 41.3 % (ref 39.0–52.0)
Hemoglobin: 13.9 g/dL (ref 13.0–17.0)
Immature Granulocytes: 1 %
Lymphocytes Relative: 7 %
Lymphs Abs: 2.1 10*3/uL (ref 0.7–4.0)
MCH: 33.2 pg (ref 26.0–34.0)
MCHC: 33.7 g/dL (ref 30.0–36.0)
MCV: 98.6 fL (ref 80.0–100.0)
Monocytes Absolute: 1.9 10*3/uL — ABNORMAL HIGH (ref 0.1–1.0)
Monocytes Relative: 7 %
Neutro Abs: 24.4 10*3/uL — ABNORMAL HIGH (ref 1.7–7.7)
Neutrophils Relative %: 85 %
Platelets: 231 10*3/uL (ref 150–400)
RBC: 4.19 MIL/uL — ABNORMAL LOW (ref 4.22–5.81)
RDW: 12.7 % (ref 11.5–15.5)
WBC: 28.7 10*3/uL — ABNORMAL HIGH (ref 4.0–10.5)
nRBC: 0 % (ref 0.0–0.2)

## 2022-10-16 LAB — LACTIC ACID, PLASMA
Lactic Acid, Venous: 2.2 mmol/L (ref 0.5–1.9)
Lactic Acid, Venous: 2.3 mmol/L (ref 0.5–1.9)
Lactic Acid, Venous: 2.5 mmol/L (ref 0.5–1.9)

## 2022-10-16 LAB — CBC
HCT: 44.4 % (ref 39.0–52.0)
Hemoglobin: 15.1 g/dL (ref 13.0–17.0)
MCH: 34 pg (ref 26.0–34.0)
MCHC: 34 g/dL (ref 30.0–36.0)
MCV: 100 fL (ref 80.0–100.0)
Platelets: 242 10*3/uL (ref 150–400)
RBC: 4.44 MIL/uL (ref 4.22–5.81)
RDW: 12.8 % (ref 11.5–15.5)
WBC: 27 10*3/uL — ABNORMAL HIGH (ref 4.0–10.5)
nRBC: 0 % (ref 0.0–0.2)

## 2022-10-16 LAB — PROTIME-INR
INR: 1.2 (ref 0.8–1.2)
Prothrombin Time: 14.7 seconds (ref 11.4–15.2)

## 2022-10-16 LAB — APTT: aPTT: 33 seconds (ref 24–36)

## 2022-10-16 LAB — MAGNESIUM: Magnesium: 1.8 mg/dL (ref 1.7–2.4)

## 2022-10-16 LAB — CK: Total CK: 218 U/L (ref 49–397)

## 2022-10-16 LAB — PROCALCITONIN: Procalcitonin: 1.15 ng/mL

## 2022-10-16 LAB — PHOSPHORUS: Phosphorus: 3.5 mg/dL (ref 2.5–4.6)

## 2022-10-16 MED ORDER — PRAVASTATIN SODIUM 20 MG PO TABS
20.0000 mg | ORAL_TABLET | Freq: Every day | ORAL | Status: DC
  Administered 2022-10-16 – 2022-10-18 (×3): 20 mg via ORAL
  Filled 2022-10-16 (×3): qty 1

## 2022-10-16 MED ORDER — SODIUM CHLORIDE 0.9 % IV BOLUS
500.0000 mL | Freq: Once | INTRAVENOUS | Status: AC
  Administered 2022-10-16: 500 mL via INTRAVENOUS

## 2022-10-16 MED ORDER — SODIUM CHLORIDE 0.9 % IV BOLUS
250.0000 mL | Freq: Once | INTRAVENOUS | Status: AC
  Administered 2022-10-16: 250 mL via INTRAVENOUS

## 2022-10-16 MED ORDER — PANTOPRAZOLE SODIUM 40 MG PO TBEC
40.0000 mg | DELAYED_RELEASE_TABLET | Freq: Every day | ORAL | Status: DC
  Administered 2022-10-16 – 2022-10-18 (×3): 40 mg via ORAL
  Filled 2022-10-16 (×3): qty 1

## 2022-10-16 MED ORDER — SODIUM CHLORIDE 0.9 % IV SOLN: INTRAVENOUS | Status: AC

## 2022-10-16 MED ORDER — ALBUTEROL SULFATE (2.5 MG/3ML) 0.083% IN NEBU: 2.5000 mg | INHALATION_SOLUTION | RESPIRATORY_TRACT | Status: DC | PRN

## 2022-10-16 MED ORDER — OMEPRAZOLE MAGNESIUM 20 MG PO TBEC: 20.0000 mg | DELAYED_RELEASE_TABLET | Freq: Every day | ORAL | Status: DC

## 2022-10-16 MED ORDER — HYDROCODONE-ACETAMINOPHEN 5-325 MG PO TABS: 1.0000 | ORAL_TABLET | ORAL | Status: DC | PRN

## 2022-10-16 MED ORDER — TAMSULOSIN HCL 0.4 MG PO CAPS
0.4000 mg | ORAL_CAPSULE | Freq: Every day | ORAL | Status: DC
  Administered 2022-10-16 – 2022-10-17 (×2): 0.4 mg via ORAL
  Filled 2022-10-16 (×2): qty 1

## 2022-10-16 MED ORDER — ACETAMINOPHEN 650 MG RE SUPP: 650.0000 mg | Freq: Four times a day (QID) | RECTAL | Status: DC | PRN

## 2022-10-16 MED ORDER — ACETAMINOPHEN 325 MG PO TABS: 650.0000 mg | ORAL_TABLET | Freq: Four times a day (QID) | ORAL | Status: DC | PRN

## 2022-10-16 MED ORDER — ASPIRIN 81 MG PO TBEC
81.0000 mg | DELAYED_RELEASE_TABLET | Freq: Every day | ORAL | Status: DC
  Administered 2022-10-16 – 2022-10-18 (×3): 81 mg via ORAL
  Filled 2022-10-16 (×3): qty 1

## 2022-10-16 NOTE — ED Notes (Signed)
Lab called to add on urine culture.  ?

## 2022-10-16 NOTE — Progress Notes (Signed)
PROGRESS NOTE    Andrew Foster  [id number] DOB: [birth year] DOA: 10/15/2022 PCP: Hoyt Koch, MD   Brief Narrative: Andrew Foster is a 82 y.o. male with a history of BPH and hypertension. Patient presented secondary fatigue, cough, sore throat and dysuria. Patient found to meet criteria for sepsis with UTI as a source. Empiric Ceftriaxone started. Blood and urine cultures obtained.   Assessment and Plan:  Sepsis Present on admission. Presumed secondary to UTI. Urine and blood cultures obtained. Empiric Ceftriaxone IV started for urinary source. WBC elevated and stable. -Follow-up blood/urine cultures  UTI Associated dysuria. Urinalysis is suggestive of infection Ceftriaxone started. -Continue Ceftriaxone IV  Hyperlipidemia -Continue pravastatin  URI Symptoms improved. -Supportive care  Pre-diabetes Most recent hemoglobin A1C of 6.4%  BPH -Continue Flomax  DVT prophylaxis: SCDs Code Status:   Code Status: Full Code Family Communication: Daughter at bedside Disposition Plan: Discharge home possibly in 24 hours pending culture data   Consultants:  None  Procedures:  None  Antimicrobials: Ceftriaxone IV    Subjective: Patient reports feeling much better today. Dysuria still present but improving.  Objective: BP 129/72 (BP Location: Left Arm)   Pulse 84   Temp 99.6 F (37.6 C) (Oral)   Resp 18   Ht 5' 6"$  (1.676 m)   Wt 78.4 kg   SpO2 96%   BMI 27.91 kg/m   Examination:  General exam: Appears calm and comfortable Respiratory system: Clear to auscultation. Respiratory effort normal. Cardiovascular system: S1 & S2 heard, RRR. No murmurs. Gastrointestinal system: Abdomen is nondistended, soft and nontender. Normal bowel sounds heard. Central nervous system: Alert and oriented. No focal neurological deficits. Musculoskeletal: No edema. No calf tenderness Skin: No cyanosis. No rashes Psychiatry: Judgement and insight appear normal.  Mood & affect appropriate.    Data Reviewed: I have personally reviewed following labs and imaging studies  CBC Lab Results  Component Value Date   WBC 27.0 (H) 10/16/2022   RBC 4.44 10/16/2022   HGB 15.1 10/16/2022   HCT 44.4 10/16/2022   MCV 100.0 10/16/2022   MCH 34.0 10/16/2022   PLT 242 10/16/2022   MCHC 34.0 10/16/2022   RDW 12.8 10/16/2022   LYMPHSABS 2.1 10/16/2022   MONOABS 1.9 (H) 10/16/2022   EOSABS 0.0 10/16/2022   BASOSABS 0.1 [id number]     Last metabolic panel Lab Results  Component Value Date   NA 135 10/16/2022   K 4.4 10/16/2022   CL 101 10/16/2022   CO2 23 10/16/2022   BUN 19 10/16/2022   CREATININE 0.91 10/16/2022   GLUCOSE 116 (H) 10/16/2022   GFRNONAA >60 10/16/2022   GFRAA >60 11/04/2016   CALCIUM 8.8 (L) 10/16/2022   PHOS 3.5 10/16/2022   PROT 7.2 10/16/2022   ALBUMIN 3.8 10/16/2022   BILITOT 1.2 10/16/2022   ALKPHOS 64 10/16/2022   AST 39 10/16/2022   ALT 30 10/16/2022   ANIONGAP 11 10/16/2022    GFR: Estimated Creatinine Clearance: 61.6 mL/min (by C-G formula based on SCr of 0.91 mg/dL).  Recent Results (from the past 240 hour(s))  Resp panel by RT-PCR (RSV, Flu A&B, Covid) Anterior Nasal Swab     Status: None   Collection Time: 10/15/22  5:09 PM   Specimen: Anterior Nasal Swab  Result Value Ref Range Status   SARS Coronavirus 2 by RT PCR NEGATIVE NEGATIVE Final    Comment: (NOTE) SARS-CoV-2 target nucleic acids are NOT DETECTED.  The SARS-CoV-2 RNA is generally detectable in upper respiratory  specimens during the acute phase of infection. The lowest concentration of SARS-CoV-2 viral copies this assay can detect is 138 copies/mL. A negative result does not preclude SARS-Cov-2 infection and should not be used as the sole basis for treatment or other patient management decisions. A negative result may occur with  improper specimen collection/handling, submission of specimen other than nasopharyngeal swab, presence of viral  mutation(s) within the areas targeted by this assay, and inadequate number of viral copies(<138 copies/mL). A negative result must be combined with clinical observations, patient history, and epidemiological information. The expected result is Negative.  Fact Sheet for Patients:  EntrepreneurPulse.com.au  Fact Sheet for Healthcare Providers:  IncredibleEmployment.be  This test is no t yet approved or cleared by the [place] FDA and  has been authorized for detection and/or diagnosis of SARS-CoV-2 by FDA under an Emergency Use Authorization (EUA). This EUA will remain  in effect (meaning this test can be used) for the duration of the COVID-19 declaration under Section 564(b)(1) of the Act, 21 U.S.C.section 360bbb-3(b)(1), unless the authorization is terminated  or revoked sooner.       Influenza A by PCR NEGATIVE NEGATIVE Final   Influenza B by PCR NEGATIVE NEGATIVE Final    Comment: (NOTE) The Xpert Xpress SARS-CoV-2/FLU/RSV plus assay is intended as an aid in the diagnosis of influenza from Nasopharyngeal swab specimens and should not be used as a sole basis for treatment. Nasal washings and aspirates are unacceptable for Xpert Xpress SARS-CoV-2/FLU/RSV testing.  Fact Sheet for Patients: EntrepreneurPulse.com.au  Fact Sheet for Healthcare Providers: IncredibleEmployment.be  This test is not yet approved or cleared by the [place] FDA and has been authorized for detection and/or diagnosis of SARS-CoV-2 by FDA under an Emergency Use Authorization (EUA). This EUA will remain in effect (meaning this test can be used) for the duration of the COVID-19 declaration under Section 564(b)(1) of the Act, 21 U.S.C. section 360bbb-3(b)(1), unless the authorization is terminated or revoked.     Resp Syncytial Virus by PCR NEGATIVE NEGATIVE Final    Comment: (NOTE) Fact Sheet for  Patients: EntrepreneurPulse.com.au  Fact Sheet for Healthcare Providers: IncredibleEmployment.be  This test is not yet approved or cleared by the [place] FDA and has been authorized for detection and/or diagnosis of SARS-CoV-2 by FDA under an Emergency Use Authorization (EUA). This EUA will remain in effect (meaning this test can be used) for the duration of the COVID-19 declaration under Section 564(b)(1) of the Act, 21 U.S.C. section 360bbb-3(b)(1), unless the authorization is terminated or revoked.  Performed at [facility], [place] [address], [place], [place] [address]   Group A Strep by PCR     Status: None   Collection Time: 10/15/22  7:53 PM   Specimen: Throat; Sterile Swab  Result Value Ref Range Status   Group A Strep by PCR NOT DETECTED NOT DETECTED Final    Comment: Performed at [facility], [place] [address], [place], [place] [address]      Radiology Studies: CT Renal Stone Study  Result Date: 10/16/2022 CLINICAL DATA:  Diffuse abdominal pain EXAM: CT ABDOMEN AND PELVIS WITHOUT CONTRAST TECHNIQUE: Multidetector CT imaging of the abdomen and pelvis was performed following the standard protocol without IV contrast. RADIATION DOSE REDUCTION: This exam was performed according to the departmental dose-optimization program which includes automated exposure control, adjustment of the mA and/or kV according to patient size and/or use of iterative reconstruction technique. COMPARISON:  CT 11/01/2016 FINDINGS: Lower chest: Lung bases  demonstrate no acute airspace disease. Atelectasis or scarring at the bases. Coronary vascular calcification. Hepatobiliary: Hepatic steatosis. No calcified gallstone or biliary dilatation Pancreas: Unremarkable. No pancreatic ductal dilatation or surrounding inflammatory changes. Spleen: Normal in size without focal abnormality. Adrenals/Urinary Tract: Adrenal glands are normal.  Kidneys show no hydronephrosis. Punctate nonobstructing stone lower pole left kidney. The bladder is unremarkable. Stomach/Bowel: The stomach is nonenlarged. No dilated small bowel. No acute bowel wall thickening. Negative appendix. Diverticular disease of the sigmoid colon without acute wall thickening. Vascular/Lymphatic: Advanced aortic atherosclerosis. No aneurysm. No suspicious lymph nodes. Reproductive: Enlarged prostate with mass effect on the bladder Other: Negative for pelvic effusion or free air. Bilateral inguinal hernias left greater than right. Sigmoid colon diverticulum extends into left inguinal hernia but no convincing inflammation. Small periumbilical fat containing hernia. Musculoskeletal: No acute osseous abnormality. Grade 1 anterolisthesis L4 on L5. Mild retrolisthesis L1 on L2. Advanced multilevel degenerative changes. IMPRESSION: 1. No CT evidence for acute intra-abdominal or pelvic abnormality. 2. Hepatic steatosis. 3. Nonobstructing left kidney stone. 4. Diverticular disease of the sigmoid colon without acute inflammatory process. 5. Left greater than right mostly fat containing inguinal hernias. Hernia on the left contains a sigmoid colon diverticulum but there are no inflammatory changes 6. Aortic atherosclerosis. Aortic Atherosclerosis (ICD10-I70.0). Electronically Signed   By: Donavan Foil M.D.   On: 10/16/2022 00:00   DG Chest 2 View  Result Date: 10/15/2022 CLINICAL DATA:  Shortness of breath. Bilateral ear pain. Cough and sore throat. EXAM: CHEST - 2 VIEW COMPARISON:  11/01/2016 FINDINGS: Shallow inspiration with linear atelectasis in the lung bases. Heart size and pulmonary vascularity are normal for technique. No focal consolidations in the lungs. No pleural effusions. No pneumothorax. Mediastinal contours appear intact. Degenerative changes in the spine and shoulders. Old fracture deformity with severe degenerative changes in the right shoulder. IMPRESSION: Shallow inspiration  with linear atelectasis in the lung bases. Electronically Signed   By: Lucienne Capers M.D.   On: 10/15/2022 17:41      LOS: 1 day    Cordelia Poche, MD [facility] 10/16/2022, 5:05 PM   If 7PM-7AM, please contact night-coverage [email or web address]

## 2022-10-16 NOTE — Assessment & Plan Note (Signed)
Diet controlled.  

## 2022-10-16 NOTE — Plan of Care (Signed)
  Problem: Fluid Volume: Goal: Hemodynamic stability will improve Outcome: Progressing   Problem: Respiratory: Goal: Ability to maintain adequate ventilation will improve Outcome: Progressing   Problem: Education: Goal: Knowledge of General Education information will improve Description: Including pain rating scale, medication(s)/side effects and non-pharmacologic comfort measures Outcome: Progressing   Problem: Health Behavior/Discharge Planning: Goal: Ability to manage health-related needs will improve Outcome: Progressing   Problem: Clinical Measurements: Goal: Ability to maintain clinical measurements within normal limits will improve Outcome: Progressing   Problem: Activity: Goal: Risk for activity intolerance will decrease Outcome: Progressing   Problem: Elimination: Goal: Will not experience complications related to urinary retention Outcome: Progressing   Problem: Safety: Goal: Ability to remain free from injury will improve Outcome: Progressing

## 2022-10-16 NOTE — Hospital Course (Signed)
Andrew Foster is a 82 y.o. male with a history of BPH and hypertension. Patient presented secondary fatigue, cough, sore throat and dysuria. Patient found to meet criteria for sepsis with UTI as a source. Empiric Ceftriaxone started. Blood and urine cultures obtained.

## 2022-10-16 NOTE — Evaluation (Signed)
Physical Therapy Evaluation Patient Details Name: Andrew Foster MRN: [id number] DOB: 06-13-1941 Today's Date: 10/16/2022  History of Present Illness  Pt is 82 yo male admitted 10/15/22 with sepsis due to UTI.  Pt with history including HTN and BPH  Clinical Impression  Pt admitted with above diagnosis.  He was able to transfer and ambulate 200' safely using his cane with supervision for lines.  Pt with good strength and balance with VSS.  Reports feeling much better than yesterday and feels near baseline.  No further acute PT needs.        Recommendations for follow up therapy are one component of a multi-disciplinary discharge planning process, led by the attending physician.  Recommendations may be updated based on patient status, additional functional criteria and insurance authorization.  Follow Up Recommendations No PT follow up      Assistance Recommended at Discharge PRN  Patient can return home with the following       Equipment Recommendations None recommended by PT  Recommendations for Other Services       Functional Status Assessment Patient has not had a recent decline in their functional status     Precautions / Restrictions Precautions Precautions: Fall      Mobility  Bed Mobility Overal bed mobility: Modified Independent                  Transfers Overall transfer level: Needs assistance Equipment used: None, Straight cane Transfers: Sit to/from Stand Sit to Stand: Supervision           General transfer comment: performed x 2 -once without cane and once with    Ambulation/Gait Ambulation/Gait assistance: Min guard, Supervision Gait Distance (Feet): 200 Feet Assistive device: Straight cane Gait Pattern/deviations: Step-through pattern       General Gait Details: steady gait with cane; decreased speed but reports that is his normal; min guard progressing to supervision; reports feels near baseline  Stairs            Wheelchair  Mobility    Modified Rankin (Stroke Patients Only)       Balance Overall balance assessment: Needs assistance   Sitting balance-Leahy Scale: Normal     Standing balance support: No upper extremity supported, Single extremity supported Standing balance-Leahy Scale: Fair                               Pertinent Vitals/Pain Pain Assessment Pain Assessment: No/denies pain    Home Living Family/patient expects to be discharged to:: Private residence Living Arrangements: Alone Available Help at Discharge: Family;Available PRN/intermittently (son and sister live nextdoor) Type of Home: House Home Access: Level entry       Home Layout: One level Home Equipment: Toilet riser;Cane - single point      Prior Function Prior Level of Function : Independent/Modified Independent;Driving             Mobility Comments: Could ambulate in community; uses a cane when walking outside; walks his dogs ADLs Comments: independent with adls and iadls     Hand Dominance        Extremity/Trunk Assessment   Upper Extremity Assessment Upper Extremity Assessment: Overall WFL for tasks assessed (ROM WFL ; MMT 5/5)    Lower Extremity Assessment Lower Extremity Assessment: Overall WFL for tasks assessed (ROM WFL; MMT 5/5)    Cervical / Trunk Assessment Cervical / Trunk Assessment: Normal  Communication   Communication: No difficulties  Cognition Arousal/Alertness: Awake/alert Behavior During Therapy: WFL for tasks assessed/performed Overall Cognitive Status: Within Functional Limits for tasks assessed                                          General Comments General comments (skin integrity, edema, etc.): VSS    Exercises     Assessment/Plan    PT Assessment Patient does not need any further PT services  PT Problem List         PT Treatment Interventions      PT Goals (Current goals can be found in the Care Plan section)  Acute Rehab PT  Goals Patient Stated Goal: return home PT Goal Formulation: All assessment and education complete, DC therapy    Frequency       Co-evaluation               AM-PAC PT "6 Clicks" Mobility  Outcome Measure Help needed turning from your back to your side while in a flat bed without using bedrails?: None Help needed moving from lying on your back to sitting on the side of a flat bed without using bedrails?: None Help needed moving to and from a bed to a chair (including a wheelchair)?: A Little Help needed standing up from a chair using your arms (e.g., wheelchair or bedside chair)?: A Little Help needed to walk in hospital room?: A Little Help needed climbing 3-5 steps with a railing? : A Little 6 Click Score: 20    End of Session Equipment Utilized During Treatment: Gait belt Activity Tolerance: Patient tolerated treatment well Patient left: in bed;with call bell/phone within reach;with bed alarm set;with family/visitor present Nurse Communication: Mobility status PT Visit Diagnosis: Other abnormalities of gait and mobility (R26.89)    Time: [id number] PT Time Calculation (min) (ACUTE ONLY): 17 min   Charges:   PT Evaluation $PT Eval Low Complexity: 1 Low          Harrell Niehoff, PT Acute Rehab [facility] [phone]   Karlton Lemon 10/16/2022, 12:47 PM

## 2022-10-16 NOTE — Progress Notes (Signed)
OT Cancellation Note  Patient Details Name: Andrew Foster MRN: [id number] DOB: 01/12/1941   Cancelled Treatment:    Reason Eval/Treat Not Completed: OT screened, no needs identified, will sign off. Patient has no OT needs.  Andrew Foster Andrew Foster 10/16/2022, 10:19 AM

## 2022-10-17 DIAGNOSIS — R7303 Prediabetes: Secondary | ICD-10-CM | POA: Diagnosis not present

## 2022-10-17 DIAGNOSIS — E782 Mixed hyperlipidemia: Secondary | ICD-10-CM | POA: Diagnosis not present

## 2022-10-17 DIAGNOSIS — N390 Urinary tract infection, site not specified: Secondary | ICD-10-CM | POA: Diagnosis not present

## 2022-10-17 DIAGNOSIS — A419 Sepsis, unspecified organism: Secondary | ICD-10-CM | POA: Diagnosis not present

## 2022-10-17 LAB — CBC
HCT: 39.7 % (ref 39.0–52.0)
Hemoglobin: 13.4 g/dL (ref 13.0–17.0)
MCH: 32.9 pg (ref 26.0–34.0)
MCHC: 33.8 g/dL (ref 30.0–36.0)
MCV: 97.5 fL (ref 80.0–100.0)
Platelets: 203 10*3/uL (ref 150–400)
RBC: 4.07 MIL/uL — ABNORMAL LOW (ref 4.22–5.81)
RDW: 12.8 % (ref 11.5–15.5)
WBC: 24.9 10*3/uL — ABNORMAL HIGH (ref 4.0–10.5)
nRBC: 0 % (ref 0.0–0.2)

## 2022-10-17 LAB — PROCALCITONIN: Procalcitonin: 4.54 ng/mL

## 2022-10-17 NOTE — Progress Notes (Signed)
.   Transition of Care [facility]) Screening Note   Patient Details  Name: Andrew Foster Date of Birth: [birth year]   Transition of Care [facility]) CM/SW Contact:    Illene Regulus, LCSW Phone Number: 10/17/2022, 9:43 AM    Transition of Care Department [facility]) has reviewed patient and no TOC needs have been identified at this time. We will continue to monitor patient advancement through interdisciplinary progression rounds. If new patient transition needs arise, please place a TOC consult.

## 2022-10-17 NOTE — Progress Notes (Signed)
PROGRESS NOTE    Andrew Foster  [id number] DOB: [birth year] DOA: 10/15/2022 PCP: Hoyt Koch, MD   Brief Narrative: Andrew Foster is a 82 y.o. male with a history of BPH and hypertension. Patient presented secondary fatigue, cough, sore throat and dysuria. Patient found to meet criteria for sepsis with UTI as a source. Empiric Ceftriaxone started. Blood and urine cultures obtained.   Assessment and Plan:  Sepsis Present on admission. Presumed secondary to UTI. Urine and blood cultures obtained. Empiric Ceftriaxone IV started for urinary source. WBC elevated and improving. Urine culture significant for Proteus mirabilis.procalcitonin up slightly. -Follow-up blood/urine cultures -Continue antibiotics  UTI Associated dysuria. Urinalysis is suggestive of infection Ceftriaxone started. Urine culture significant for proteus mirabilis. -Continue Ceftriaxone IV -Follow-up urine culture sensitivities  Hyperlipidemia -Continue pravastatin  URI Symptoms improved. -Supportive care  Pre-diabetes Most recent hemoglobin A1C of 6.4%  BPH -Continue Flomax  DVT prophylaxis: SCDs Code Status:   Code Status: Full Code Family Communication: None at bedside Disposition Plan: Discharge home possibly in 1-2 days pending culture data and ability to transition to oral antibiotics   Consultants:  None  Procedures:  None  Antimicrobials: Ceftriaxone IV    Subjective: Patient reports continued dysuria but otherwise feeling alright.  Objective: BP 112/67 (BP Location: Left Arm)   Pulse 72   Temp 99.2 F (37.3 C) (Oral)   Resp 20   Ht 5' 6"$  (1.676 m)   Wt 78.4 kg   SpO2 94%   BMI 27.91 kg/m   Examination:  General exam: Appears calm and comfortable. Sitting up in the chair. Respiratory system: Clear to auscultation. Respiratory effort normal. Cardiovascular system: S1 & S2 heard, RRR. Gastrointestinal system: Abdomen is nondistended, soft and nontender.  Normal bowel sounds heard. Central nervous system: Alert and oriented. No focal neurological deficits. Musculoskeletal: No edema. No calf tenderness Psychiatry: Judgement and insight appear normal. Mood & affect appropriate.    Data Reviewed: I have personally reviewed following labs and imaging studies  CBC Lab Results  Component Value Date   WBC 24.9 (H) 10/17/2022   RBC 4.07 (L) 10/17/2022   HGB 13.4 10/17/2022   HCT 39.7 10/17/2022   MCV 97.5 10/17/2022   MCH 32.9 10/17/2022   PLT 203 10/17/2022   MCHC 33.8 10/17/2022   RDW 12.8 10/17/2022   LYMPHSABS 2.1 10/16/2022   MONOABS 1.9 (H) 10/16/2022   EOSABS 0.0 10/16/2022   BASOSABS 0.1 [id number]     Last metabolic panel Lab Results  Component Value Date   NA 135 10/16/2022   K 4.4 10/16/2022   CL 101 10/16/2022   CO2 23 10/16/2022   BUN 19 10/16/2022   CREATININE 0.91 10/16/2022   GLUCOSE 116 (H) 10/16/2022   GFRNONAA >60 10/16/2022   GFRAA >60 11/04/2016   CALCIUM 8.8 (L) 10/16/2022   PHOS 3.5 10/16/2022   PROT 7.2 10/16/2022   ALBUMIN 3.8 10/16/2022   BILITOT 1.2 10/16/2022   ALKPHOS 64 10/16/2022   AST 39 10/16/2022   ALT 30 10/16/2022   ANIONGAP 11 10/16/2022    GFR: Estimated Creatinine Clearance: 61.6 mL/min (by C-G formula based on SCr of 0.91 mg/dL).  Recent Results (from the past 240 hour(s))  Resp panel by RT-PCR (RSV, Flu A&B, Covid) Anterior Nasal Swab     Status: None   Collection Time: 10/15/22  5:09 PM   Specimen: Anterior Nasal Swab  Result Value Ref Range Status   SARS Coronavirus 2 by RT PCR NEGATIVE NEGATIVE  Final    Comment: (NOTE) SARS-CoV-2 target nucleic acids are NOT DETECTED.  The SARS-CoV-2 RNA is generally detectable in upper respiratory specimens during the acute phase of infection. The lowest concentration of SARS-CoV-2 viral copies this assay can detect is 138 copies/mL. A negative result does not preclude SARS-Cov-2 infection and should not be used as the sole basis  for treatment or other patient management decisions. A negative result may occur with  improper specimen collection/handling, submission of specimen other than nasopharyngeal swab, presence of viral mutation(s) within the areas targeted by this assay, and inadequate number of viral copies(<138 copies/mL). A negative result must be combined with clinical observations, patient history, and epidemiological information. The expected result is Negative.  Fact Sheet for Patients:  EntrepreneurPulse.com.au  Fact Sheet for Healthcare Providers:  IncredibleEmployment.be  This test is no t yet approved or cleared by the [place] FDA and  has been authorized for detection and/or diagnosis of SARS-CoV-2 by FDA under an Emergency Use Authorization (EUA). This EUA will remain  in effect (meaning this test can be used) for the duration of the COVID-19 declaration under Section 564(b)(1) of the Act, 21 U.S.C.section 360bbb-3(b)(1), unless the authorization is terminated  or revoked sooner.       Influenza A by PCR NEGATIVE NEGATIVE Final   Influenza B by PCR NEGATIVE NEGATIVE Final    Comment: (NOTE) The Xpert Xpress SARS-CoV-2/FLU/RSV plus assay is intended as an aid in the diagnosis of influenza from Nasopharyngeal swab specimens and should not be used as a sole basis for treatment. Nasal washings and aspirates are unacceptable for Xpert Xpress SARS-CoV-2/FLU/RSV testing.  Fact Sheet for Patients: EntrepreneurPulse.com.au  Fact Sheet for Healthcare Providers: IncredibleEmployment.be  This test is not yet approved or cleared by the [place] FDA and has been authorized for detection and/or diagnosis of SARS-CoV-2 by FDA under an Emergency Use Authorization (EUA). This EUA will remain in effect (meaning this test can be used) for the duration of the COVID-19 declaration under Section 564(b)(1) of the Act, 21  U.S.C. section 360bbb-3(b)(1), unless the authorization is terminated or revoked.     Resp Syncytial Virus by PCR NEGATIVE NEGATIVE Final    Comment: (NOTE) Fact Sheet for Patients: EntrepreneurPulse.com.au  Fact Sheet for Healthcare Providers: IncredibleEmployment.be  This test is not yet approved or cleared by the [place] FDA and has been authorized for detection and/or diagnosis of SARS-CoV-2 by FDA under an Emergency Use Authorization (EUA). This EUA will remain in effect (meaning this test can be used) for the duration of the COVID-19 declaration under Section 564(b)(1) of the Act, 21 U.S.C. section 360bbb-3(b)(1), unless the authorization is terminated or revoked.  Performed at [facility], [place] [address] [place], [place] [address]   Group A Strep by PCR     Status: None   Collection Time: 10/15/22  7:53 PM   Specimen: Throat; Sterile Swab  Result Value Ref Range Status   Group A Strep by PCR NOT DETECTED NOT DETECTED Final    Comment: Performed at [facility], [place] [address], [place], [place] [address]  Culture, blood (x 2)     Status: None (Preliminary result)   Collection Time: 10/15/22 10:15 PM   Specimen: BLOOD LEFT FOREARM  Result Value Ref Range Status   Specimen Description   Final    BLOOD LEFT FOREARM Performed at [place] [address] [place],  [address]    Special Requests   Final  BOTTLES DRAWN AEROBIC AND ANAEROBIC Blood Culture adequate volume Performed at [place] [address], [place], [place] [address]    Culture   Final    NO GROWTH 1 DAY Performed at [facility], [place] [address], [place], [place] [address]    Report Status PENDING  Incomplete  Culture, blood (x 2)     Status: None (Preliminary result)   Collection Time: 10/15/22 11:23 PM   Specimen: BLOOD  Result Value Ref Range Status    Specimen Description   Final    BLOOD RIGHT ANTECUBITAL Performed at [place] [address], [place], [place] [address]    Special Requests   Final    BOTTLES DRAWN AEROBIC AND ANAEROBIC Blood Culture adequate volume Performed at [place] [address] [place], [place] [address]    Culture   Final    NO GROWTH 1 DAY Performed at [facility], [place] [address] [place], [place] [address]    Report Status PENDING  Incomplete  Urine Culture (for pregnant, neutropenic or urologic patients or patients with an indwelling urinary catheter)     Status: Abnormal (Preliminary result)   Collection Time: 10/16/22 12:11 AM   Specimen: Urine, Clean Catch  Result Value Ref Range Status   Specimen Description   Final    URINE, CLEAN CATCH Performed at [facility], [place] [address], [place], [place] [address]    Special Requests   Final    NONE Performed at [facility], [place] [address] [place], [place] [address]    Culture (A)  Final    >=100,000 COLONIES/mL PROTEUS MIRABILIS SUSCEPTIBILITIES TO FOLLOW Performed at [facility], [place] [address], [place], [place] [address]    Report Status PENDING  Incomplete      Radiology Studies: CT Renal Stone Study  Result Date: 10/16/2022 CLINICAL DATA:  Diffuse abdominal pain EXAM: CT ABDOMEN AND PELVIS WITHOUT CONTRAST TECHNIQUE: Multidetector CT imaging of the abdomen and pelvis was performed following the standard protocol without IV contrast. RADIATION DOSE REDUCTION: This exam was performed according to the departmental dose-optimization program which includes automated exposure control, adjustment of the mA and/or kV according to patient size and/or use of iterative reconstruction technique. COMPARISON:  CT 11/01/2016 FINDINGS: Lower chest: Lung bases demonstrate no acute airspace disease. Atelectasis or scarring at the bases. Coronary vascular  calcification. Hepatobiliary: Hepatic steatosis. No calcified gallstone or biliary dilatation Pancreas: Unremarkable. No pancreatic ductal dilatation or surrounding inflammatory changes. Spleen: Normal in size without focal abnormality. Adrenals/Urinary Tract: Adrenal glands are normal. Kidneys show no hydronephrosis. Punctate nonobstructing stone lower pole left kidney. The bladder is unremarkable. Stomach/Bowel: The stomach is nonenlarged. No dilated small bowel. No acute bowel wall thickening. Negative appendix. Diverticular disease of the sigmoid colon without acute wall thickening. Vascular/Lymphatic: Advanced aortic atherosclerosis. No aneurysm. No suspicious lymph nodes. Reproductive: Enlarged prostate with mass effect on the bladder Other: Negative for pelvic effusion or free air. Bilateral inguinal hernias left greater than right. Sigmoid colon diverticulum extends into left inguinal hernia but no convincing inflammation. Small periumbilical fat containing hernia. Musculoskeletal: No acute osseous abnormality. Grade 1 anterolisthesis L4 on L5. Mild retrolisthesis L1 on L2. Advanced multilevel degenerative changes. IMPRESSION: 1. No CT evidence for acute intra-abdominal or pelvic abnormality. 2. Hepatic steatosis. 3. Nonobstructing left kidney stone. 4. Diverticular disease of the sigmoid colon without acute inflammatory process. 5. Left greater than right mostly fat containing  inguinal hernias. Hernia on the left contains a sigmoid colon diverticulum but there are no inflammatory changes 6. Aortic atherosclerosis. Aortic Atherosclerosis (ICD10-I70.0). Electronically Signed   By: Donavan Foil M.D.   On: 10/16/2022 00:00   DG Chest 2 View  Result Date: 10/15/2022 CLINICAL DATA:  Shortness of breath. Bilateral ear pain. Cough and sore throat. EXAM: CHEST - 2 VIEW COMPARISON:  11/01/2016 FINDINGS: Shallow inspiration with linear atelectasis in the lung bases. Heart size and pulmonary vascularity are normal  for technique. No focal consolidations in the lungs. No pleural effusions. No pneumothorax. Mediastinal contours appear intact. Degenerative changes in the spine and shoulders. Old fracture deformity with severe degenerative changes in the right shoulder. IMPRESSION: Shallow inspiration with linear atelectasis in the lung bases. Electronically Signed   By: Lucienne Capers M.D.   On: 10/15/2022 17:41      LOS: 2 days    Cordelia Poche, MD [facility] 10/17/2022, 1:52 PM   If 7PM-7AM, please contact night-coverage [email or web address]

## 2022-10-18 ENCOUNTER — Other Ambulatory Visit (HOSPITAL_COMMUNITY): Payer: Self-pay

## 2022-10-18 DIAGNOSIS — N390 Urinary tract infection, site not specified: Secondary | ICD-10-CM | POA: Diagnosis not present

## 2022-10-18 LAB — URINE CULTURE: Culture: >=100000 — AB

## 2022-10-18 LAB — PROCALCITONIN: Procalcitonin: 2.63 ng/mL

## 2022-10-18 LAB — CBC
HCT: 39.3 % (ref 39.0–52.0)
Hemoglobin: 13.5 g/dL (ref 13.0–17.0)
MCH: 32.8 pg (ref 26.0–34.0)
MCHC: 34.4 g/dL (ref 30.0–36.0)
MCV: 95.4 fL (ref 80.0–100.0)
Platelets: 205 10*3/uL (ref 150–400)
RBC: 4.12 MIL/uL — ABNORMAL LOW (ref 4.22–5.81)
RDW: 12.4 % (ref 11.5–15.5)
WBC: 14.8 10*3/uL — ABNORMAL HIGH (ref 4.0–10.5)
nRBC: 0 % (ref 0.0–0.2)

## 2022-10-18 MED ORDER — CEFADROXIL 500 MG PO CAPS
500.0000 mg | ORAL_CAPSULE | Freq: Two times a day (BID) | ORAL | 0 refills | Status: AC
  Filled 2022-10-18: qty 8, 4d supply, fill #0

## 2022-10-18 NOTE — Discharge Instructions (Signed)
Andrew Foster,  You were in the hospital with a urinary tract infection. This has been managed with antibiotics with improvement in your symptoms. I recommend for you to continue antibiotics and for you to follow-up with your PCP in addition to your urologist.

## 2022-10-18 NOTE — Discharge Summary (Signed)
Physician Discharge Summary   Patient: Andrew Foster MRN: [id number] DOB: 19-Feb-1941  Admit date:     10/15/2022  Discharge date: 10/18/22  Discharge Physician: Cordelia Poche, MD   PCP: Hoyt Koch, MD   Recommendations at discharge:  PCP follow-up Urology follow-up  Discharge Diagnoses: Active Problems:   Pre-diabetes   Mixed hyperlipidemia   UTI (urinary tract infection)   Sepsis (HCC)   URI (upper respiratory infection)  Resolved Problems:   * No resolved hospital problems. *  Hospital Course: Andrew Foster is a 82 y.o. male with a history of BPH and hypertension. Patient presented secondary fatigue, cough, sore throat and dysuria. Patient found to meet criteria for sepsis with UTI as a source. Empiric Ceftriaxone started. Blood and urine cultures obtained.  Assessment and Plan:  Sepsis Present on admission. Presumed secondary to UTI. Urine and blood cultures obtained. Empiric Ceftriaxone IV started for urinary source. WBC elevated and improving. Urine culture significant for Proteus mirabilis. Procalcitonin up slightly with downtrend.   UTI Associated dysuria. Urinalysis is suggestive of infection Ceftriaxone started. Urine culture significant for proteus mirabilis.  Patient transitioned to cefadroxil on discharge.   Hyperlipidemia Continue pravastatin.   URI Symptoms improved.   Pre-diabetes Most recent hemoglobin A1C of 6.4%. Carb modified diet.   BPH Continue Flomax. Patient required in/out x1 but was able to void prior to discharge. Recommend follow-up with urologist.   Consultants: None Procedures performed: None  Disposition: Home Diet recommendation: Carb modified diet   DISCHARGE MEDICATION: Allergies as of 10/18/2022   No Known Allergies      Medication List     TAKE these medications    aspirin 81 MG tablet Take 81 mg by mouth daily.   cefadroxil 500 MG capsule Commonly known as: DURICEF Take 1 capsule (500 mg total) by  mouth 2 (two) times daily for 4 days.   diclofenac 75 MG EC tablet Commonly known as: VOLTAREN Take 1 tablet (75 mg total) by mouth 2 (two) times daily. What changed: when to take this   glucosamine-chondroitin 500-400 MG tablet Take 1 tablet by mouth every other day.   meclizine 12.5 MG tablet Commonly known as: ANTIVERT Take 1 tablet (12.5 mg total) by mouth 3 (three) times daily as needed for dizziness.   multivitamin tablet Take 1 tablet by mouth daily.   omeprazole 20 MG tablet Commonly known as: PRILOSEC OTC Take 20 mg by mouth daily.   pravastatin 20 MG tablet Commonly known as: PRAVACHOL Take 1 tablet (20 mg total) by mouth daily.   tamsulosin 0.4 MG Caps capsule Commonly known as: FLOMAX Take 1 capsule (0.4 mg total) by mouth at bedtime.   traMADol 50 MG tablet Commonly known as: ULTRAM Take 1 tablet by mouth once daily   VITAMIN B PLUS+ PO Take 1 tablet by mouth daily.   Vitamin D (Cholecalciferol) 25 MCG (1000 UT) Caps Take 1 capsule by mouth daily.        Follow-up Information     Hoyt Koch, MD. Schedule an appointment as soon as possible for a visit in 1 week(s).   Specialty: Internal Medicine Why: For hospital follow-up Contact information: [place] [address] [phone]         Festus Aloe, MD. Schedule an appointment as soon as possible for a visit in 1 week(s).   Specialty: Urology Contact information: [place] [address] [phone]  Discharge Exam: BP (!) 112/90 (BP Location: Right Arm)   Pulse 68   Temp 98.4 F (36.9 C) (Oral)   Resp 20   Ht 5' 6"$  (1.676 m)   Wt 78.4 kg   SpO2 95%   BMI 27.91 kg/m   General exam: Appears calm and comfortable Respiratory system: Clear to auscultation. Respiratory effort normal. Cardiovascular system: S1 & S2 heard, RRR. No murmurs, rubs, gallops or clicks. Gastrointestinal system: Abdomen is nondistended, soft  and nontender. No organomegaly or masses felt. Normal bowel sounds heard. Central nervous system: Alert and oriented. No focal neurological deficits. Musculoskeletal: No edema. No calf tenderness Skin: No cyanosis. No rashes Psychiatry: Judgement and insight appear normal. Mood & affect appropriate.   Condition at discharge: stable  The results of significant diagnostics from this hospitalization (including imaging, microbiology, ancillary and laboratory) are listed below for reference.   Imaging Studies: CT Renal Stone Study  Result Date: 10/16/2022 CLINICAL DATA:  Diffuse abdominal pain EXAM: CT ABDOMEN AND PELVIS WITHOUT CONTRAST TECHNIQUE: Multidetector CT imaging of the abdomen and pelvis was performed following the standard protocol without IV contrast. RADIATION DOSE REDUCTION: This exam was performed according to the departmental dose-optimization program which includes automated exposure control, adjustment of the mA and/or kV according to patient size and/or use of iterative reconstruction technique. COMPARISON:  CT 11/01/2016 FINDINGS: Lower chest: Lung bases demonstrate no acute airspace disease. Atelectasis or scarring at the bases. Coronary vascular calcification. Hepatobiliary: Hepatic steatosis. No calcified gallstone or biliary dilatation Pancreas: Unremarkable. No pancreatic ductal dilatation or surrounding inflammatory changes. Spleen: Normal in size without focal abnormality. Adrenals/Urinary Tract: Adrenal glands are normal. Kidneys show no hydronephrosis. Punctate nonobstructing stone lower pole left kidney. The bladder is unremarkable. Stomach/Bowel: The stomach is nonenlarged. No dilated small bowel. No acute bowel wall thickening. Negative appendix. Diverticular disease of the sigmoid colon without acute wall thickening. Vascular/Lymphatic: Advanced aortic atherosclerosis. No aneurysm. No suspicious lymph nodes. Reproductive: Enlarged prostate with mass effect on the bladder  Other: Negative for pelvic effusion or free air. Bilateral inguinal hernias left greater than right. Sigmoid colon diverticulum extends into left inguinal hernia but no convincing inflammation. Small periumbilical fat containing hernia. Musculoskeletal: No acute osseous abnormality. Grade 1 anterolisthesis L4 on L5. Mild retrolisthesis L1 on L2. Advanced multilevel degenerative changes. IMPRESSION: 1. No CT evidence for acute intra-abdominal or pelvic abnormality. 2. Hepatic steatosis. 3. Nonobstructing left kidney stone. 4. Diverticular disease of the sigmoid colon without acute inflammatory process. 5. Left greater than right mostly fat containing inguinal hernias. Hernia on the left contains a sigmoid colon diverticulum but there are no inflammatory changes 6. Aortic atherosclerosis. Aortic Atherosclerosis (ICD10-I70.0). Electronically Signed   By: Donavan Foil M.D.   On: 10/16/2022 00:00   DG Chest 2 View  Result Date: 10/15/2022 CLINICAL DATA:  Shortness of breath. Bilateral ear pain. Cough and sore throat. EXAM: CHEST - 2 VIEW COMPARISON:  11/01/2016 FINDINGS: Shallow inspiration with linear atelectasis in the lung bases. Heart size and pulmonary vascularity are normal for technique. No focal consolidations in the lungs. No pleural effusions. No pneumothorax. Mediastinal contours appear intact. Degenerative changes in the spine and shoulders. Old fracture deformity with severe degenerative changes in the right shoulder. IMPRESSION: Shallow inspiration with linear atelectasis in the lung bases. Electronically Signed   By: Lucienne Capers M.D.   On: 10/15/2022 17:41    Microbiology: Results for orders placed or performed during the hospital encounter of 10/15/22  Resp panel by RT-PCR (RSV, Flu  A&B, Covid) Anterior Nasal Swab     Status: None   Collection Time: 10/15/22  5:09 PM   Specimen: Anterior Nasal Swab  Result Value Ref Range Status   SARS Coronavirus 2 by RT PCR NEGATIVE NEGATIVE Final     Comment: (NOTE) SARS-CoV-2 target nucleic acids are NOT DETECTED.  The SARS-CoV-2 RNA is generally detectable in upper respiratory specimens during the acute phase of infection. The lowest concentration of SARS-CoV-2 viral copies this assay can detect is 138 copies/mL. A negative result does not preclude SARS-Cov-2 infection and should not be used as the sole basis for treatment or other patient management decisions. A negative result may occur with  improper specimen collection/handling, submission of specimen other than nasopharyngeal swab, presence of viral mutation(s) within the areas targeted by this assay, and inadequate number of viral copies(<138 copies/mL). A negative result must be combined with clinical observations, patient history, and epidemiological information. The expected result is Negative.  Fact Sheet for Patients:  EntrepreneurPulse.com.au  Fact Sheet for Healthcare Providers:  IncredibleEmployment.be  This test is no t yet approved or cleared by the [place] FDA and  has been authorized for detection and/or diagnosis of SARS-CoV-2 by FDA under an Emergency Use Authorization (EUA). This EUA will remain  in effect (meaning this test can be used) for the duration of the COVID-19 declaration under Section 564(b)(1) of the Act, 21 U.S.C.section 360bbb-3(b)(1), unless the authorization is terminated  or revoked sooner.       Influenza A by PCR NEGATIVE NEGATIVE Final   Influenza B by PCR NEGATIVE NEGATIVE Final    Comment: (NOTE) The Xpert Xpress SARS-CoV-2/FLU/RSV plus assay is intended as an aid in the diagnosis of influenza from Nasopharyngeal swab specimens and should not be used as a sole basis for treatment. Nasal washings and aspirates are unacceptable for Xpert Xpress SARS-CoV-2/FLU/RSV testing.  Fact Sheet for Patients: EntrepreneurPulse.com.au  Fact Sheet for Healthcare  Providers: IncredibleEmployment.be  This test is not yet approved or cleared by the [place] FDA and has been authorized for detection and/or diagnosis of SARS-CoV-2 by FDA under an Emergency Use Authorization (EUA). This EUA will remain in effect (meaning this test can be used) for the duration of the COVID-19 declaration under Section 564(b)(1) of the Act, 21 U.S.C. section 360bbb-3(b)(1), unless the authorization is terminated or revoked.     Resp Syncytial Virus by PCR NEGATIVE NEGATIVE Final    Comment: (NOTE) Fact Sheet for Patients: EntrepreneurPulse.com.au  Fact Sheet for Healthcare Providers: IncredibleEmployment.be  This test is not yet approved or cleared by the [place] FDA and has been authorized for detection and/or diagnosis of SARS-CoV-2 by FDA under an Emergency Use Authorization (EUA). This EUA will remain in effect (meaning this test can be used) for the duration of the COVID-19 declaration under Section 564(b)(1) of the Act, 21 U.S.C. section 360bbb-3(b)(1), unless the authorization is terminated or revoked.  Performed at [facility], [place] [address], [place], [place] [address]   Group A Strep by PCR     Status: None   Collection Time: 10/15/22  7:53 PM   Specimen: Throat; Sterile Swab  Result Value Ref Range Status   Group A Strep by PCR NOT DETECTED NOT DETECTED Final    Comment: Performed at [facility], [place] [address], [place], [place] [address]  Culture, blood (x 2)     Status: None (Preliminary result)   Collection Time: 10/15/22 10:15 PM   Specimen: BLOOD LEFT  FOREARM  Result Value Ref Range Status   Specimen Description   Final    BLOOD LEFT FOREARM Performed at [facility] [address], [place], [place] [address]    Special Requests   Final    BOTTLES DRAWN AEROBIC AND ANAEROBIC Blood Culture adequate volume Performed  at [facility] [address], [place], [place] [address]    Culture   Final    NO GROWTH 2 DAYS Performed at [facility] [address], [place], [place] [address]    Report Status PENDING  Incomplete  Culture, blood (x 2)     Status: None (Preliminary result)   Collection Time: 10/15/22 11:23 PM   Specimen: BLOOD  Result Value Ref Range Status   Specimen Description   Final    BLOOD RIGHT ANTECUBITAL Performed at [facility] [address], [place], [place] [address]    Special Requests   Final    BOTTLES DRAWN AEROBIC AND ANAEROBIC Blood Culture adequate volume Performed at [facility] [address], [place], [place] [address]    Culture   Final    NO GROWTH 2 DAYS Performed at [facility] [address] [place], [place] [address]    Report Status PENDING  Incomplete  Urine Culture (for pregnant, neutropenic or urologic patients or patients with an indwelling urinary catheter)     Status: Abnormal   Collection Time: 10/16/22 12:11 AM   Specimen: Urine, Clean Catch  Result Value Ref Range Status   Specimen Description   Final    URINE, CLEAN CATCH Performed at [facility], [place] [address] [place], [place] [address]    Special Requests   Final    NONE Performed at [facility], [place] [address] [place],  [address]    Culture >=100,000 COLONIES/mL PROTEUS MIRABILIS (A)  Final   Report Status 10/18/2022 FINAL  Final   Organism ID, Bacteria PROTEUS MIRABILIS (A)  Final      Susceptibility   Proteus mirabilis - MIC*    AMPICILLIN <=2 SENSITIVE Sensitive     CEFAZOLIN 8 SENSITIVE Sensitive     CEFEPIME <=0.12 SENSITIVE Sensitive     CEFTRIAXONE <=0.25 SENSITIVE Sensitive     CIPROFLOXACIN <=0.25 SENSITIVE Sensitive     GENTAMICIN <=1 SENSITIVE Sensitive     IMIPENEM 2 SENSITIVE Sensitive     NITROFURANTOIN 128 RESISTANT Resistant     TRIMETH/SULFA  <=20 SENSITIVE Sensitive     AMPICILLIN/SULBACTAM <=2 SENSITIVE Sensitive     PIP/TAZO <=4 SENSITIVE Sensitive     * >=100,000 COLONIES/mL PROTEUS MIRABILIS    Labs: CBC: Recent Labs  Lab 10/15/22 1727 10/16/22 0006 10/16/22 0229 10/17/22 0443 10/18/22 0403  WBC 21.5* 28.7* 27.0* 24.9* 14.8*  NEUTROABS 18.2* 24.4*  --   --   --   HGB 15.1 13.9 15.1 13.4 13.5  HCT 44.8 41.3 44.4 39.7 39.3  MCV 97.6 98.6 100.0 97.5 95.4  PLT 268 231 242 203 [id number]   Basic Metabolic Panel: Recent Labs  Lab 10/15/22 1727 10/16/22 0006 10/16/22 0229  NA 136 136 135  K 3.9 4.1 4.4  CL 103 102 101  CO2 22 24 23  $ GLUCOSE 128* 176* 116*  BUN 18 18 19  $ CREATININE 0.91 1.07 0.91  CALCIUM 9.0 8.6* 8.8*  MG  --  1.8  --   PHOS  --  3.5  --    Liver Function  Tests: Recent Labs  Lab 10/16/22 0006 10/16/22 0229  AST 39 39  ALT 29 30  ALKPHOS 60 64  BILITOT 0.6 1.2  PROT 6.8 7.2  ALBUMIN 3.6 3.8    Discharge time spent: 35 minutes.  Signed: Cordelia Poche, MD [facility] 10/18/2022

## 2022-10-18 NOTE — Progress Notes (Signed)
Per MD post void bladder scan needs to be done when patient voids to make sure pt is not retaining. Pt informed and verbalized understanding. Will call when voids.   Will continue to monitor.

## 2022-10-18 NOTE — Progress Notes (Signed)
Mobility Specialist - Progress Note   10/18/22 1135  Mobility  Activity Ambulated with assistance in hallway  Level of Assistance Modified independent, requires aide device or extra time  Assistive Device [facility] Ambulated (ft) 350 ft  Range of Motion/Exercises Active  Activity Response Tolerated well  Mobility Referral Yes  $Mobility charge 1 Mobility   Pt was found in bed and agreeable to ambulate. Had no complaints during session and at EOS returned to bed with all necessities in reach.  Ferd Hibbs Mobility Specialist

## 2022-10-18 NOTE — Progress Notes (Signed)
Pt discharged to home at this time. Pt provided medication from out pt pharmacy. Pt stable at time of dc and left in personal vehicle driven by son.

## 2022-10-18 NOTE — Progress Notes (Signed)
IV, tele removed.  Discharge instruction given. Family will pick up medication from the pharmacy. Awaiting on the transport.

## 2022-10-20 ENCOUNTER — Telehealth: Payer: Self-pay | Admitting: *Deleted

## 2022-10-20 ENCOUNTER — Other Ambulatory Visit: Payer: Self-pay | Admitting: Internal Medicine

## 2022-10-20 ENCOUNTER — Encounter: Payer: Self-pay | Admitting: *Deleted

## 2022-10-20 NOTE — Progress Notes (Signed)
  Care Coordination  Note  10/20/2022 Name: Andrew Foster MRN: [id number] DOB: 08/25/1941  Andrew Foster is a 82 y.o. year old primary care patient of Hoyt Koch, MD. I reached out to York Grice by phone today to assist with scheduling a follow up appointment. Comfort verbally consented to my assistance.       Follow up plan: Hospital Follow Up appointment scheduled with (Dr Sharlet Salina) on (10/28/2022) at (11am).  Julian Hy, [place] Direct Dial: [phone]

## 2022-10-20 NOTE — Transitions of Care (Post Inpatient/ED Visit) (Signed)
   10/20/2022  Name: Andrew Foster MRN: [id number] DOB: 1941/07/05  Today's TOC FU Call Status: Today's TOC FU Call Status:: Unsuccessul Call (1st Attempt) Unsuccessful Call (1st Attempt) Date: 10/20/22  Attempted to reach the patient regarding the most recent Inpatient visit.  Follow Up Plan: Additional outreach attempts will be made to reach the patient to complete the Transitions of Care (Post Inpatient/ED visit) call.   Left voice message requesting call back  Oneta Rack, RN, BSN, CCRN Alumnus RN CM Care Coordination/ Transition of [place] Management [phone]: direct office

## 2022-10-20 NOTE — Transitions of Care (Post Inpatient/ED Visit) (Signed)
   10/20/2022  Name: Andrew Foster MRN: [id number] DOB: 13-Dec-1940  Today's TOC FU Call Status: Today's TOC FU Call Status:: Successful TOC FU Call Competed (HIPAA/ identity verified x 2 identifiers) TOC FU Call Complete Date: 10/20/22  Transition Care Management Follow-up Telephone Call Date of Discharge: 10/18/22 Discharge Facility: [facility]) Type of Discharge: Inpatient Admission Primary Inpatient Discharge Diagnosis:: UTI sepsis How have you been since you were released from the hospital?: Better Any questions or concerns?: No  Items Reviewed: Did you receive and understand the discharge instructions provided?: Yes (reviewed thoroughly with patient, who verbalizes very good understanding of same without prompting) Medications obtained and verified?: Yes (Medications Reviewed) (Full medication review completed; no concerns/ discrepancies identified; med list updated; self-manages medications and denies questions/ concerns around medications; confirmed obtained/ is taking newly prescribed meds post-hospital discharge) Any new allergies since your discharge?: No Dietary orders reviewed?: No Do you have support at home?: Yes People in Home: alone Name of Support/Comfort Primary Source: son lives next door; sister lives on same street; patient reports he is essentially independent in self-care activities  [place] and Equipment/Supplies: [place] Ordered?: No Any new equipment or medical supplies ordered?: No  Functional Questionnaire: Do you need assistance with bathing/showering or dressing?: No Do you need assistance with meal preparation?: No Do you need assistance with eating?: No Do you have difficulty maintaining continence: No Do you need assistance with getting out of bed/getting out of a chair/moving?: No Do you have difficulty managing or taking your medications?: No  Folllow up appointments reviewed: PCP Follow-up appointment confirmed?:  Yes Date of PCP follow-up appointment?: 10/28/22 Follow-up Provider: PCP [facility] Follow-up appointment confirmed?: No Reason Specialist Follow-Up Not Confirmed: Patient has Specialist Provider Number and will Call for Appointment Do you need transportation to your follow-up appointment?: No Do you understand care options if your condition(s) worsen?: Yes-patient verbalized understanding  SDOH Interventions Today    Flowsheet Row Most Recent Value  SDOH Interventions   Food Insecurity Interventions Intervention Not Indicated  Transportation Interventions Intervention Not Indicated  [drives self,  family assists as/ if needed]      TOC Interventions Today    Flowsheet Row Most Recent Value  TOC Interventions   TOC Interventions Discussed/Reviewed TOC Interventions Discussed, Arranged PCP follow up less than 12 days/Care Guide scheduled  [care coordination outreach in real-time with scheduling care guide to successfully hospital follow up PCP appointment on 10/28/22,  provided my direct phone number should concerns/ questions/ needs arise in the future]      Interventions Today    Flowsheet Row Most Recent Value  Chronic Disease   Chronic disease during today's visit Other  [UTI/ sepsis]  General Interventions   General Interventions Discussed/Reviewed General Interventions Discussed, Doctor Visits  Doctor Visits Discussed/Reviewed Doctor Visits Discussed, PCP, Specialist  PCP/Specialist Visits Compliance with follow-up visit  Nutrition Interventions   Nutrition Discussed/Reviewed Nutrition Discussed  Pharmacy Interventions   Pharmacy Dicussed/Reviewed Pharmacy Topics Discussed  [full medication review completed]      Oneta Rack, RN, BSN, CCRN Alumnus RN CM Care Coordination/ Transition of [place] Management [phone]: direct office

## 2022-10-21 LAB — CULTURE, BLOOD (ROUTINE X 2)
Culture: NO GROWTH
Culture: NO GROWTH
Special Requests: ADEQUATE
Special Requests: ADEQUATE

## 2022-10-28 ENCOUNTER — Ambulatory Visit (INDEPENDENT_AMBULATORY_CARE_PROVIDER_SITE_OTHER): Payer: Medicare Other | Admitting: Internal Medicine

## 2022-10-28 ENCOUNTER — Encounter: Payer: Self-pay | Admitting: Internal Medicine

## 2022-10-28 VITALS — BP 120/80 | HR 73 | Temp 98.0°F | Ht 66.0 in | Wt 180.0 lb

## 2022-10-28 DIAGNOSIS — N390 Urinary tract infection, site not specified: Secondary | ICD-10-CM | POA: Diagnosis not present

## 2022-10-28 DIAGNOSIS — N400 Enlarged prostate without lower urinary tract symptoms: Secondary | ICD-10-CM | POA: Insufficient documentation

## 2022-10-28 DIAGNOSIS — K4021 Bilateral inguinal hernia, without obstruction or gangrene, recurrent: Secondary | ICD-10-CM | POA: Diagnosis not present

## 2022-10-28 NOTE — Assessment & Plan Note (Signed)
With mass effect on the bladder per symptoms and CT scan. Taking flomax 0.4 mg BID currently and seeing urology later this week. He is interested in definitive treatment.

## 2022-10-28 NOTE — Progress Notes (Signed)
   Subjective:   Patient ID: Andrew Foster, male    DOB: 1941/07/09, 82 y.o.   MRN: [id number]  HPI The patient is an 82 YO man coming in for hospital follow up (in for UTI and sepsis, given antibiotics and required I/O cath able to urinate prior to discharge). Imaging revealed large prostate with mass effect on bladder. Taking flomax BID since discharge which has helped slightly but still disrupting sleep. Also bilateral inguinal hernias on CT no symptoms of those.No more urinary symptoms and feels back to normal.  PMH, [place], social history reviewed and updated  Review of Systems  Constitutional: Negative.   HENT: Negative.    Eyes: Negative.   Respiratory:  Negative for cough, chest tightness and shortness of breath.   Cardiovascular:  Negative for chest pain, palpitations and leg swelling.  Gastrointestinal:  Negative for abdominal distention, abdominal pain, constipation, diarrhea, nausea and vomiting.  Genitourinary:  Positive for difficulty urinating, enuresis and frequency.  Musculoskeletal: Negative.   Skin: Negative.   Neurological: Negative.   Psychiatric/Behavioral: Negative.      Objective:  Physical Exam Constitutional:      Appearance: He is well-developed.  HENT:     Head: Normocephalic and atraumatic.  Cardiovascular:     Rate and Rhythm: Normal rate and regular rhythm.  Pulmonary:     Effort: Pulmonary effort is normal. No respiratory distress.     Breath sounds: Normal breath sounds. No wheezing or rales.  Abdominal:     General: Bowel sounds are normal. There is no distension.     Palpations: Abdomen is soft.     Tenderness: There is no abdominal tenderness. There is no rebound.  Musculoskeletal:     Cervical back: Normal range of motion.  Skin:    General: Skin is warm and dry.  Neurological:     Mental Status: He is alert and oriented to person, place, and time.     Coordination: Coordination normal.     Vitals:   10/28/22 1046  BP: 120/80   Pulse: 73  Temp: 98 F (36.7 C)  TempSrc: Oral  SpO2: 98%  Weight: 180 lb (81.6 kg)  Height: 5' 6"$  (1.676 m)    Assessment & Plan:  Visit time 20 minutes in face to face communication with patient and coordination of care, additional 10 minutes spent in record review, coordination or care, ordering tests, communicating/referring to other healthcare professionals, documenting in medical records all on the same day of the visit for total time 30 minutes spent on the visit.

## 2022-10-28 NOTE — Assessment & Plan Note (Signed)
Resolved now and still having urinary frequency and difficulty to urinate. Seeing urology later this week. He is looking for definitive treatment to help his symptoms as the frequency at night time is disruptive to his sleep.

## 2022-10-28 NOTE — Patient Instructions (Signed)
We will get you in with a surgeon for the hernias.

## 2022-10-28 NOTE — Assessment & Plan Note (Signed)
Referral to general surgery for evaluation. Likely should undergo repair with some large intestine in the left hernia without signs of gangrene or obstruction currently. Ct done during hospital stay. Prior repair (he thinks right) in the past 20+ years ago.

## 2022-10-30 DIAGNOSIS — R350 Frequency of micturition: Secondary | ICD-10-CM | POA: Diagnosis not present

## 2022-10-30 DIAGNOSIS — N401 Enlarged prostate with lower urinary tract symptoms: Secondary | ICD-10-CM | POA: Diagnosis not present

## 2022-10-30 DIAGNOSIS — N3000 Acute cystitis without hematuria: Secondary | ICD-10-CM | POA: Diagnosis not present

## 2022-11-14 DIAGNOSIS — K4020 Bilateral inguinal hernia, without obstruction or gangrene, not specified as recurrent: Secondary | ICD-10-CM | POA: Diagnosis not present

## 2022-11-14 DIAGNOSIS — K439 Ventral hernia without obstruction or gangrene: Secondary | ICD-10-CM | POA: Diagnosis not present

## 2022-11-26 DIAGNOSIS — R350 Frequency of micturition: Secondary | ICD-10-CM | POA: Diagnosis not present

## 2022-11-26 DIAGNOSIS — R351 Nocturia: Secondary | ICD-10-CM | POA: Diagnosis not present

## 2023-01-01 DIAGNOSIS — N401 Enlarged prostate with lower urinary tract symptoms: Secondary | ICD-10-CM | POA: Diagnosis not present

## 2023-01-01 DIAGNOSIS — R351 Nocturia: Secondary | ICD-10-CM | POA: Diagnosis not present

## 2023-01-23 ENCOUNTER — Telehealth: Payer: Self-pay | Admitting: Internal Medicine

## 2023-01-23 ENCOUNTER — Ambulatory Visit (INDEPENDENT_AMBULATORY_CARE_PROVIDER_SITE_OTHER): Payer: Medicare Other | Admitting: Internal Medicine

## 2023-01-23 ENCOUNTER — Encounter: Payer: Self-pay | Admitting: Internal Medicine

## 2023-01-23 ENCOUNTER — Ambulatory Visit (INDEPENDENT_AMBULATORY_CARE_PROVIDER_SITE_OTHER): Payer: Medicare Other | Admitting: *Deleted

## 2023-01-23 ENCOUNTER — Ambulatory Visit (INDEPENDENT_AMBULATORY_CARE_PROVIDER_SITE_OTHER): Payer: Medicare Other

## 2023-01-23 VITALS — BP 120/70 | HR 73 | Temp 97.8°F | Ht 66.0 in | Wt 173.0 lb

## 2023-01-23 VITALS — Ht 66.0 in | Wt 173.0 lb

## 2023-01-23 DIAGNOSIS — M25551 Pain in right hip: Secondary | ICD-10-CM | POA: Diagnosis not present

## 2023-01-23 DIAGNOSIS — Z0000 Encounter for general adult medical examination without abnormal findings: Secondary | ICD-10-CM

## 2023-01-23 DIAGNOSIS — M25552 Pain in left hip: Secondary | ICD-10-CM | POA: Diagnosis not present

## 2023-01-23 NOTE — Patient Instructions (Signed)
Keep using tylenol and we will do the x-ray today.

## 2023-01-23 NOTE — Progress Notes (Signed)
   Subjective:   Patient ID: Andrew Foster, male    DOB: 12/14/1940, 82 y.o.   MRN: [id number]  HPI The patient is an 82 YO man coming in for right side pain. Also constipation 5-6 days. Tried miralax and milk of magnesium. Pain improving with 2 BM yesterday and today.  Review of Systems  Constitutional: Negative.   HENT: Negative.    Eyes: Negative.   Respiratory:  Negative for cough, chest tightness and shortness of breath.   Cardiovascular:  Negative for chest pain, palpitations and leg swelling.  Gastrointestinal:  Positive for abdominal pain. Negative for abdominal distention, constipation, diarrhea, nausea and vomiting.  Musculoskeletal: Negative.   Skin: Negative.   Neurological: Negative.   Psychiatric/Behavioral: Negative.      Objective:  Physical Exam Constitutional:      Appearance: He is well-developed.  HENT:     Head: Normocephalic and atraumatic.  Cardiovascular:     Rate and Rhythm: Normal rate and regular rhythm.  Pulmonary:     Effort: Pulmonary effort is normal. No respiratory distress.     Breath sounds: Normal breath sounds. No wheezing or rales.  Abdominal:     General: Bowel sounds are normal. There is no distension.     Palpations: Abdomen is soft.     Tenderness: There is abdominal tenderness. There is no rebound.     Comments: Mild tenderness to palpation right lower quadrant no rebound or guarding  Musculoskeletal:     Cervical back: Normal range of motion.  Skin:    General: Skin is warm and dry.  Neurological:     Mental Status: He is alert and oriented to person, place, and time.     Coordination: Coordination normal.     Vitals:   01/23/23 0810  BP: 120/70  Pulse: 73  Temp: 97.8 F (36.6 C)  TempSrc: Oral  SpO2: 93%  Weight: 173 lb (78.5 kg)  Height: 5\' 6"  (1.676 m)    Assessment & Plan:

## 2023-01-23 NOTE — Assessment & Plan Note (Signed)
Pain is RLQ and possibly hip. No falls to suggest injury. Ordered x-ray bilateral hips. He also has inguinal hernia although no new bulge to suggest incarceration or progression. He was constipated in same timeline and BM helped relieve pain about 50%. Encouraged to keep taking milk of magnesia daily until having BM regularly. No signs of obstruction on exam and good BS.

## 2023-01-23 NOTE — Patient Instructions (Signed)
Health Maintenance, Male Adopting a healthy lifestyle and getting preventive care are important in promoting health and wellness. Ask your health care provider about: The right schedule for you to have regular tests and exams. Things you can do on your own to prevent diseases and keep yourself healthy. What should I know about diet, weight, and exercise? Eat a healthy diet  Eat a diet that includes plenty of vegetables, fruits, low-fat dairy products, and lean protein. Do not eat a lot of foods that are high in solid fats, added sugars, or sodium. Maintain a healthy weight Body mass index (BMI) is a measurement that can be used to identify possible weight problems. It estimates body fat based on height and weight. Your health care provider can help determine your BMI and help you achieve or maintain a healthy weight. Get regular exercise Get regular exercise. This is one of the most important things you can do for your health. Most adults should: Exercise for at least 150 minutes each week. The exercise should increase your heart rate and make you sweat (moderate-intensity exercise). Do strengthening exercises at least twice a week. This is in addition to the moderate-intensity exercise. Spend less time sitting. Even light physical activity can be beneficial. Watch cholesterol and blood lipids Have your blood tested for lipids and cholesterol at 83 years of age, then have this test every 5 years. You may need to have your cholesterol levels checked more often if: Your lipid or cholesterol levels are high. You are older than 83 years of age. You are at high risk for heart disease. What should I know about cancer screening? Many types of cancers can be detected early and may often be prevented. Depending on your health history and family history, you may need to have cancer screening at various ages. This may include screening for: Colorectal cancer. Prostate cancer. Skin cancer. Lung  cancer. What should I know about heart disease, diabetes, and high blood pressure? Blood pressure and heart disease High blood pressure causes heart disease and increases the risk of stroke. This is more likely to develop in people who have high blood pressure readings or are overweight. Talk with your health care provider about your target blood pressure readings. Have your blood pressure checked: Every 3-5 years if you are 18-39 years of age. Every year if you are 40 years old or older. If you are between the ages of 65 and 75 and are a current or former smoker, ask your health care provider if you should have a one-time screening for abdominal aortic aneurysm (AAA). Diabetes Have regular diabetes screenings. This checks your fasting blood sugar level. Have the screening done: Once every three years after age 45 if you are at a normal weight and have a low risk for diabetes. More often and at a younger age if you are overweight or have a high risk for diabetes. What should I know about preventing infection? Hepatitis B If you have a higher risk for hepatitis B, you should be screened for this virus. Talk with your health care provider to find out if you are at risk for hepatitis B infection. Hepatitis C Blood testing is recommended for: Everyone born from 1945 through 1965. Anyone with known risk factors for hepatitis C. Sexually transmitted infections (STIs) You should be screened each year for STIs, including gonorrhea and chlamydia, if: You are sexually active and are younger than 83 years of age. You are older than 83 years of age and your   health care provider tells you that you are at risk for this type of infection. Your sexual activity has changed since you were last screened, and you are at increased risk for chlamydia or gonorrhea. Ask your health care provider if you are at risk. Ask your health care provider about whether you are at high risk for HIV. Your health care provider  may recommend a prescription medicine to help prevent HIV infection. If you choose to take medicine to prevent HIV, you should first get tested for HIV. You should then be tested every 3 months for as long as you are taking the medicine. Follow these instructions at home: Alcohol use Do not drink alcohol if your health care provider tells you not to drink. If you drink alcohol: Limit how much you have to 0-2 drinks a day. Know how much alcohol is in your drink. In the [place] one drink equals one 12 oz bottle of beer (355 mL), one 5 oz glass of wine (148 mL), or one 1 oz glass of hard liquor (44 mL). Lifestyle Do not use any products that contain nicotine or tobacco. These products include cigarettes, chewing tobacco, and vaping devices, such as e-cigarettes. If you need help quitting, ask your health care provider. Do not use street drugs. Do not share needles. Ask your health care provider for help if you need support or information about quitting drugs. General instructions Schedule regular health, dental, and eye exams. Stay current with your vaccines. Tell your health care provider if: You often feel depressed. You have ever been abused or do not feel safe at home. Summary Adopting a healthy lifestyle and getting preventive care are important in promoting health and wellness. Follow your health care provider's instructions about healthy diet, exercising, and getting tested or screened for diseases. Follow your health care provider's instructions on monitoring your cholesterol and blood pressure. This information is not intended to replace advice given to you by your health care provider. Make sure you discuss any questions you have with your health care provider. Document Revised: 01/07/2021 Document Reviewed: 01/07/2021 Elsevier Patient Education  2024 Elsevier Inc.  

## 2023-01-23 NOTE — Progress Notes (Signed)
Subjective:   Andrew Foster is a 82 y.o. male who presents for Medicare Annual/Subsequent preventive examination. I connected with  Andrew Foster on 01/23/23 by a audio enabled telemedicine application and verified that I am speaking with the correct person using two identifiers.  Patient Location: Home  Provider Location: Office/Clinic  I discussed the limitations of evaluation and management by telemedicine. The patient expressed understanding and agreed to proceed.  Review of Systems    Defer to PCP Cardiac Risk Factors include: advanced age (>38men, >66 women);sedentary lifestyle;male gender     Objective:    Today's Vitals   01/23/23 1337 01/23/23 1338  Weight: 173 lb (78.5 kg)   Height: 5\' 6"  (1.676 m)   PainSc:  5    Body mass index is 27.92 kg/m.     01/23/2023    1:40 PM 10/16/2022   12:00 AM 10/15/2022    5:05 PM 12/19/2021    9:24 AM 12/18/2020    9:37 AM 12/19/2019    9:50 AM 12/17/2018    9:19 AM  Advanced Directives  Does Patient Have a Medical Advance Directive? Yes Yes No Yes Yes Yes No  Type of Estate agent of [place];Living will Healthcare Power of Attorney  Living will;Healthcare Power of Attorney Living will;Healthcare Power of [employer] Power of [place];Living will   Does patient want to make changes to medical advance directive?  No - Patient declined  No - Patient declined No - Patient declined No - Patient declined Yes (ED - Information included in AVS)  Copy of Healthcare Power of Attorney in Chart?  No - copy requested  No - copy requested No - copy requested No - copy requested     Current Medications (verified) Outpatient Encounter Medications as of 01/23/2023  Medication Sig   aspirin 81 MG tablet Take 81 mg by mouth daily.   diclofenac (VOLTAREN) 75 MG EC tablet Take 1 tablet (75 mg total) by mouth 2 (two) times daily. (Patient taking differently: Take 75 mg by mouth in the morning.)   finasteride (PROSCAR)  5 MG tablet Take 5 mg by mouth daily.   glucosamine-chondroitin 500-400 MG tablet Take 1 tablet by mouth every other day.   meclizine (ANTIVERT) 12.5 MG tablet Take 1 tablet (12.5 mg total) by mouth 3 (three) times daily as needed for dizziness.   Multiple Vitamin (MULTIVITAMIN) tablet Take 1 tablet by mouth daily.   omeprazole (PRILOSEC OTC) 20 MG tablet Take 20 mg by mouth daily.   pravastatin (PRAVACHOL) 20 MG tablet Take 1 tablet (20 mg total) by mouth daily.   tamsulosin (FLOMAX) 0.4 MG CAPS capsule Take 1 capsule (0.4 mg total) by mouth at bedtime.   traMADol (ULTRAM) 50 MG tablet Take 1 tablet by mouth once daily   Vit B6-Vit B12-Omega 3 Acids (VITAMIN B PLUS+ PO) Take 1 tablet by mouth daily.   Vitamin D, Cholecalciferol, 1000 units CAPS Take 1 capsule by mouth daily.   No facility-administered encounter medications on file as of 01/23/2023.    Allergies (verified) Patient has no known allergies.   History: Past Medical History:  Diagnosis Date   Acute pyelonephritis 11/01/2016   Adenomatous polyps of colon 01/2017   no f/u - age   BPH (benign prostatic hyperplasia)    Diverticulosis    Hypertension    Past Surgical History:  Procedure Laterality Date   CARPAL TUNNEL RELEASE     COLONOSCOPY     Tics;  GI  HERNIA REPAIR     intra articular steroids     R shoulder   PROSTATE BIOPSY     Dr Brunilda Payor   VASECTOMY     Family History  Problem Relation Age of Onset   Heart attack Mother 3   Diabetes Mother    Diabetes Sister        TWO sisters   Diabetes Brother        TWO brothers   Diabetes type I Brother    Stroke Neg Hx    Cancer Neg Hx    Colon cancer Neg Hx    Social History   Socioeconomic History   Marital status: Widowed    Spouse name: Not on file   Number of children: 4   Years of education: Not on file   Highest education level: Not on file  Occupational History   Not on file  Tobacco Use   Smoking status: Former    Types: Cigarettes     Quit date: 09/01/1974    Years since quitting: 48.4   Smokeless tobacco: Never   Tobacco comments:    smoked 1956-1976, up to 3 ppd  Vaping Use   Vaping Use: Never used  Substance and Sexual Activity   Alcohol use: No   Drug use: No   Sexual activity: Not Currently  Other Topics Concern   Not on file  Social History Narrative   Not on file   Social Determinants of Health   Financial Resource Strain: Low Risk  (01/23/2023)   Overall Financial Resource Strain (CARDIA)    Difficulty of Paying Living Expenses: Not hard at all  Food Insecurity: No Food Insecurity (01/23/2023)   Hunger Vital Sign    Worried About Running Out of Food in the Last Year: Never true    Ran Out of Food in the Last Year: Never true  Transportation Needs: No Transportation Needs (01/23/2023)   PRAPARE - Administrator, Civil Service (Medical): No    Lack of Transportation (Non-Medical): No  Physical Activity: Inactive (01/23/2023)   Exercise Vital Sign    Days of Exercise per Week: 0 days    Minutes of Exercise per Session: 0 min  Stress: No Stress Concern Present (01/23/2023)   [employer] of Occupational Health - Occupational Stress Questionnaire    Feeling of Stress : Not at all  Social Connections: Moderately Isolated (01/23/2023)   Social Connection and Isolation Panel [NHANES]    Frequency of Communication with Friends and Family: More than three times a week    Frequency of Social Gatherings with Friends and Family: More than three times a week    Attends Religious Services: More than 4 times per year    Active Member of [employer] or Organizations: No    Attends Banker Meetings: Never    Marital Status: Widowed    Tobacco Counseling Counseling given: Not Answered Tobacco comments: smoked 1956-1976, up to 3 ppd   Clinical Intake:  Pre-visit preparation completed: Yes  Pain : 0-10 Pain Score: 5  Pain Type: Chronic pain Pain Location: Back Pain Orientation:  Lower Pain Descriptors / Indicators: Aching, Dull Pain Onset: In the past 7 days Pain Frequency: Intermittent     Nutritional Status: BMI 25 -29 Overweight Nutritional Risks: None Diabetes: No  How often do you need to have someone help you when you read instructions, pamphlets, or other written materials from your doctor or pharmacy?: 1 - Never What is the last grade  level you completed in school?: 12th grade  Diabetic?no  Interpreter Needed?: No      Activities of Daily Living    01/23/2023    1:52 PM 01/23/2023    1:44 PM  In your present state of health, do you have any difficulty performing the following activities:  Hearing?  0  Vision?  1  Difficulty concentrating or making decisions?  0  Walking or climbing stairs?  0  Dressing or bathing?  0  Doing errands, shopping?  0  Preparing Food and eating ? N   Using the Toilet? N   In the past six months, have you accidently leaked urine? N   Do you have problems with loss of bowel control? N   Managing your Medications? N   Managing your Finances? N   Housekeeping or managing your Housekeeping? N     Patient Care Team: Myrlene Broker, MD as PCP - General (Internal Medicine) Elwin Mocha, MD as Consulting Physician (Ophthalmology) Szabat, Vinnie Level, [facility] (Inactive) as Pharmacist (Pharmacist)  Indicate any recent Medical Services you may have received from other than [facility] providers in the past year (date may be approximate).     Assessment:   This is a routine wellness examination for [place].  Hearing/Vision screen Vision Screening - Comments:: Annual eye exam   Dietary issues and exercise activities discussed: Current Exercise Habits: The patient does not participate in regular exercise at present, Exercise limited by: None identified   Goals Addressed   None   Depression Screen    01/23/2023    1:43 PM 01/23/2023    1:41 PM 10/28/2022   10:50 AM 07/30/2022    9:56 AM 12/19/2021    8:43  AM 12/18/2020    9:34 AM 12/19/2019    9:50 AM  PHQ 2/9 Scores  PHQ - 2 Score 0 0 0 0 0 0 0  PHQ- 9 Score   0 0       Fall Risk    01/23/2023    1:41 PM 01/23/2023    8:13 AM 10/28/2022   10:50 AM 07/30/2022    9:56 AM 12/19/2021    8:44 AM  Fall Risk   Falls in the past year? 0 0 0 1 0  Number falls in past yr: 0 0 0 0 0  Injury with Fall? 0 0 0 0 0  Risk for fall due to : No Fall Risks    No Fall Risks  Follow up Falls evaluation completed Falls evaluation completed Falls evaluation completed Falls evaluation completed Falls evaluation completed    FALL RISK PREVENTION PERTAINING TO THE HOME:  Any stairs in or around the home? No  If so, are there any without handrails? No  Home free of loose throw rugs in walkways, pet beds, electrical cords, etc? Yes  Adequate lighting in your home to reduce risk of falls? Yes   ASSISTIVE DEVICES UTILIZED TO PREVENT FALLS:  Life alert? No  Use of a cane, walker or w/c? Yes  Grab bars in the bathroom? No  Shower chair or bench in shower? No  Elevated toilet seat or a handicapped toilet? Yes   TIMED UP AND GO:  Was the test performed? No .  Length of time to ambulate 10 feet: n/a sec.     Cognitive Function:    12/15/2017    9:30 AM  MMSE - Mini Mental State Exam  Orientation to time 5  Orientation to Place 5  Registration 3  Attention/ Calculation 5  Recall 1  Language- name 2 objects 2  Language- repeat 1  Language- follow 3 step command 3  Language- read & follow direction 1  Write a sentence 1  Copy design 1  Total score 28        01/23/2023    1:50 PM 12/19/2021    9:26 AM 12/19/2019    9:52 AM  6CIT Screen  What Year? 0 points 0 points 0 points  What month? 0 points 0 points 0 points  What time? 0 points 0 points 0 points  Count back from 20 0 points 0 points 0 points  Months in reverse 0 points 0 points 0 points  Repeat phrase 0 points 0 points 0 points  Total Score 0 points 0 points 0 points     Immunizations Immunization History  Administered Date(s) Administered   Fluad Quad(high Dose 65+) 06/07/2019, 07/19/2020, 07/31/2021, 07/30/2022   Influenza Whole 07/13/2007   Influenza, High Dose Seasonal PF 06/09/2017, 08/10/2018   Influenza,inj,Quad PF,6+ Mos 05/01/2016   Influenza-Unspecified 10/03/2015, 08/10/2018   Moderna Sars-Covid-2 Vaccination 09/26/2019, 10/21/2019, 07/31/2020   Pneumococcal Conjugate-13 12/10/2016   Pneumococcal Polysaccharide-23 10/03/2015   Td 04/22/2012   Zoster Recombinat (Shingrix) 06/12/2022    TDAP status: Due, Education has been provided regarding the importance of this vaccine. Advised may receive this vaccine at local pharmacy or Health Dept. Aware to provide a copy of the vaccination record if obtained from local pharmacy or Health Dept. Verbalized acceptance and understanding.  Flu Vaccine status: Up to date  Pneumococcal vaccine status: Up to date  Covid-19 vaccine status: Completed vaccines  Qualifies for Shingles Vaccine? Yes   Zostavax completed No   Shingrix Completed?: No.    Education has been provided regarding the importance of this vaccine. Patient has been advised to call insurance company to determine out of pocket expense if they have not yet received this vaccine. Advised may also receive vaccine at local pharmacy or Health Dept. Verbalized acceptance and understanding.  Screening Tests Health Maintenance  Topic Date Due   DTaP/Tdap/Td (2 - Tdap) 04/22/2022   Zoster Vaccines- Shingrix (2 of 2) 08/07/2022   COVID-19 Vaccine (4 - 2023-24 season) 02/08/2023 (Originally 05/02/2022)   INFLUENZA VACCINE  04/02/2023   Medicare Annual Wellness (AWV)  01/23/2024   Pneumonia Vaccine 72+ Years old  Completed   HPV VACCINES  Aged Out    Health Maintenance  Health Maintenance Due  Topic Date Due   DTaP/Tdap/Td (2 - Tdap) 04/22/2022   Zoster Vaccines- Shingrix (2 of 2) 08/07/2022    Colorectal cancer screening: No longer  required.   Lung Cancer Screening: (Low Dose CT Chest recommended if Age 67-80 years, 30 pack-year currently smoking OR have quit w/in 15years.) does not qualify.   Lung Cancer Screening Referral: n/a  Additional Screening:  Hepatitis C Screening: does qualify; Completed unknown  Vision Screening: Recommended annual ophthalmology exams for early detection of glaucoma and other disorders of the eye. Is the patient up to date with their annual eye exam?  Yes  Who is the provider or what is the name of the office in which the patient attends annual eye exams? Unknown, patient started seeing a new eye doctor If pt is not established with a provider, would they like to be referred to a provider to establish care? No .   Dental Screening: Recommended annual dental exams for proper oral hygiene  Community Resource Referral / Chronic Care Management: CRR required this visit?  No   CCM required this visit?  No      Plan:     I have personally reviewed and noted the following in the patient's chart:   Medical and social history Use of alcohol, tobacco or illicit drugs  Current medications and supplements including opioid prescriptions. Patient is not currently taking opioid prescriptions. Functional ability and status Nutritional status Physical activity Advanced directives List of other physicians Hospitalizations, surgeries, and ER visits in previous 12 months Vitals Screenings to include cognitive, depression, and falls Referrals and appointments  In addition, I have reviewed and discussed with patient certain preventive protocols, quality metrics, and best practice recommendations. A written personalized care plan for preventive services as well as general preventive health recommendations were provided to patient.     Tamela Oddi, CMA   01/23/2023  Non face to face 30 minutes  Nurse Notes:  [name] , Thank you for taking time to come for your Medicare Wellness  Visit. I appreciate your ongoing commitment to your health goals. Please review the following plan we discussed and let me know if I can assist you in the future.   These are the goals we discussed:  Goals      Get my left knee back in shape with no pain.     lose weight to feel healthy     Start to walk daily, eat healthy, and decrease portion size.      Manage My Medicine     Timeframe:  Long-Range Goal Priority:  Medium Start Date:  10/17/2021                           Expected End Date: 10/17/2022                      Follow Up Date 04/16/2022   - call for medicine refill 2 or 3 days before it runs out - call if I am sick and can't take my medicine - keep a list of all the medicines I take; vitamins and herbals too - learn to read medicine labels    Why is this important?   These steps will help you keep on track with your medicines.   Notes:      Patient Stated     Stay as healthy and as independent as possible and keep riding my [employer].     Patient Stated     Stay physically active, eat healthy, and enjoy my family and dogs.        This is a list of the screening recommended for you and due dates:  Health Maintenance  Topic Date Due   DTaP/Tdap/Td vaccine (2 - Tdap) 04/22/2022   Zoster (Shingles) Vaccine (2 of 2) 08/07/2022   COVID-19 Vaccine (4 - 2023-24 season) 02/08/2023*   Flu Shot  04/02/2023   Medicare Annual Wellness Visit  01/23/2024   Pneumonia Vaccine  Completed   HPV Vaccine  Aged Out  *Topic was postponed. The date shown is not the original due date.

## 2023-01-30 ENCOUNTER — Telehealth: Payer: Self-pay | Admitting: Internal Medicine

## 2023-01-30 NOTE — Telephone Encounter (Signed)
Called pt inform has he received xray result he states no. Gave him MD response on xray. Pt states the pain does not feel like arthritis pain. He asked if MD could prescribe something for arthritis pain, and he will cancel appt for 02/04/23    Myrlene Broker, MD 01/30/2023 11:51 AM EDT     There is severe arthritis in the low back. Mild arthritis in the hips. Low back is likely the source of your pain.

## 2023-01-30 NOTE — Telephone Encounter (Signed)
Pt called and made another appt stating that he is still having pain on his right side. Pt was just seen on 5/24. Please advised.

## 2023-01-30 NOTE — Telephone Encounter (Signed)
Is he still having issues with constipation? During visit he mentioned having regular BM helped ease pain. What is he currently taking/trying for pain?

## 2023-02-02 NOTE — Telephone Encounter (Signed)
No constipation and patient is going to the bathroom regularly. Pt has made an appointment with MD to be seen.

## 2023-02-02 NOTE — Telephone Encounter (Signed)
Try tylenol [phone] mg TID for pain.

## 2023-02-03 NOTE — Telephone Encounter (Signed)
Per chart pt has made appt for 02/04/23.Marland KitchenRaechel Foster

## 2023-02-04 ENCOUNTER — Encounter: Payer: Self-pay | Admitting: Internal Medicine

## 2023-02-04 ENCOUNTER — Ambulatory Visit (INDEPENDENT_AMBULATORY_CARE_PROVIDER_SITE_OTHER): Payer: Medicare Other | Admitting: Internal Medicine

## 2023-02-04 VITALS — BP 140/80 | HR 76 | Temp 97.9°F | Ht 66.0 in | Wt 176.0 lb

## 2023-02-04 DIAGNOSIS — M25551 Pain in right hip: Secondary | ICD-10-CM | POA: Diagnosis not present

## 2023-02-04 MED ORDER — CYCLOBENZAPRINE HCL 5 MG PO TABS: 5.0000 mg | ORAL_TABLET | Freq: Two times a day (BID) | ORAL | 1 refills | Status: DC | PRN

## 2023-02-04 NOTE — Progress Notes (Unsigned)
   Subjective:   Patient ID: Andrew Foster, male    DOB: 1941-08-10, 82 y.o.   MRN: [id number]  HPI The patient is an 82 YO man coming in for right side/hip pain. Tylenol and tramadol and diclofenac not helping this pain but helps his other arthritis pain.  Review of Systems  Constitutional: Negative.   HENT: Negative.    Eyes: Negative.   Respiratory:  Negative for cough, chest tightness and shortness of breath.   Cardiovascular:  Negative for chest pain, palpitations and leg swelling.  Gastrointestinal:  Negative for abdominal distention, abdominal pain, constipation, diarrhea, nausea and vomiting.  Genitourinary:  Positive for flank pain.  Skin: Negative.   Neurological: Negative.   Psychiatric/Behavioral: Negative.      Objective:  Physical Exam Constitutional:      Appearance: He is well-developed.  HENT:     Head: Normocephalic and atraumatic.  Cardiovascular:     Rate and Rhythm: Normal rate and regular rhythm.  Pulmonary:     Effort: Pulmonary effort is normal. No respiratory distress.     Breath sounds: Normal breath sounds. No wheezing or rales.  Abdominal:     General: Bowel sounds are normal. There is no distension.     Palpations: Abdomen is soft.     Tenderness: There is abdominal tenderness. There is no rebound.  Musculoskeletal:     Cervical back: Normal range of motion.  Skin:    General: Skin is warm and dry.  Neurological:     Mental Status: He is alert and oriented to person, place, and time.     Coordination: Coordination normal.     Vitals:   02/04/23 1053  BP: (!) 140/80  Pulse: 76  Temp: 97.9 F (36.6 C)  TempSrc: Oral  SpO2: 93%  Weight: 176 lb (79.8 kg)  Height: 5\' 6"  (1.676 m)    Assessment & Plan:

## 2023-02-04 NOTE — Patient Instructions (Signed)
We have sent in flexeril (cyclobenzaprine) to take up to twice a day to help with the pain.

## 2023-02-05 NOTE — Assessment & Plan Note (Signed)
Rx flexeril 5 mg BID prn as I suspect he has a strained muscle in the area of the pain. He can continue diclofenac BID prn and tramadol daily and tylenol up to 3000 mg daily.

## 2023-02-19 DIAGNOSIS — M25551 Pain in right hip: Secondary | ICD-10-CM | POA: Diagnosis not present

## 2023-02-27 DIAGNOSIS — R262 Difficulty in walking, not elsewhere classified: Secondary | ICD-10-CM | POA: Diagnosis not present

## 2023-02-27 DIAGNOSIS — S76011D Strain of muscle, fascia and tendon of right hip, subsequent encounter: Secondary | ICD-10-CM | POA: Diagnosis not present

## 2023-02-27 DIAGNOSIS — M6281 Muscle weakness (generalized): Secondary | ICD-10-CM | POA: Diagnosis not present

## 2023-03-10 DIAGNOSIS — M6281 Muscle weakness (generalized): Secondary | ICD-10-CM | POA: Diagnosis not present

## 2023-03-10 DIAGNOSIS — R262 Difficulty in walking, not elsewhere classified: Secondary | ICD-10-CM | POA: Diagnosis not present

## 2023-03-10 DIAGNOSIS — S76011D Strain of muscle, fascia and tendon of right hip, subsequent encounter: Secondary | ICD-10-CM | POA: Diagnosis not present

## 2023-03-12 DIAGNOSIS — S76011D Strain of muscle, fascia and tendon of right hip, subsequent encounter: Secondary | ICD-10-CM | POA: Diagnosis not present

## 2023-03-12 DIAGNOSIS — R262 Difficulty in walking, not elsewhere classified: Secondary | ICD-10-CM | POA: Diagnosis not present

## 2023-03-12 DIAGNOSIS — M6281 Muscle weakness (generalized): Secondary | ICD-10-CM | POA: Diagnosis not present

## 2023-03-17 DIAGNOSIS — R262 Difficulty in walking, not elsewhere classified: Secondary | ICD-10-CM | POA: Diagnosis not present

## 2023-03-17 DIAGNOSIS — M6281 Muscle weakness (generalized): Secondary | ICD-10-CM | POA: Diagnosis not present

## 2023-03-17 DIAGNOSIS — S76011D Strain of muscle, fascia and tendon of right hip, subsequent encounter: Secondary | ICD-10-CM | POA: Diagnosis not present

## 2023-03-19 DIAGNOSIS — M6281 Muscle weakness (generalized): Secondary | ICD-10-CM | POA: Diagnosis not present

## 2023-03-19 DIAGNOSIS — S76011D Strain of muscle, fascia and tendon of right hip, subsequent encounter: Secondary | ICD-10-CM | POA: Diagnosis not present

## 2023-03-19 DIAGNOSIS — R262 Difficulty in walking, not elsewhere classified: Secondary | ICD-10-CM | POA: Diagnosis not present

## 2023-03-24 DIAGNOSIS — R262 Difficulty in walking, not elsewhere classified: Secondary | ICD-10-CM | POA: Diagnosis not present

## 2023-03-24 DIAGNOSIS — M6281 Muscle weakness (generalized): Secondary | ICD-10-CM | POA: Diagnosis not present

## 2023-03-24 DIAGNOSIS — S76011D Strain of muscle, fascia and tendon of right hip, subsequent encounter: Secondary | ICD-10-CM | POA: Diagnosis not present

## 2023-03-26 DIAGNOSIS — R262 Difficulty in walking, not elsewhere classified: Secondary | ICD-10-CM | POA: Diagnosis not present

## 2023-03-26 DIAGNOSIS — S76011D Strain of muscle, fascia and tendon of right hip, subsequent encounter: Secondary | ICD-10-CM | POA: Diagnosis not present

## 2023-03-26 DIAGNOSIS — M6281 Muscle weakness (generalized): Secondary | ICD-10-CM | POA: Diagnosis not present

## 2023-04-16 DIAGNOSIS — M1712 Unilateral primary osteoarthritis, left knee: Secondary | ICD-10-CM | POA: Diagnosis not present

## 2023-04-20 ENCOUNTER — Other Ambulatory Visit: Payer: Self-pay | Admitting: Internal Medicine

## 2023-05-06 DIAGNOSIS — R351 Nocturia: Secondary | ICD-10-CM | POA: Diagnosis not present

## 2023-05-06 DIAGNOSIS — R972 Elevated prostate specific antigen [PSA]: Secondary | ICD-10-CM | POA: Diagnosis not present

## 2023-05-06 DIAGNOSIS — N401 Enlarged prostate with lower urinary tract symptoms: Secondary | ICD-10-CM | POA: Diagnosis not present

## 2023-07-06 DIAGNOSIS — K439 Ventral hernia without obstruction or gangrene: Secondary | ICD-10-CM | POA: Diagnosis not present

## 2023-07-06 DIAGNOSIS — K4020 Bilateral inguinal hernia, without obstruction or gangrene, not specified as recurrent: Secondary | ICD-10-CM | POA: Diagnosis not present

## 2023-07-09 ENCOUNTER — Other Ambulatory Visit: Payer: Self-pay | Admitting: Internal Medicine

## 2023-07-22 DIAGNOSIS — K4020 Bilateral inguinal hernia, without obstruction or gangrene, not specified as recurrent: Secondary | ICD-10-CM | POA: Diagnosis not present

## 2023-07-22 DIAGNOSIS — D176 Benign lipomatous neoplasm of spermatic cord: Secondary | ICD-10-CM | POA: Diagnosis not present

## 2023-07-22 DIAGNOSIS — K4021 Bilateral inguinal hernia, without obstruction or gangrene, recurrent: Secondary | ICD-10-CM | POA: Diagnosis not present

## 2023-07-22 DIAGNOSIS — K439 Ventral hernia without obstruction or gangrene: Secondary | ICD-10-CM | POA: Diagnosis not present

## 2023-10-04 ENCOUNTER — Other Ambulatory Visit: Payer: Self-pay | Admitting: Internal Medicine

## 2023-10-28 ENCOUNTER — Other Ambulatory Visit: Payer: Self-pay | Admitting: Internal Medicine

## 2023-11-18 ENCOUNTER — Other Ambulatory Visit: Payer: Self-pay | Admitting: Internal Medicine

## 2023-12-18 DIAGNOSIS — I959 Hypotension, unspecified: Secondary | ICD-10-CM | POA: Diagnosis not present

## 2023-12-18 DIAGNOSIS — T59811A Toxic effect of smoke, accidental (unintentional), initial encounter: Secondary | ICD-10-CM | POA: Diagnosis not present

## 2023-12-18 DIAGNOSIS — T59814A Toxic effect of smoke, undetermined, initial encounter: Secondary | ICD-10-CM | POA: Diagnosis not present

## 2023-12-18 DIAGNOSIS — R0689 Other abnormalities of breathing: Secondary | ICD-10-CM | POA: Diagnosis not present

## 2023-12-18 DIAGNOSIS — X04XXXA Exposure to ignition of highly flammable material, initial encounter: Secondary | ICD-10-CM | POA: Diagnosis not present

## 2023-12-18 DIAGNOSIS — I10 Essential (primary) hypertension: Secondary | ICD-10-CM | POA: Diagnosis not present

## 2023-12-18 DIAGNOSIS — R0902 Hypoxemia: Secondary | ICD-10-CM | POA: Diagnosis not present

## 2023-12-31 ENCOUNTER — Other Ambulatory Visit: Payer: Self-pay | Admitting: Internal Medicine

## 2024-01-01 ENCOUNTER — Other Ambulatory Visit: Payer: Self-pay | Admitting: Internal Medicine

## 2024-01-15 ENCOUNTER — Other Ambulatory Visit: Payer: Self-pay | Admitting: Internal Medicine

## 2024-02-01 ENCOUNTER — Other Ambulatory Visit: Payer: Self-pay | Admitting: Internal Medicine

## 2024-02-03 ENCOUNTER — Ambulatory Visit: Payer: Self-pay

## 2024-02-03 NOTE — Telephone Encounter (Signed)
 FYI Only or Action Required?: FYI only for provider  Patient was last seen in primary care on 02/04/2023 by Adelia Homestead, MD. Called Nurse Triage reporting Diarrhea. Symptoms began several days ago. Interventions attempted: otc medications. Symptoms are: unchanged.  Triage Disposition: See Physician Within 24 Hours  Patient/caregiver understands and will follow disposition?: Yes   Copied from CRM [id number]. Topic: Clinical - Red Word Triage >> Feb 03, 2024  3:32 PM Shereese L wrote: [facility] that prompted transfer to Nurse Triage: patient states that he has uncontrollable diahrea Reason for Disposition  [1] MODERATE diarrhea (e.g., 4-6 times / day more than normal) AND [2] present > 48 hours (2 days)  Answer Assessment - Initial Assessment Questions 1. DIARRHEA SEVERITY: "How bad is the diarrhea?" "How many more stools have you had in the past 24 hours than normal?"    - NO DIARRHEA (SCALE 0)   - MILD (SCALE 1-3): Few loose or mushy BMs; increase of 1-3 stools over normal daily number of stools; mild increase in ostomy output.   -  MODERATE (SCALE 4-7): Increase of 4-6 stools daily over normal; moderate increase in ostomy output.   -  SEVERE (SCALE 8-10; OR "WORST POSSIBLE"): Increase of 7 or more stools daily over normal; moderate increase in ostomy output; incontinence.     Moderate up to 6 times 2. ONSET: "When did the diarrhea begin?"      6 days ago 3. BM CONSISTENCY: "How loose or watery is the diarrhea?"      watery 4. VOMITING: "Are you also vomiting?" If Yes, ask: "How many times in the past 24 hours?"      No 5. ABDOMEN PAIN: "Are you having any abdomen pain?" If Yes, ask: "What does it feel like?" (e.g., crampy, dull, intermittent, constant)      denies 6. ABDOMEN PAIN SEVERITY: If present, ask: "How bad is the pain?"  (e.g., Scale 1-10; mild, moderate, or severe)   - MILD (1-3): doesn't interfere with normal activities, abdomen soft and not tender to touch    -  MODERATE (4-7): interferes with normal activities or awakens from sleep, abdomen tender to touch    - SEVERE (8-10): excruciating pain, doubled over, unable to do any normal activities       denies 7. ORAL INTAKE: If vomiting, "Have you been able to drink liquids?" "How much liquids have you had in the past 24 hours?"     NA 8. HYDRATION: "Any signs of dehydration?" (e.g., dry mouth [not just dry lips], too weak to stand, dizziness, new weight loss) "When did you last urinate?"     No. Voiding as per usual 9. EXPOSURE: "Have you traveled to a foreign country recently?" "Have you been exposed to anyone with diarrhea?" "Could you have eaten any food that was spoiled?"     No travel, recently visited sister who is inpatient being treated for diarrhea 10. ANTIBIOTIC USE: "Are you taking antibiotics now or have you taken antibiotics in the past 2 months?"       no 11. OTHER SYMPTOMS: "Do you have any other symptoms?" (e.g., fever, blood in stool)       Denies  Pepto slowed it, then changed to kaeopectate which helped some. Still able to work on his care, but does experience bowel urgency.  Protocols used: [facility]

## 2024-02-04 ENCOUNTER — Ambulatory Visit (INDEPENDENT_AMBULATORY_CARE_PROVIDER_SITE_OTHER): Admitting: Internal Medicine

## 2024-02-04 ENCOUNTER — Encounter: Payer: Self-pay | Admitting: Internal Medicine

## 2024-02-04 ENCOUNTER — Telehealth: Payer: Self-pay

## 2024-02-04 VITALS — BP 126/78 | HR 67 | Temp 97.6°F | Ht 66.0 in | Wt 170.0 lb

## 2024-02-04 DIAGNOSIS — R7303 Prediabetes: Secondary | ICD-10-CM | POA: Diagnosis not present

## 2024-02-04 DIAGNOSIS — A09 Infectious gastroenteritis and colitis, unspecified: Secondary | ICD-10-CM | POA: Diagnosis not present

## 2024-02-04 DIAGNOSIS — M25562 Pain in left knee: Secondary | ICD-10-CM

## 2024-02-04 DIAGNOSIS — G8929 Other chronic pain: Secondary | ICD-10-CM | POA: Diagnosis not present

## 2024-02-04 MED ORDER — DIPHENOXYLATE-ATROPINE 2.5-0.025 MG PO TABS: 1.0000 | ORAL_TABLET | Freq: Four times a day (QID) | ORAL | 0 refills | Status: DC | PRN

## 2024-02-04 NOTE — Assessment & Plan Note (Signed)
 Lab Results  Component Value Date   HGBA1C 6.4 07/30/2022   Stable, pt to continue current medical treatment  - diet, wt control

## 2024-02-04 NOTE — Progress Notes (Signed)
 Patient ID: Andrew Foster, male   DOB: 08-17-1941, 83 y.o.   MRN: [id number]        Chief Complaint: follow up acute diarrhea       HPI:  Andrew Foster is a 83 y.o. male here with c/o loose stool new onset x 2 wks after visited sister in medical facility with same symptoms. He is not sure if she is improvedm but he has had no fever, chills, worsening thirst, dizziness, n/v, blood or abd pain.  Pt denies chest pain, increased sob or doe, wheezing, orthopnea, PND, increased LE swelling, palpitations, dizziness or syncope.   Pt denies polydipsia, polyuria, or new focal neuro s/s.          Wt Readings from Last 3 Encounters:  02/04/24 170 lb (77.1 kg)  02/04/23 176 lb (79.8 kg)  01/23/23 173 lb (78.5 kg)   BP Readings from Last 3 Encounters:  02/04/24 126/78  02/04/23 (!) 140/80  01/23/23 120/70         Past Medical History:  Diagnosis Date   Acute pyelonephritis 11/01/2016   Adenomatous polyps of colon 01/2017   no f/u - age   BPH (benign prostatic hyperplasia)    Diverticulosis    Hypertension    Past Surgical History:  Procedure Laterality Date   CARPAL TUNNEL RELEASE     COLONOSCOPY     Tics; [facility]   HERNIA REPAIR     intra articular steroids     R shoulder   PROSTATE BIOPSY     Dr Levi Real   VASECTOMY      reports that he quit smoking about 49 years ago. His smoking use included cigarettes. He has never used smokeless tobacco. He reports that he does not drink alcohol and does not use drugs. family history includes Diabetes in his brother, mother, and sister; Diabetes type I in his brother; Heart attack (age of onset: 65) in his mother. No Known Allergies Current Outpatient Medications on File Prior to Visit  Medication Sig Dispense Refill   aspirin  81 MG tablet Take 81 mg by mouth daily.     cyclobenzaprine  (FLEXERIL ) 5 MG tablet Take 1 tablet (5 mg total) by mouth 2 (two) times daily as needed for muscle spasms. 60 tablet 1   diclofenac  (VOLTAREN ) 75 MG EC tablet  Take 1 tablet by mouth twice daily 30 tablet 0   finasteride (PROSCAR) 5 MG tablet Take 5 mg by mouth daily.     glucosamine-chondroitin 500-400 MG tablet Take 1 tablet by mouth every other day.     meclizine  (ANTIVERT ) 12.5 MG tablet Take 1 tablet (12.5 mg total) by mouth 3 (three) times daily as needed for dizziness. 30 tablet 1   Multiple Vitamin (MULTIVITAMIN) tablet Take 1 tablet by mouth daily.     omeprazole  (PRILOSEC  OTC) 20 MG tablet Take 20 mg by mouth daily.     pravastatin  (PRAVACHOL ) 20 MG tablet Take 1 tablet by mouth once daily 90 tablet 0   tamsulosin  (FLOMAX ) 0.4 MG CAPS capsule Take 1 capsule by mouth once daily at bedtime 90 capsule 0   traMADol  (ULTRAM ) 50 MG tablet Take 1 tablet by mouth once daily 30 tablet 3   Vit B6-Vit B12-Omega 3 Acids (VITAMIN B PLUS+ PO) Take 1 tablet by mouth daily.     Vitamin D, Cholecalciferol, 1000 units CAPS Take 1 capsule by mouth daily.     No current facility-administered medications on file prior to visit.  ROS:  All others reviewed and negative.  Objective        PE:  BP 126/78 (BP Location: Left Arm, Patient Position: Sitting, Cuff Size: Normal)   Pulse 67   Temp 97.6 F (36.4 C) (Oral)   Ht 5\' 6"  (1.676 m)   Wt 170 lb (77.1 kg)   SpO2 97%   BMI 27.44 kg/m                 Constitutional: Pt appears in NAD, non toxic in no pain except for chronic left knee with walking with cane               HENT: Head: NCAT.                Right Ear: External ear normal.                 Left Ear: External ear normal.                Eyes: . Pupils are equal, round, and reactive to light. Conjunctivae and EOM are normal               Nose: without d/c or deformity               Neck: Neck supple. Gross normal ROM               Cardiovascular: Normal rate and regular rhythm.                 Pulmonary/Chest: Effort normal and breath sounds without rales or wheezing.                Abd:  Soft, NT, ND, + BS, no organomegaly                Neurological: Pt is alert. At baseline orientation, motor grossly intact               Skin: Skin is warm. No rashes, no other new lesions, LE edema - none               Psychiatric: Pt behavior is normal without agitation   Micro: none  Cardiac tracings I have personally interpreted today:  none  Pertinent Radiological findings (summarize): none   Lab Results  Component Value Date   WBC 14.8 (H) 10/18/2022   HGB 13.5 10/18/2022   HCT 39.3 10/18/2022   PLT 205 10/18/2022   GLUCOSE 116 (H) 10/16/2022   CHOL 166 07/30/2022   TRIG 286.0 (H) 07/30/2022   HDL 38.80 (L) 07/30/2022   LDLDIRECT 105.0 07/30/2022   LDLCALC 112 (H) 06/18/2018   ALT 30 10/16/2022   AST 39 10/16/2022   NA 135 10/16/2022   K 4.4 10/16/2022   CL 101 10/16/2022   CREATININE 0.91 10/16/2022   BUN 19 10/16/2022   CO2 23 10/16/2022   TSH 2.28 02/01/2015   PSA 4.91 (H) 07/30/2022   INR 1.2 10/16/2022   HGBA1C 6.4 07/30/2022   MICROALBUR 0.9 02/08/2007   Assessment/Plan:  Andrew Foster is a 83 y.o. White or Caucasian [1] male with  has a past medical history of Acute pyelonephritis (11/01/2016), Adenomatous polyps of colon (01/2017), BPH (benign prostatic hyperplasia), Diverticulosis, and Hypertension.  Diarrhea of infectious origin Recent onset fortunately without severe other associated symptoms, but seems most likely infectious given recent visit with sister with same;  he should ask if she required treatment or resolved, but also for lomotill prn, and  to check GI panel r/o c diff and other  Pre-diabetes Lab Results  Component Value Date   HGBA1C 6.4 07/30/2022   Stable, pt to continue current medical treatment  - diet, wt control   Left knee pain  Chronic stable, pt has deferred TKR for now, no recent falls, cont cane and f/u ortho as planned  Followup: Return if symptoms worsen or fail to improve.  Rosalia Colonel, MD 02/04/2024 11:41 AM [facility]

## 2024-02-04 NOTE — Assessment & Plan Note (Signed)
 Recent onset fortunately without severe other associated symptoms, but seems most likely infectious given recent visit with sister with same;  he should ask if she required treatment or resolved, but also for lomotill prn, and to check GI panel r/o c diff and other

## 2024-02-04 NOTE — Telephone Encounter (Signed)
 Patient medication has been approved as of today until June 5th 2026

## 2024-02-04 NOTE — Patient Instructions (Signed)
 Please take all new medication as prescribed- the lomotil for diarrhea  Please continue all other medications as before, and refills have been done if requested.  Please have the pharmacy call with any other refills you may need.  Please keep your appointments with your specialists as you may have planned  Please go to the LAB at the blood drawing area for the tests to be done - just the stool testing today  You will be contacted by phone if any changes need to be made immediately.  Otherwise, you will receive a letter about your results with an explanation, but please check with MyChart first.

## 2024-02-04 NOTE — Assessment & Plan Note (Signed)
 Chronic stable, pt has deferred TKR for now, no recent falls, cont cane and f/u ortho as planned

## 2024-02-04 NOTE — Telephone Encounter (Signed)
 Pharmacy Patient Advocate Encounter   Received notification from CoverMyMeds that prior authorization for Diphenoxylate-Atropine 2.5-0.025MG  tablets is required/requested.   Insurance verification completed.   The patient is insured through [employer] .   Per test claim: PA required; PA submitted to above mentioned insurance via CoverMyMeds Key/confirmation #/EOC [id number] Status is pending

## 2024-02-05 ENCOUNTER — Other Ambulatory Visit

## 2024-02-05 DIAGNOSIS — A09 Infectious gastroenteritis and colitis, unspecified: Secondary | ICD-10-CM | POA: Diagnosis not present

## 2024-02-10 ENCOUNTER — Ambulatory Visit: Payer: Self-pay | Admitting: Internal Medicine

## 2024-02-10 ENCOUNTER — Telehealth: Payer: Self-pay | Admitting: Internal Medicine

## 2024-02-10 LAB — GASTROINTESTINAL PATHOGEN PNL
CampyloBacter Group: NOT DETECTED
Norovirus GI/GII: NOT DETECTED
Rotavirus A: NOT DETECTED
Salmonella species: NOT DETECTED
Shiga Toxin 1: NOT DETECTED
Shiga Toxin 2: NOT DETECTED
Shigella Species: NOT DETECTED
Vibrio Group: NOT DETECTED
Yersinia enterocolitica: NOT DETECTED

## 2024-02-10 NOTE — Telephone Encounter (Signed)
 Called and let Pt know Dr.John is out of the office this week and his labs hasn't been resulted, We will give him call once his labs have been resulted.

## 2024-02-10 NOTE — Telephone Encounter (Signed)
 Copied from CRM [id number]. Topic: Clinical - Lab/Test Results >> Feb 10, 2024  1:06 PM Alyse July wrote: Reason for CRM: Patient would like a call back to discuss lab results, unable to access MyChart presently. EA#[phone]

## 2024-02-15 ENCOUNTER — Other Ambulatory Visit (HOSPITAL_COMMUNITY): Payer: Self-pay

## 2024-02-15 ENCOUNTER — Telehealth: Payer: Self-pay | Admitting: Internal Medicine

## 2024-02-15 NOTE — Telephone Encounter (Signed)
 Pharmacy Patient Advocate Encounter  Received notification from [facility] that Prior Authorization for Diphenoxylate -Atropine  2.5-0.025MG  tablets has been APPROVED from 02/04/2024 to 02/03/2025. Ran test claim, Copay is $10.43. This test claim was processed through [facility]- copay amounts may vary at other pharmacies due to pharmacy/plan contracts, or as the patient moves through the different stages of their insurance plan.   PA #/Case ID/Reference #: [id number]

## 2024-02-15 NOTE — Telephone Encounter (Unsigned)
 Copied from CRM [id number]. Topic: Clinical - Medication Refill >> Feb 15, 2024 10:01 AM Tanazia G wrote: Medication: diphenoxylate -atropine  (LOMOTIL ) 2.5-0.025 MG tablet Patient states that he has taken all 28 pills and he is not feeling better, still having same symptoms.   Has the patient contacted their pharmacy? Yes (Agent: If no, request that the patient contact the pharmacy for the refill. If patient does not wish to contact the pharmacy document the reason why and proceed with request.) (Agent: If yes, when and what did the pharmacy advise?)  This is the patient's preferred pharmacy:  [facility] [address], [place] - [address] [facility] [place] [address] Phone: [phone] Fax: [phone]  Is this the correct pharmacy for this prescription? Yes If no, delete pharmacy and type the correct one.   Has the prescription been filled recently? Yes  Is the patient out of the medication? Yes  Has the patient been seen for an appointment in the last year OR does the patient have an upcoming appointment? Yes  Can we respond through MyChart? Yes  Agent: Please be advised that Rx refills may take up to 3 business days. We ask that you follow-up with your pharmacy.

## 2024-02-15 NOTE — Telephone Encounter (Signed)
 Copied from CRM [id number]. Topic: Clinical - Medical Advice >> Feb 15, 2024  9:59 AM Marlan Silva wrote: Reason for CRM: Patient called in stating that he is still having a very soft stool, and it is exactly how it was 7 days ago. Patient states that he has to use the restroom every 2-4 hours. Patient is requesting a call back at [phone].

## 2024-02-16 NOTE — Telephone Encounter (Signed)
**Note De-identified  [place] Obfuscation** Please advise 

## 2024-02-16 NOTE — Telephone Encounter (Signed)
 Called and Lvm for patient to get scheduled with any provider in office pt did not answer

## 2024-02-17 ENCOUNTER — Ambulatory Visit (INDEPENDENT_AMBULATORY_CARE_PROVIDER_SITE_OTHER)

## 2024-02-17 VITALS — Ht 66.0 in | Wt 170.0 lb

## 2024-02-17 DIAGNOSIS — Z0000 Encounter for general adult medical examination without abnormal findings: Secondary | ICD-10-CM | POA: Diagnosis not present

## 2024-02-17 NOTE — Patient Instructions (Signed)
 [name] , Thank you for taking time out of your busy schedule to complete your Annual Wellness Visit with me. I enjoyed our conversation and look forward to speaking with you again next year. I, as well as your care team,  appreciate your ongoing commitment to your health goals. Please review the following plan we discussed and let me know if I can assist you in the future. Your Game plan/ To Do List    Follow up Visits: Next Medicare AWV with our clinical staff: 02/17/2025.   Have you seen your provider in the last 6 months (3 months if uncontrolled diabetes)? Yes Next Office Visit with your provider: 02/18/2024.  Clinician Recommendations:  Aim for 30 minutes of exercise or brisk walking, 6-8 glasses of water, and 5 servings of fruits and vegetables each day. You are due for a tetanus vaccine and can get that done at your local pharmacy.        This is a list of the screening recommended for you and due dates:  Health Maintenance  Topic Date Due   DTaP/Tdap/Td vaccine (2 - Tdap) 04/22/2022   COVID-19 Vaccine (4 - 2024-25 season) 05/03/2023   Medicare Annual Wellness Visit  01/23/2024   Flu Shot  04/01/2024   Pneumococcal Vaccine for age over 25  Completed   Zoster (Shingles) Vaccine  Completed   HPV Vaccine  Aged Out   Meningitis B Vaccine  Aged Out    Advanced directives: (Copy Requested) Please bring a copy of your health care power of attorney and living will to the office to be added to your chart at your convenience. You can mail to [facility] [address] [place], [place] [address] or email to [email or web address] Advance Care Planning is important because it:  [x]  Makes sure you receive the medical care that is consistent with your values, goals, and preferences  [x]  It provides guidance to your family and loved ones and reduces their decisional burden about whether or not they are making the right decisions based on your wishes.  Follow the link  provided in your after visit summary or read over the paperwork we have mailed to you to help you started getting your Advance Directives in place. If you need assistance in completing these, please reach out to us  so that we can help you!  See attachments for Preventive Care and Fall Prevention Tips.

## 2024-02-17 NOTE — Progress Notes (Signed)
 Subjective:   Andrew Foster is a 83 y.o. who presents for a Medicare Wellness preventive visit.  As a reminder, Annual Wellness Visits don't include a physical exam, and some assessments may be limited, especially if this visit is performed virtually. We may recommend an in-person follow-up visit with your provider if needed.  Visit Complete: Virtual I connected with  Andrew Foster on 02/17/24 by a audio enabled telemedicine application and verified that I am speaking with the correct person using two identifiers.  Patient Location: Home  Provider Location: Home Office  I discussed the limitations of evaluation and management by telemedicine. The patient expressed understanding and agreed to proceed.  Vital Signs: Because this visit was a virtual/telehealth visit, some criteria may be missing or patient reported. Any vitals not documented were not able to be obtained and vitals that have been documented are patient reported.  VideoDeclined- This patient declined Librarian, academic. Therefore the visit was completed with audio only.  Persons Participating in Visit: Patient.  AWV Questionnaire: No: Patient Medicare AWV questionnaire was not completed prior to this visit.  Cardiac Risk Factors include: advanced age (>55men, >50 women);male gender;dyslipidemia     Objective:    Today's Vitals   02/17/24 1344  Weight: 170 lb (77.1 kg)  Height: 5' 6 (1.676 m)   Body mass index is 27.44 kg/m.     02/17/2024    1:51 PM 01/23/2023    1:40 PM 10/16/2022   12:00 AM 10/15/2022    5:05 PM 12/19/2021    9:24 AM 12/18/2020    9:37 AM 12/19/2019    9:50 AM  Advanced Directives  Does Patient Have a Medical Advance Directive? Yes Yes Yes No Yes Yes Yes  Type of Estate agent of [place];Living will Healthcare Power of [place];Living will Healthcare Power of Attorney  Living will;Healthcare Power of Attorney Living will;Healthcare Power of  [employer] Power of [place];Living will  Does patient want to make changes to medical advance directive?   No - Patient declined  No - Patient declined No - Patient declined No - Patient declined  Copy of Healthcare Power of Attorney in Chart? No - copy requested  No - copy requested  No - copy requested No - copy requested No - copy requested    Current Medications (verified) Outpatient Encounter Medications as of 02/17/2024  Medication Sig   aspirin  81 MG tablet Take 81 mg by mouth daily.   cyclobenzaprine  (FLEXERIL ) 5 MG tablet Take 1 tablet (5 mg total) by mouth 2 (two) times daily as needed for muscle spasms.   diclofenac  (VOLTAREN ) 75 MG EC tablet Take 1 tablet by mouth twice daily   diphenoxylate -atropine  (LOMOTIL ) 2.5-0.025 MG tablet Take 1 tablet by mouth 4 (four) times daily as needed for diarrhea or loose stools.   finasteride (PROSCAR) 5 MG tablet Take 5 mg by mouth daily.   glucosamine-chondroitin 500-400 MG tablet Take 1 tablet by mouth every other day.   meclizine  (ANTIVERT ) 12.5 MG tablet Take 1 tablet (12.5 mg total) by mouth 3 (three) times daily as needed for dizziness.   Multiple Vitamin (MULTIVITAMIN) tablet Take 1 tablet by mouth daily.   omeprazole  (PRILOSEC  OTC) 20 MG tablet Take 20 mg by mouth daily.   pravastatin  (PRAVACHOL ) 20 MG tablet Take 1 tablet by mouth once daily   tamsulosin  (FLOMAX ) 0.4 MG CAPS capsule Take 1 capsule by mouth once daily at bedtime   traMADol  (ULTRAM ) 50 MG tablet  Take 1 tablet by mouth once daily   Vit B6-Vit B12-Omega 3 Acids (VITAMIN B PLUS+ PO) Take 1 tablet by mouth daily.   Vitamin D, Cholecalciferol, 1000 units CAPS Take 1 capsule by mouth daily.   No facility-administered encounter medications on file as of 02/17/2024.    Allergies (verified) Patient has no known allergies.   History: Past Medical History:  Diagnosis Date   Acute pyelonephritis 11/01/2016   Adenomatous polyps of colon 01/2017   no f/u - age   BPH  (benign prostatic hyperplasia)    Diverticulosis    Hypertension    Past Surgical History:  Procedure Laterality Date   CARPAL TUNNEL RELEASE     COLONOSCOPY     Tics; [facility]   HERNIA REPAIR     intra articular steroids     R shoulder   PROSTATE BIOPSY     Dr Levi Real   VASECTOMY     Family History  Problem Relation Age of Onset   Heart attack Mother 49   Diabetes Mother    Diabetes Sister        TWO sisters   Diabetes Brother        TWO brothers   Diabetes type I Brother    Stroke Neg Hx    Cancer Neg Hx    Colon cancer Neg Hx    Social History   Socioeconomic History   Marital status: Widowed    Spouse name: Not on file   Number of children: 4   Years of education: Not on file   Highest education level: Not on file  Occupational History   Occupation: RETIRED  Tobacco Use   Smoking status: Former    Current packs/day: 0.00    Types: Cigarettes    Quit date: 09/01/1974    Years since quitting: 49.4   Smokeless tobacco: Never   Tobacco comments:    smoked 1956-1976, up to 3 ppd  Vaping Use   Vaping status: Never Used  Substance and Sexual Activity   Alcohol use: No   Drug use: No   Sexual activity: Not Currently  Other Topics Concern   Not on file  Social History Narrative   Lives alone has a 4 dogs/2025   Social Drivers of Health   Financial Resource Strain: Low Risk  (02/17/2024)   Overall Financial Resource Strain (CARDIA)    Difficulty of Paying Living Expenses: Not hard at all  Food Insecurity: No Food Insecurity (02/17/2024)   Hunger Vital Sign    Worried About Running Out of Food in the Last Year: Never true    Ran Out of Food in the Last Year: Never true  Transportation Needs: No Transportation Needs (02/17/2024)   PRAPARE - Administrator, Civil Service (Medical): No    Lack of Transportation (Non-Medical): No  Physical Activity: Inactive (02/17/2024)   Exercise Vital Sign    Days of Exercise per Week: 0 days    Minutes of  Exercise per Session: 0 min  Stress: No Stress Concern Present (02/17/2024)   [employer] of Occupational Health - Occupational Stress Questionnaire    Feeling of Stress: Not at all  Social Connections: Socially Isolated (02/17/2024)   Social Connection and Isolation Panel    Frequency of Communication with Friends and Family: More than three times a week    Frequency of Social Gatherings with Friends and Family: Three times a week    Attends Religious Services: Never    Active Member  of Clubs or Organizations: No    Attends Banker Meetings: Never    Marital Status: Widowed    Tobacco Counseling Counseling given: Not Answered Tobacco comments: smoked 1956-1976, up to 3 ppd    Clinical Intake:  Pre-visit preparation completed: Yes  Pain : No/denies pain     BMI - recorded: 27.44 Nutritional Status: BMI 25 -29 Overweight Nutritional Risks: Nausea/ vomitting/ diarrhea (diarrhea) Diabetes: No  Lab Results  Component Value Date   HGBA1C 6.4 07/30/2022   HGBA1C 6.2 07/31/2021   HGBA1C 6.2 07/19/2020     How often do you need to have someone help you when you read instructions, pamphlets, or other written materials from your doctor or pharmacy?: 1 - Never  Interpreter Needed?: No  Information entered by :: Symeon Puleo, RMA   Activities of Daily Living     02/17/2024    1:49 PM  In your present state of health, do you have any difficulty performing the following activities:  Hearing? 0  Vision? 0  Difficulty concentrating or making decisions? 0  Walking or climbing stairs? 0  Dressing or bathing? 0  Doing errands, shopping? 0  Preparing Food and eating ? N  Using the Toilet? N  In the past six months, have you accidently leaked urine? Y  Do you have problems with loss of bowel control? N  Managing your Medications? N  Managing your Finances? N  Housekeeping or managing your Housekeeping? N    Patient Care Team: Adelia Homestead, MD  as PCP - General (Internal Medicine) Silverio Drought, MD as Consulting Physician (Ophthalmology) Szabat, Tino Foreman, [employer] (Inactive) as Pharmacist (Pharmacist)  I have updated your Care Teams any recent Medical Services you may have received from other providers in the past year.     Assessment:   This is a routine wellness examination for Andrew Foster.  Hearing/Vision screen Hearing Screening - Comments:: Denies hearing difficulties   Vision Screening - Comments:: Wears eyeglasses/    Goals Addressed             This Visit's Progress    Patient Stated   On track    Stay physically active, eat healthy, and enjoy my family and dogs.       Depression Screen     02/17/2024    1:54 PM 02/04/2024   11:04 AM 02/04/2023   10:56 AM 01/23/2023    1:43 PM 01/23/2023    1:41 PM 10/28/2022   10:50 AM 07/30/2022    9:56 AM  PHQ 2/9 Scores  PHQ - 2 Score 0 0 0 0 0 0 0  PHQ- 9 Score 1     0 0    Fall Risk     02/17/2024    1:52 PM 02/04/2024   11:12 AM 02/04/2023   10:56 AM 01/23/2023    1:41 PM 01/23/2023    8:13 AM  Fall Risk   Falls in the past year? 0 0 0 0 0  Number falls in past yr: 0 0 0 0 0  Injury with Fall? 0 0 0 0 0  Risk for fall due to :  No Fall Risks No Fall Risks No Fall Risks   Follow up Falls evaluation completed;Falls prevention discussed Falls evaluation completed  Falls evaluation completed Falls evaluation completed    MEDICARE RISK AT HOME:  Medicare Risk at Home Any stairs in or around the home?: Yes (3 in back and 1 step in front) If so, are  there any without handrails?: No Home free of loose throw rugs in walkways, pet beds, electrical cords, etc?: Yes Adequate lighting in your home to reduce risk of falls?: Yes Life alert?: No Use of a cane, walker or w/c?: Yes (cane) Grab bars in the bathroom?: No Shower chair or bench in shower?: No Elevated toilet seat or a handicapped toilet?: No  TIMED UP AND GO:  Was the test performed?  No  Cognitive  Function: Declined/Normal: No cognitive concerns noted by patient or family. Patient alert, oriented, able to answer questions appropriately and recall recent events. No signs of memory loss or confusion.    12/15/2017    9:30 AM  MMSE - Mini Mental State Exam  Orientation to time 5  Orientation to Place 5  Registration 3  Attention/ Calculation 5  Recall 1  Language- name 2 objects 2  Language- repeat 1  Language- follow 3 step command 3  Language- read & follow direction 1  Write a sentence 1  Copy design 1  Total score 28        01/23/2023    1:50 PM 12/19/2021    9:26 AM 12/19/2019    9:52 AM  6CIT Screen  What Year? 0 points 0 points 0 points  What month? 0 points 0 points 0 points  What time? 0 points 0 points 0 points  Count back from 20 0 points 0 points 0 points  Months in reverse 0 points 0 points 0 points  Repeat phrase 0 points 0 points 0 points  Total Score 0 points 0 points 0 points    Immunizations Immunization History  Administered Date(s) Administered   Fluad Quad(high Dose 65+) 06/07/2019, 07/19/2020, 07/31/2021, 07/30/2022   Influenza Whole 07/13/2007   Influenza, High Dose Seasonal PF 06/09/2017, 08/10/2018   Influenza,inj,Quad PF,6+ Mos 05/01/2016   Influenza-Unspecified 10/03/2015, 08/10/2018   Moderna Sars-Covid-2 Vaccination 09/26/2019, 10/21/2019, 07/31/2020   Pneumococcal Conjugate-13 12/10/2016   Pneumococcal Polysaccharide-23 10/03/2015   Td 04/22/2012   Zoster Recombinant(Shingrix) 06/12/2022, 07/31/2023    Screening Tests Health Maintenance  Topic Date Due   DTaP/Tdap/Td (2 - Tdap) 04/22/2022   COVID-19 Vaccine (4 - 2024-25 season) 05/03/2023   Medicare Annual Wellness (AWV)  01/23/2024   INFLUENZA VACCINE  04/01/2024   Pneumococcal Vaccine: 50+ Years  Completed   Zoster Vaccines- Shingrix  Completed   HPV VACCINES  Aged Out   Meningococcal B Vaccine  Aged Out    Health Maintenance  Health Maintenance Due  Topic Date Due    DTaP/Tdap/Td (2 - Tdap) 04/22/2022   COVID-19 Vaccine (4 - 2024-25 season) 05/03/2023   Medicare Annual Wellness (AWV)  01/23/2024   Health Maintenance Items Addressed: See Nurse Notes at the end of this note  Additional Screening:  Vision Screening: Recommended annual ophthalmology exams for early detection of glaucoma and other disorders of the eye. Would you like a referral to an eye doctor? No    Dental Screening: Recommended annual dental exams for proper oral hygiene  Community Resource Referral / Chronic Care Management: CRR required this visit?  No   CCM required this visit?  No   Plan:    I have personally reviewed and noted the following in the patient's chart:   Medical and social history Use of alcohol, tobacco or illicit drugs  Current medications and supplements including opioid prescriptions. Patient is not currently taking opioid prescriptions. Functional ability and status Nutritional status Physical activity Advanced directives List of other physicians Hospitalizations, surgeries, and  ER visits in previous 12 months Vitals Screenings to include cognitive, depression, and falls Referrals and appointments  In addition, I have reviewed and discussed with patient certain preventive protocols, quality metrics, and best practice recommendations. A written personalized care plan for preventive services as well as general preventive health recommendations were provided to patient.   Andrew Foster, CMA   02/17/2024   After Visit Summary: (MyChart) Due to this being a telephonic visit, the after visit summary with patients personalized plan was offered to patient via MyChart   Notes: Patient is due for a Tdap.  He had no other concerns to address today.  Patient has an office visit 02/18/24, for issues with diarrhea

## 2024-02-18 ENCOUNTER — Encounter: Payer: Self-pay | Admitting: Emergency Medicine

## 2024-02-18 ENCOUNTER — Ambulatory Visit: Admitting: Emergency Medicine

## 2024-02-18 VITALS — BP 118/68 | HR 70 | Temp 98.7°F | Ht 66.0 in | Wt 172.0 lb

## 2024-02-18 DIAGNOSIS — R197 Diarrhea, unspecified: Secondary | ICD-10-CM | POA: Diagnosis not present

## 2024-02-18 DIAGNOSIS — K909 Intestinal malabsorption, unspecified: Secondary | ICD-10-CM | POA: Diagnosis not present

## 2024-02-18 NOTE — Assessment & Plan Note (Signed)
 Clinically stable.  No red flag signs or symptoms. Benign abdominal examination.  Afebrile with normal vital signs Medication list reviewed with patient.  No new medications. No recent changes in doses. Recent GI pathogen panel was negative. Last colonoscopy 2017 showed diverticulosis and a couple of small polyps Differential diagnosis discussed with patient Most likely secondary to recent introduction of probiotics Advised to stop probiotics. Also recently started taking protein shakes.  Advised to stop them also. Recommend GI evaluation.  Referral placed today May need repeat colonoscopy. ED precautions given Advised to follow-up with PCP and contact the office if no better or worse during the next several days or weeks.

## 2024-02-18 NOTE — Patient Instructions (Signed)
 Diarrhea, Adult Diarrhea is when you pass loose and sometimes watery poop (stool) often. Diarrhea can make you feel weak and cause you to lose water in your body (get dehydrated). Losing water in your body can cause you to: Feel tired and thirsty. Have a dry mouth. Go pee (urinate) less often. Diarrhea often lasts 2-3 days. It can last longer if it is a sign of something more serious. Be sure to treat your diarrhea as told by your doctor. Follow these instructions at home: Eating and drinking     Follow these instructions as told by your doctor: Take an ORS (oral rehydration solution). This is a drink that helps you replace fluids and minerals your body lost. It is sold at pharmacies and stores. Drink enough fluid to keep your pee (urine) pale yellow. Drink fluids such as: Water. You can also get fluids by sucking on ice chips. Diluted fruit juice. Low-calorie sports drinks. Milk. Avoid drinking fluids that have a lot of sugar or caffeine in them. These include soda, energy drinks, and regular sports drinks. Avoid alcohol. Eat bland, easy-to-digest foods in small amounts as you are able. These foods include: Bananas. Applesauce. Rice. Low-fat (lean) meats. Toast. Crackers. Avoid spicy or fatty foods.  Medicines Take over-the-counter and prescription medicines only as told by your doctor. If you were prescribed antibiotics, take them as told by your doctor. Do not stop taking them even if you start to feel better. General instructions  Wash your hands often using soap and water for 20 seconds. If soap and water are not available, use hand sanitizer. Others in your home should wash their hands as well. Wash your hands: After using the toilet or changing a diaper. Before preparing, cooking, or serving food. While caring for a sick person. While visiting someone in a hospital. Rest at home while you get better. Take a warm bath to help with any burning or pain from having  diarrhea. Watch your condition for any changes. Contact a doctor if: You have a fever. Your diarrhea gets worse. You have new symptoms. You vomit every time you eat or drink. You feel light-headed, dizzy, or you have a headache. You have muscle cramps. You have signs of losing too much water in your body, such as: Dark pee, very little pee, or no pee. Cracked lips. Dry mouth. Sunken eyes. Sleepiness. Weakness. You have bloody or black poop or poop that looks like tar. You have very bad pain, cramping, or bloating in your belly (abdomen). Your skin feels cold and clammy. You feel confused. Get help right away if: You have chest pain. Your heart is beating very quickly. You have trouble breathing or you are breathing very quickly. You feel very weak or you faint. These symptoms may be an emergency. Get help right away. Call 911. Do not wait to see if the symptoms will go away. Do not drive yourself to the hospital. This information is not intended to replace advice given to you by your health care provider. Make sure you discuss any questions you have with your health care provider. Document Revised: 02/04/2022 Document Reviewed: 02/04/2022 Elsevier Patient Education  2024 [employer].

## 2024-02-18 NOTE — Progress Notes (Unsigned)
 Andrew Foster 83 y.o.   Chief Complaint  Patient presents with  . Diarrhea    Patient states he's been having really loose stool not quite diarrhea. Patient does have a picture of his stool. No blood, no pain, no bloating, no abdominal pain, no nausea, appetite still the same. he states he goes 2-3 times during the day and 2-3 times at night.     HISTORY OF PRESENT ILLNESS: Acute problem visit today. This is a 83 y.o. male complaining of soft nonbloody frequent bowel movements with oatmeal consistency 2-3 times during the day and 2-3 times during the night Was recently seen by Dr. Autry Legions on 02/04/2024.  GI panel was negative for bacteria and viruses.  Negative C. difficile. Last colonoscopy in 2017 showed diverticulosis and a couple small polyps Denies fever, nausea or vomiting, or abdominal pain. No other complaint or medical concerns today. Prior to symptom development he fairly recently started taking probiotics and has also been taking protein shakes No new medications or any other possible triggers.  Diarrhea  Pertinent negatives include no abdominal pain, chills, coughing, fever, headaches or vomiting.     Prior to Admission medications   Medication Sig Start Date End Date Taking? Authorizing Provider  aspirin  81 MG tablet Take 81 mg by mouth daily.   Yes [provider]  cyclobenzaprine  (FLEXERIL ) 5 MG tablet Take 1 tablet (5 mg total) by mouth 2 (two) times daily as needed for muscle spasms. 02/04/23  Yes Adelia Homestead, MD  diclofenac  (VOLTAREN ) 75 MG EC tablet Take 1 tablet by mouth twice daily 01/19/24  Yes Adelia Homestead, MD  finasteride (PROSCAR) 5 MG tablet Take 5 mg by mouth daily.   Yes [provider]  glucosamine-chondroitin 500-400 MG tablet Take 1 tablet by mouth every other day.   Yes [provider]  meclizine  (ANTIVERT ) 12.5 MG tablet Take 1 tablet (12.5 mg total) by mouth 3 (three) times daily as needed for dizziness. 10/16/20   Yes Adelia Homestead, MD  Multiple Vitamin (MULTIVITAMIN) tablet Take 1 tablet by mouth daily.   Yes [provider]  omeprazole  (PRILOSEC  OTC) 20 MG tablet Take 20 mg by mouth daily.   Yes [provider]  pravastatin  (PRAVACHOL ) 20 MG tablet Take 1 tablet by mouth once daily 12/31/23  Yes Adelia Homestead, MD  tamsulosin  (FLOMAX ) 0.4 MG CAPS capsule Take 1 capsule by mouth once daily at bedtime 12/31/23  Yes Adelia Homestead, MD  traMADol  (ULTRAM ) 50 MG tablet Take 1 tablet by mouth once daily 10/29/23  Yes Adelia Homestead, MD  Vit B6-Vit B12-Omega 3 Acids (VITAMIN B PLUS+ PO) Take 1 tablet by mouth daily.   Yes [provider]  Vitamin D, Cholecalciferol, 1000 units CAPS Take 1 capsule by mouth daily.   Yes [provider]  diphenoxylate -atropine  (LOMOTIL ) 2.5-0.025 MG tablet Take 1 tablet by mouth 4 (four) times daily as needed for diarrhea or loose stools. Patient not taking: Reported on 02/18/2024 02/04/24   Roslyn Coombe, MD    No Known Allergies  Patient Active Problem List   Diagnosis Date Noted  . Diarrhea of infectious origin 02/04/2024  . Right hip pain 01/23/2023  . Enlarged prostate 10/28/2022  . Bilateral recurrent inguinal hernia without obstruction or gangrene 10/28/2022  . Baker's cyst, left 12/24/2021  . Left knee pain 10/25/2021  . BPPV (benign paroxysmal positional vertigo) 10/16/2020  . Arthritis 06/18/2018  . Ventral hernia 06/10/2017  . Routine general  medical examination at a health care facility 09/14/2015  . Elevated alkaline phosphatase level 10/19/2014  . Mixed hyperlipidemia 02/28/2013  . Lipoma of back 11/22/2009  . ELEVATED PROSTATE SPECIFIC ANTIGEN 11/06/2008  . Esophageal reflux 05/01/2008  . Pre-diabetes 05/01/2008    Past Medical History:  Diagnosis Date  . Acute pyelonephritis 11/01/2016  . Adenomatous polyps of colon 01/2017   no f/u - age  . BPH (benign prostatic hyperplasia)   .  Diverticulosis   . Hypertension     Past Surgical History:  Procedure Laterality Date  . CARPAL TUNNEL RELEASE    . COLONOSCOPY     Tics; [facility]  . HERNIA REPAIR    . intra articular steroids     R shoulder  . PROSTATE BIOPSY     Dr Levi Real  . VASECTOMY      Social History   Socioeconomic History  . Marital status: Widowed    Spouse name: Not on file  . Number of children: 4  . Years of education: Not on file  . Highest education level: Not on file  Occupational History  . Occupation: RETIRED  Tobacco Use  . Smoking status: Former    Current packs/day: 0.00    Types: Cigarettes    Quit date: 09/01/1974    Years since quitting: 49.4  . Smokeless tobacco: Never  . Tobacco comments:    smoked 1956-1976, up to 3 ppd  Vaping Use  . Vaping status: Never Used  Substance and Sexual Activity  . Alcohol use: No  . Drug use: No  . Sexual activity: Not Currently  Other Topics Concern  . Not on file  Social History Narrative   Lives alone has a 4 dogs/2025   Social Drivers of Health   Financial Resource Strain: Low Risk  (02/17/2024)   Overall Financial Resource Strain (CARDIA)   . Difficulty of Paying Living Expenses: Not hard at all  Food Insecurity: No Food Insecurity (02/17/2024)   Hunger Vital Sign   . Worried About Programme researcher, broadcasting/film/video in the Last Year: Never true   . Ran Out of Food in the Last Year: Never true  Transportation Needs: No Transportation Needs (02/17/2024)   PRAPARE - Transportation   . Lack of Transportation (Medical): No   . Lack of Transportation (Non-Medical): No  Physical Activity: Inactive (02/17/2024)   Exercise Vital Sign   . Days of Exercise per Week: 0 days   . Minutes of Exercise per Session: 0 min  Stress: No Stress Concern Present (02/17/2024)   [employer] of Occupational Health - Occupational Stress Questionnaire   . Feeling of Stress: Not at all  Social Connections: Socially Isolated (02/17/2024)   Social Connection and  Isolation Panel   . Frequency of Communication with Friends and Family: More than three times a week   . Frequency of Social Gatherings with Friends and Family: Three times a week   . Attends Religious Services: Never   . Active Member of Clubs or Organizations: No   . Attends Banker Meetings: Never   . Marital Status: Widowed  Intimate Partner Violence: Patient Unable To Answer (02/17/2024)   Humiliation, Afraid, Rape, and Kick questionnaire   . Fear of Current or Ex-Partner: Patient unable to answer   . Emotionally Abused: Patient unable to answer   . Physically Abused: Patient unable to answer   . Sexually Abused: Patient unable to answer    Family History  Problem Relation Age of Onset  .  Heart attack Mother 53  . Diabetes Mother   . Diabetes Sister        TWO sisters  . Diabetes Brother        TWO brothers  . Diabetes type I Brother   . Stroke Neg Hx   . Cancer Neg Hx   . Colon cancer Neg Hx      Review of Systems  Constitutional: Negative.  Negative for chills and fever.  HENT: Negative.  Negative for congestion and sore throat.   Respiratory: Negative.  Negative for cough and shortness of breath.   Cardiovascular: Negative.  Negative for chest pain and palpitations.  Gastrointestinal:  Positive for diarrhea. Negative for abdominal pain, blood in stool, melena, nausea and vomiting.  Genitourinary: Negative.  Negative for dysuria and hematuria.  Skin: Negative.  Negative for rash.  Neurological: Negative.  Negative for dizziness and headaches.  All other systems reviewed and are negative.   Vitals:   02/18/24 1537  BP: 118/68  Pulse: 70  Temp: 98.7 F (37.1 C)  SpO2: 93%    Physical Exam Vitals reviewed.  Constitutional:      Appearance: Normal appearance.  HENT:     Head: Normocephalic.     Mouth/Throat:     Mouth: Mucous membranes are moist.     Pharynx: Oropharynx is clear.   Eyes:     Extraocular Movements: Extraocular movements  intact.     Pupils: Pupils are equal, round, and reactive to light.    Cardiovascular:     Rate and Rhythm: Normal rate and regular rhythm.     Pulses: Normal pulses.     Heart sounds: Normal heart sounds.  Pulmonary:     Effort: Pulmonary effort is normal.     Breath sounds: Normal breath sounds.  Abdominal:     Palpations: Abdomen is soft.     Tenderness: There is no abdominal tenderness.   Musculoskeletal:     Cervical back: No tenderness.  Lymphadenopathy:     Cervical: No cervical adenopathy.   Skin:    General: Skin is warm and dry.     Capillary Refill: Capillary refill takes less than 2 seconds.   Neurological:     General: No focal deficit present.     Mental Status: He is alert and oriented to person, place, and time.   Psychiatric:        Mood and Affect: Mood normal.        Behavior: Behavior normal.     ASSESSMENT & PLAN: A total of 42 minutes was spent with the patient and counseling/coordination of care regarding preparing for this visit, review of most recent office visit notes, review of multiple chronic medical conditions under management, review of all medications, review of most recent stool profile results, review of most recent colonoscopy report, management of diarrhea, education and nutrition, need for GI evaluation, prognosis, documentation and need for follow-up if no better or worse during the next several days or weeks.  Problem List Items Addressed This Visit       Digestive   Diarrhea due to malabsorption - Primary   Clinically stable.  No red flag signs or symptoms. Benign abdominal examination.  Afebrile with normal vital signs Medication list reviewed with patient.  No new medications. No recent changes in doses. Recent GI pathogen panel was negative. Last colonoscopy 2017 showed diverticulosis and a couple of small polyps Differential diagnosis discussed with patient Most likely secondary to recent introduction of probiotics Advised  to stop probiotics. Also recently started taking protein shakes.  Advised to stop them also. Recommend GI evaluation.  Referral placed today May need repeat colonoscopy. ED precautions given Advised to follow-up with PCP and contact the office if no better or worse during the next several days or weeks.      Relevant Orders   CBC with Differential/Platelet   Comprehensive metabolic panel with GFR   TSH   Vitamin B12   Lipase   Ambulatory referral to Gastroenterology   Patient Instructions  Diarrhea, Adult Diarrhea is when you pass loose and sometimes watery poop (stool) often. Diarrhea can make you feel weak and cause you to lose water in your body (get dehydrated). Losing water in your body can cause you to: Feel tired and thirsty. Have a dry mouth. Go pee (urinate) less often. Diarrhea often lasts 2-3 days. It can last longer if it is a sign of something more serious. Be sure to treat your diarrhea as told by your doctor. Follow these instructions at home: Eating and drinking     Follow these instructions as told by your doctor: Take an ORS (oral rehydration solution). This is a drink that helps you replace fluids and minerals your body lost. It is sold at pharmacies and stores. Drink enough fluid to keep your pee (urine) pale yellow. Drink fluids such as: Water. You can also get fluids by sucking on ice chips. Diluted fruit juice. Low-calorie sports drinks. Milk. Avoid drinking fluids that have a lot of sugar or caffeine in them. These include soda, energy drinks, and regular sports drinks. Avoid alcohol. Eat bland, easy-to-digest foods in small amounts as you are able. These foods include: Bananas. Applesauce. Rice. Low-fat (lean) meats. Toast. Crackers. Avoid spicy or fatty foods.  Medicines Take over-the-counter and prescription medicines only as told by your doctor. If you were prescribed antibiotics, take them as told by your doctor. Do not stop taking them  even if you start to feel better. General instructions  Wash your hands often using soap and water for 20 seconds. If soap and water are not available, use hand sanitizer. Others in your home should wash their hands as well. Wash your hands: After using the toilet or changing a diaper. Before preparing, cooking, or serving food. While caring for a sick person. While visiting someone in a hospital. Rest at home while you get better. Take a warm bath to help with any burning or pain from having diarrhea. Watch your condition for any changes. Contact a doctor if: You have a fever. Your diarrhea gets worse. You have new symptoms. You vomit every time you eat or drink. You feel light-headed, dizzy, or you have a headache. You have muscle cramps. You have signs of losing too much water in your body, such as: Dark pee, very little pee, or no pee. Cracked lips. Dry mouth. Sunken eyes. Sleepiness. Weakness. You have bloody or black poop or poop that looks like tar. You have very bad pain, cramping, or bloating in your belly (abdomen). Your skin feels cold and clammy. You feel confused. Get help right away if: You have chest pain. Your heart is beating very quickly. You have trouble breathing or you are breathing very quickly. You feel very weak or you faint. These symptoms may be an emergency. Get help right away. Call 911. Do not wait to see if the symptoms will go away. Do not drive yourself to the hospital. This information is not intended to replace advice given  to you by your health care provider. Make sure you discuss any questions you have with your health care provider. Document Revised: 02/04/2022 Document Reviewed: 02/04/2022 Elsevier Patient Education  2024 [employer]    Maryagnes Small, MD [facility]

## 2024-02-20 ENCOUNTER — Encounter (HOSPITAL_COMMUNITY): Payer: Self-pay | Admitting: Anesthesiology

## 2024-02-20 ENCOUNTER — Inpatient Hospital Stay (HOSPITAL_COMMUNITY)
Admission: EM | Admit: 2024-02-20 | Discharge: 2024-02-23 | DRG: 854 | Disposition: A | Discharge status: Home / Self Care | Attending: Internal Medicine | Admitting: Internal Medicine

## 2024-02-20 ENCOUNTER — Encounter (HOSPITAL_COMMUNITY)
Admission: EM | Disposition: A | Discharge status: Home / Self Care | Payer: Self-pay | Source: Home / Self Care | Attending: Internal Medicine

## 2024-02-20 ENCOUNTER — Emergency Department (HOSPITAL_COMMUNITY)

## 2024-02-20 ENCOUNTER — Encounter (HOSPITAL_COMMUNITY): Payer: Self-pay

## 2024-02-20 ENCOUNTER — Other Ambulatory Visit: Payer: Self-pay

## 2024-02-20 DIAGNOSIS — E876 Hypokalemia: Secondary | ICD-10-CM | POA: Diagnosis not present

## 2024-02-20 DIAGNOSIS — N23 Unspecified renal colic: Secondary | ICD-10-CM

## 2024-02-20 DIAGNOSIS — A09 Infectious gastroenteritis and colitis, unspecified: Secondary | ICD-10-CM | POA: Diagnosis present

## 2024-02-20 DIAGNOSIS — K5730 Diverticulosis of large intestine without perforation or abscess without bleeding: Secondary | ICD-10-CM | POA: Diagnosis not present

## 2024-02-20 DIAGNOSIS — N201 Calculus of ureter: Secondary | ICD-10-CM | POA: Diagnosis not present

## 2024-02-20 DIAGNOSIS — R1084 Generalized abdominal pain: Secondary | ICD-10-CM | POA: Diagnosis not present

## 2024-02-20 DIAGNOSIS — E782 Mixed hyperlipidemia: Secondary | ICD-10-CM | POA: Diagnosis present

## 2024-02-20 DIAGNOSIS — Z8249 Family history of ischemic heart disease and other diseases of the circulatory system: Secondary | ICD-10-CM

## 2024-02-20 DIAGNOSIS — Z87891 Personal history of nicotine dependence: Secondary | ICD-10-CM | POA: Diagnosis not present

## 2024-02-20 DIAGNOSIS — N401 Enlarged prostate with lower urinary tract symptoms: Secondary | ICD-10-CM | POA: Diagnosis not present

## 2024-02-20 DIAGNOSIS — N136 Pyonephrosis: Secondary | ICD-10-CM | POA: Diagnosis present

## 2024-02-20 DIAGNOSIS — I10 Essential (primary) hypertension: Secondary | ICD-10-CM | POA: Diagnosis not present

## 2024-02-20 DIAGNOSIS — M1990 Unspecified osteoarthritis, unspecified site: Secondary | ICD-10-CM | POA: Diagnosis present

## 2024-02-20 DIAGNOSIS — A419 Sepsis, unspecified organism: Principal | ICD-10-CM | POA: Diagnosis present

## 2024-02-20 DIAGNOSIS — N10 Acute tubulo-interstitial nephritis: Secondary | ICD-10-CM | POA: Diagnosis present

## 2024-02-20 DIAGNOSIS — Z79899 Other long term (current) drug therapy: Secondary | ICD-10-CM | POA: Diagnosis not present

## 2024-02-20 DIAGNOSIS — N12 Tubulo-interstitial nephritis, not specified as acute or chronic: Secondary | ICD-10-CM

## 2024-02-20 DIAGNOSIS — Z833 Family history of diabetes mellitus: Secondary | ICD-10-CM

## 2024-02-20 DIAGNOSIS — Z7982 Long term (current) use of aspirin: Secondary | ICD-10-CM | POA: Diagnosis not present

## 2024-02-20 DIAGNOSIS — N132 Hydronephrosis with renal and ureteral calculous obstruction: Secondary | ICD-10-CM | POA: Diagnosis not present

## 2024-02-20 DIAGNOSIS — R6510 Systemic inflammatory response syndrome (SIRS) of non-infectious origin without acute organ dysfunction: Secondary | ICD-10-CM | POA: Diagnosis not present

## 2024-02-20 DIAGNOSIS — R509 Fever, unspecified: Secondary | ICD-10-CM | POA: Diagnosis not present

## 2024-02-20 DIAGNOSIS — N134 Hydroureter: Secondary | ICD-10-CM | POA: Diagnosis not present

## 2024-02-20 DIAGNOSIS — R338 Other retention of urine: Secondary | ICD-10-CM | POA: Diagnosis not present

## 2024-02-20 DIAGNOSIS — M545 Low back pain, unspecified: Secondary | ICD-10-CM | POA: Diagnosis not present

## 2024-02-20 LAB — CBC WITH DIFFERENTIAL/PLATELET
Abs Immature Granulocytes: 0.12 10*3/uL — ABNORMAL HIGH (ref 0.00–0.07)
Basophils Absolute: 0.1 10*3/uL (ref 0.0–0.1)
Basophils Relative: 0 %
Eosinophils Absolute: 0.1 10*3/uL (ref 0.0–0.5)
Eosinophils Relative: 0 %
HCT: 43 % (ref 39.0–52.0)
Hemoglobin: 14.8 g/dL (ref 13.0–17.0)
Immature Granulocytes: 1 %
Lymphocytes Relative: 5 %
Lymphs Abs: 1 10*3/uL (ref 0.7–4.0)
MCH: 33.4 pg (ref 26.0–34.0)
MCHC: 34.4 g/dL (ref 30.0–36.0)
MCV: 97.1 fL (ref 80.0–100.0)
Monocytes Absolute: 1.2 10*3/uL — ABNORMAL HIGH (ref 0.1–1.0)
Monocytes Relative: 6 %
Neutro Abs: 15.9 10*3/uL — ABNORMAL HIGH (ref 1.7–7.7)
Neutrophils Relative %: 88 %
Platelets: 259 10*3/uL (ref 150–400)
RBC: 4.43 MIL/uL (ref 4.22–5.81)
RDW: 12.2 % (ref 11.5–15.5)
WBC: 18.3 10*3/uL — ABNORMAL HIGH (ref 4.0–10.5)
nRBC: 0 % (ref 0.0–0.2)

## 2024-02-20 LAB — COMPREHENSIVE METABOLIC PANEL WITH GFR
ALT: 39 U/L (ref 0–44)
AST: 43 U/L — ABNORMAL HIGH (ref 15–41)
Albumin: 4.2 g/dL (ref 3.5–5.0)
Alkaline Phosphatase: 57 U/L (ref 38–126)
Anion gap: 12 (ref 5–15)
BUN: 27 mg/dL — ABNORMAL HIGH (ref 8–23)
CO2: 22 mmol/L (ref 22–32)
Calcium: 9.3 mg/dL (ref 8.9–10.3)
Chloride: 102 mmol/L (ref 98–111)
Creatinine, Ser: 0.81 mg/dL (ref 0.61–1.24)
GFR, Estimated: >60 mL/min (ref >60)
Glucose, Bld: 111 mg/dL — ABNORMAL HIGH (ref 70–99)
Potassium: 4.1 mmol/L (ref 3.5–5.1)
Sodium: 136 mmol/L (ref 135–145)
Total Bilirubin: 1 mg/dL (ref 0.0–1.2)
Total Protein: 7.2 g/dL (ref 6.5–8.1)

## 2024-02-20 LAB — URINALYSIS, W/ REFLEX TO CULTURE (INFECTION SUSPECTED)
Bilirubin Urine: NEGATIVE
Glucose, UA: NEGATIVE mg/dL
Ketones, ur: NEGATIVE mg/dL
Nitrite: POSITIVE — AB
Protein, ur: NEGATIVE mg/dL
Specific Gravity, Urine: 1.012 (ref 1.005–1.030)
WBC, UA: >50 WBC/hpf (ref 0–5)
pH: 5 (ref 5.0–8.0)

## 2024-02-20 LAB — I-STAT CG4 LACTIC ACID, ED: Lactic Acid, Venous: 1.5 mmol/L (ref 0.5–1.9)

## 2024-02-20 LAB — LIPASE, BLOOD: Lipase: 40 U/L (ref 11–51)

## 2024-02-20 LAB — PROTIME-INR
INR: 1 (ref 0.8–1.2)
Prothrombin Time: 13.5 s (ref 11.4–15.2)

## 2024-02-20 SURGERY — CYSTOSCOPY, WITH RETROGRADE PYELOGRAM AND URETERAL STENT INSERTION
Anesthesia: General | Laterality: Left

## 2024-02-20 MED ORDER — LACTATED RINGERS IV BOLUS (SEPSIS)
1000.0000 mL | Freq: Once | INTRAVENOUS | Status: AC
  Administered 2024-02-20: 1000 mL via INTRAVENOUS

## 2024-02-20 MED ORDER — LIDOCAINE HCL (PF) 2 % IJ SOLN
INTRAMUSCULAR | Status: AC
  Filled 2024-02-20: qty 5

## 2024-02-20 MED ORDER — PROPOFOL 10 MG/ML IV BOLUS
INTRAVENOUS | Status: AC
  Filled 2024-02-20: qty 20

## 2024-02-20 MED ORDER — FENTANYL CITRATE (PF) 100 MCG/2ML IJ SOLN
INTRAMUSCULAR | Status: AC
  Filled 2024-02-20: qty 2

## 2024-02-20 MED ORDER — SODIUM CHLORIDE 0.9 % IV SOLN: Freq: Once | INTRAVENOUS | Status: AC

## 2024-02-20 MED ORDER — PHENYLEPHRINE 80 MCG/ML (10ML) SYRINGE FOR IV PUSH (FOR BLOOD PRESSURE SUPPORT)
PREFILLED_SYRINGE | INTRAVENOUS | Status: AC
Start: 2024-02-20 — End: 2024-02-20
  Filled 2024-02-20: qty 10

## 2024-02-20 MED ORDER — SODIUM CHLORIDE 0.9 % IV SOLN
2.0000 g | INTRAVENOUS | Status: DC
  Administered 2024-02-21 – 2024-02-22 (×2): 2 g via INTRAVENOUS
  Filled 2024-02-20 (×2): qty 20

## 2024-02-20 MED ORDER — ROCURONIUM BROMIDE 10 MG/ML (PF) SYRINGE
PREFILLED_SYRINGE | INTRAVENOUS | Status: AC
Start: 2024-02-20 — End: 2024-02-20
  Filled 2024-02-20: qty 10

## 2024-02-20 MED ORDER — LACTATED RINGERS IV SOLN
150.0000 mL/h | INTRAVENOUS | Status: DC
  Administered 2024-02-20: 150 mL/h via INTRAVENOUS

## 2024-02-20 MED ORDER — SUCCINYLCHOLINE CHLORIDE 200 MG/10ML IV SOSY
PREFILLED_SYRINGE | INTRAVENOUS | Status: AC
Start: 2024-02-20 — End: 2024-02-20
  Filled 2024-02-20: qty 10

## 2024-02-20 MED ORDER — SODIUM CHLORIDE 0.9 % IV SOLN
2.0000 g | Freq: Once | INTRAVENOUS | Status: AC
  Administered 2024-02-20: 2 g via INTRAVENOUS
  Filled 2024-02-20: qty 20

## 2024-02-20 NOTE — H&P (Signed)
 Andrew Foster FMW:[id number] DOB: June 20, 1941 DOA: 02/20/2024     PCP: Rollene Almarie LABOR, MD     Urology Dr. Nieves  Patient arrived to ER on 02/20/24 at 1817 Referred by Attending Towana Ozell BROCKS, MD   Patient coming from:    home Lives alone,      Chief Complaint:   Chief Complaint  Patient presents with   Flank Pain    HPI: Andrew Foster is a 83 y.o. male with medical history significant of BPH, kidney stones, chronic diarrhea, knee arthritis   Presented with  left flank pain Left side abd pain, temp 100.8 urinary frequency and chills  Last time had this was kidney infection  Sees [facility]  Has chronic diarrhea ofr 1 month  No headache, N?V not on AC      Denies significant ETOH intake   Does not smoke      Regarding pertinent Chronic problems:         BPH - on  Proscar      While in ER: Clinical Course as of 02/20/24 2159  Sat Feb 20, 2024  1847 Chest x-ray interpreted by me as no acute infiltrate.  Awaiting radiology reading. [MB]  2145 Urinalysis concerning for infection.  Elevated white count.  CT showing left ureterovesicular stone.  Have placed a call to urology on-call.  IV antibiotics infused. [MB]    Clinical Course User Index [MB] Towana Ozell BROCKS, MD     URology Dr. Wiley will take to OR tonight  Asked Hospitalist for admit    Lab Orders         Blood Culture (routine x 2)         Urine Culture         Comprehensive metabolic panel         CBC with Differential         Protime-INR         Lipase, blood         Urinalysis, w/ Reflex to Culture (Infection Suspected) -Urine, Clean Catch      CXR - Pulmonary hypoinflation.   CTabd/pelvis - Obstructing 2 x 3 x 5 mm calculus at the left ureterovesicular junction resulting in mild left hydronephrosis and hydroureter. 2. Severe sigmoid diverticulosis. 3. Aortic atherosclerosis.    Following Medications were ordered in ER: Medications  0.9 %  sodium chloride  infusion (has no  administration in time range)  lactated ringers bolus 1,000 mL (0 mLs Intravenous Stopped 02/20/24 2056)  cefTRIAXone  (ROCEPHIN ) 2 g in sodium chloride  0.9 % 100 mL IVPB (2 g Intravenous New Bag/Given 02/20/24 2116)    _______________________________________________________ ER Provider Called:     Urology   Dr.[facility] They Recommend admit to medicine   Will see  in ER     ED Triage Vitals  Encounter Vitals Group     BP 02/20/24 1830 107/67     Girls Systolic BP Percentile --      Girls Diastolic BP Percentile --      Boys Systolic BP Percentile --      Boys Diastolic BP Percentile --      Pulse Rate 02/20/24 1830 85     Resp 02/20/24 1830 17     Temp 02/20/24 1834 (!) 100.8 F (38.2 C)     Temp Source 02/20/24 1834 Oral     SpO2 02/20/24 1830 93 %     Weight --      Height --  Head Circumference --      Peak Flow --      Pain Score 02/20/24 1835 6     Pain Loc --      Pain Education --      Exclude from Growth Chart --   UFJK(75)@     _________________________________________ Significant initial  Findings: Abnormal Labs Reviewed  COMPREHENSIVE METABOLIC PANEL WITH GFR - Abnormal; Notable for the following components:      Result Value   Glucose, Bld 111 (*)    BUN 27 (*)    AST 43 (*)    All other components within normal limits  CBC WITH DIFFERENTIAL/PLATELET - Abnormal; Notable for the following components:   WBC 18.3 (*)    Neutro Abs 15.9 (*)    Monocytes Absolute 1.2 (*)    Abs Immature Granulocytes 0.12 (*)    All other components within normal limits  URINALYSIS, W/ REFLEX TO CULTURE (INFECTION SUSPECTED) - Abnormal; Notable for the following components:   APPearance HAZY (*)    Hgb urine dipstick MODERATE (*)    Nitrite POSITIVE (*)    Leukocytes,Ua LARGE (*)    Bacteria, UA MANY (*)    All other components within normal limits        ECG: Ordered Personally reviewed and interpreted by me showing: HR : 80 Rhythm:Sinus rhythm Probable right  ventricular hypertrophy No significant change since prior 2/24 QTC 440  BNP (last 3 results) No results for input(s): BNP in the last 8760 hours.    No results for input(s): DDIMER, FERRITIN, LDH, CRP in the last 72 hours.    ____________________ This patient meets SIRS Criteria and may be septic. SIRS = Systemic Inflammatory Response Syndrome  Order a lactic acid level if needed AND/OR Initiate the sepsis protocol with the attached order set OR Click Treating Associated Infection or Illness if the patient is being treated for an infection that is a known cause of these abnormalities     The recent clinical data is shown below. Vitals:   02/20/24 1834 02/20/24 1900 02/20/24 2000 02/20/24 2100  BP:  116/71 104/69 110/61  Pulse:  84 76 74  Resp: 17 18 14 14   Temp: (!) 100.8 F (38.2 C)     TempSrc: Oral     SpO2:  (!) 89% 94% 93%    WBC     Component Value Date/Time   WBC 18.3 (H) 02/20/2024 1914   LYMPHSABS 1.0 02/20/2024 1914   MONOABS 1.2 (H) 02/20/2024 1914   EOSABS 0.1 02/20/2024 1914   BASOSABS 0.1 02/20/2024 1914    Lactic Acid, Venous    Component Value Date/Time   LATICACIDVEN 1.5 02/20/2024 1926     UA  evidence of UTI   Urine analysis:    Component Value Date/Time   COLORURINE YELLOW 02/20/2024 2010   APPEARANCEUR HAZY (A) 02/20/2024 2010   LABSPEC 1.012 02/20/2024 2010   PHURINE 5.0 02/20/2024 2010   GLUCOSEU NEGATIVE 02/20/2024 2010   GLUCOSEU NEGATIVE 10/18/2014 0902   HGBUR MODERATE (A) 02/20/2024 2010   HGBUR negative 09/13/2008 1135   BILIRUBINUR NEGATIVE 02/20/2024 2010   KETONESUR NEGATIVE 02/20/2024 2010   PROTEINUR NEGATIVE 02/20/2024 2010   UROBILINOGEN 0.2 10/18/2014 0902   NITRITE POSITIVE (A) 02/20/2024 2010   LEUKOCYTESUR LARGE (A) 02/20/2024 2010      ABX started Antibiotics Given (last 72 hours)     Date/Time Action Medication Dose Rate   02/20/24 2116 New Bag/Given   cefTRIAXone  (ROCEPHIN ) 2  g in sodium  chloride 0.9 % 100 mL IVPB 2 g 200 mL/hr       __________________________________________________________ Recent Labs  Lab 02/20/24 1914  NA 136  K 4.1  CO2 22  GLUCOSE 111*  BUN 27*  CREATININE 0.81  CALCIUM 9.3    Cr   stable,   Lab Results  Component Value Date   CREATININE 0.81 02/20/2024   CREATININE 0.91 10/16/2022   CREATININE 1.07 10/16/2022    Recent Labs  Lab 02/20/24 1914  AST 43*  ALT 39  ALKPHOS 57  BILITOT 1.0  PROT 7.2  ALBUMIN 4.2   Lab Results  Component Value Date   CALCIUM 9.3 02/20/2024   PHOS 3.5 10/16/2022    Plt: Lab Results  Component Value Date   PLT 259 02/20/2024       Recent Labs  Lab 02/20/24 1914  WBC 18.3*  NEUTROABS 15.9*  HGB 14.8  HCT 43.0  MCV 97.1  PLT 259    HG/HCT  stable,      Component Value Date/Time   HGB 14.8 02/20/2024 1914   HCT 43.0 02/20/2024 1914   MCV 97.1 02/20/2024 1914     Recent Labs  Lab 02/20/24 1914  LIPASE 40   No results for input(s): AMMONIA in the last 168 hours.    _______________________________________________ Hospitalist was called for admission for UTI, and left ureteral stone   The following Work up has been ordered so far:  Orders Placed This Encounter  Procedures   Blood Culture (routine x 2)   Urine Culture   DG Chest Port 1 View   CT Renal Stone Study   Comprehensive metabolic panel   CBC with Differential   Protime-INR   Lipase, blood   Urinalysis, w/ Reflex to Culture (Infection Suspected) -Urine, Clean Catch   Diet NPO time specified   Document height and weight   Assess and Document Glasgow Coma Scale   Document vital signs within 1-hour of fluid bolus completion.  Notify provider of abnormal vital signs despite fluid resuscitation.   Refer to Sidebar Report: Sepsis Bundle ED/IP   Notify provider for difficulties obtaining IV access   Initiate Carrier Fluid Protocol   Consult to urology   Consult to hospitalist   ED EKG   EKG 12-Lead   Insert  peripheral IV X 1     OTHER Significant initial  Findings:  labs showing:     DM  labs:  HbA1C: No results for input(s): HGBA1C in the last 8760 hours.     CBG (last 3)  No results for input(s): GLUCAP in the last 72 hours.        Cultures:    Component Value Date/Time   SDES  10/16/2022 0011    URINE, CLEAN CATCH Performed at [facility], [address] [place], [place] [address]    SPECREQUEST  10/16/2022 0011    NONE Performed at [facility], [address], [place], [place] [address]    CULT >=100,000 COLONIES/mL PROTEUS MIRABILIS (A) 10/16/2022 0011   REPTSTATUS 10/18/2022 FINAL 10/16/2022 0011     Radiological Exams on Admission: CT Renal Stone Study Result Date: 02/20/2024 CLINICAL DATA:  Abdominal and flank pain EXAM: CT ABDOMEN AND PELVIS WITHOUT CONTRAST TECHNIQUE: Multidetector CT imaging of the abdomen and pelvis was performed following the standard protocol without IV contrast. RADIATION DOSE REDUCTION: This exam was performed according to the departmental dose-optimization program which includes automated exposure control, adjustment of the  mA and/or kV according to patient size and/or use of iterative reconstruction technique. COMPARISON:  10/15/2022 FINDINGS: Lower chest: No acute abnormality. Extensive left anterior descending coronary artery calcification Hepatobiliary: No focal liver abnormality is seen. No gallstones, gallbladder wall thickening, or biliary dilatation. Pancreas: Unremarkable Spleen: Unremarkable Adrenals/Urinary Tract: The adrenal glands are unremarkable. The kidneys are normal in size and position. Stable simple cortical cyst within the upper pole of the right kidney. There is mild left hydronephrosis and hydroureter to the level of the left ureterovesicular junction where an obstructing 2 x 3 x 5 mm calculus is identified. No additional renal or ureteral calculi. No hydronephrosis on the right. The  bladder is. Stomach/Bowel: Severe sigmoid diverticulosis. Stomach, small bowel, and large bowel are otherwise unremarkable. Appendix normal. No evidence of obstruction or focal inflammation. No free intraperitoneal gas or fluid. Vascular/Lymphatic: Aortic atherosclerosis. No enlarged abdominal or pelvic lymph nodes. Reproductive: Moderate prostatic hypertrophy Other: No abdominal wall hernia or abnormality. No abdominopelvic ascites. Musculoskeletal: No acute bone abnormality. No lytic or blastic bone lesion. Osseous structures are age appropriate. Advanced degenerative changes within the lumbar spine result in moderate to severe bilateral neuroforaminal narrowing at L1-2 and L2-3, most severe on the right at L1-2. IMPRESSION: 1. Obstructing 2 x 3 x 5 mm calculus at the left ureterovesicular junction resulting in mild left hydronephrosis and hydroureter. 2. Severe sigmoid diverticulosis. 3. Aortic atherosclerosis. Aortic Atherosclerosis (ICD10-I70.0). Electronically Signed   By: Dorethia Molt M.D.   On: 02/20/2024 21:36   DG Chest Port 1 View Result Date: 02/20/2024 CLINICAL DATA:  Sepsis EXAM: PORTABLE CHEST 1 VIEW COMPARISON:  None Available. FINDINGS: Lungs volumes are small, but are symmetric and are clear. No pneumothorax or pleural effusion. Cardiac size within normal limits. Pulmonary vascularity is normal. Osseous structures are age-appropriate. No acute bone abnormality. IMPRESSION: 1. Pulmonary hypoinflation. Electronically Signed   By: Dorethia Molt M.D.   On: 02/20/2024 19:24   _______________________________________________________________________________________________________ Latest  Blood pressure 110/61, pulse 74, temperature (!) 100.8 F (38.2 C), temperature source Oral, resp. rate 14, SpO2 93%.   Vitals  labs and radiology finding personally reviewed  Review of Systems:    Pertinent positives include:    Fevers, chills, fatigue flank pain. Constitutional:  No weight loss, night  sweats,, weight loss  HEENT:  No headaches, Difficulty swallowing,Tooth/dental problems,Sore throat,  No sneezing, itching, ear ache, nasal congestion, post nasal drip,  Cardio-vascular:  No chest pain, Orthopnea, PND, anasarca, dizziness, palpitations.no Bilateral lower extremity swelling  GI:  No heartburn, indigestion, abdominal pain, nausea, vomiting, diarrhea, change in bowel habits, loss of appetite, melena, blood in stool, hematemesis Resp:  no shortness of breath at rest. No dyspnea on exertion, No excess mucus, no productive cough, No non-productive cough, No coughing up of blood.No change in color of mucus.No wheezing. Skin:  no rash or lesions. No jaundice GU:  no dysuria, change in color of urine, no urgency or frequency. No straining to urinate.  No  Musculoskeletal:  No joint pain or no joint swelling. No decreased range of motion. No back pain.  Psych:  No change in mood or affect. No depression or anxiety. No memory loss.  Neuro: no localizing neurological complaints, no tingling, no weakness, no double vision, no gait abnormality, no slurred speech, no confusion  All systems reviewed and apart from HOPI all are negative _______________________________________________________________________________________________ Past Medical History:   Past Medical History:  Diagnosis Date   Acute pyelonephritis 11/01/2016   Adenomatous polyps of colon 01/2017  no f/u - age   BPH (benign prostatic hyperplasia)    Diverticulosis    Hypertension       Past Surgical History:  Procedure Laterality Date   CARPAL TUNNEL RELEASE     COLONOSCOPY     Tics; [facility]   HERNIA REPAIR     intra articular steroids     R shoulder   PROSTATE BIOPSY     Dr Aleene   VASECTOMY      Social History:  Ambulatory   cane,     reports that he quit smoking about 49 years ago. His smoking use included cigarettes. He has never used smokeless tobacco. He reports that he does not drink  alcohol and does not use drugs.   Family History:   Family History  Problem Relation Age of Onset   Heart attack Mother 34   Diabetes Mother    Diabetes Sister        TWO sisters   Diabetes Brother        TWO brothers   Diabetes type I Brother    Stroke Neg Hx    Cancer Neg Hx    Colon cancer Neg Hx    ______________________________________________________________________________________________ Allergies: No Known Allergies   Prior to Admission medications   Medication Sig Start Date End Date Taking? Authorizing Provider  aspirin  81 MG tablet Take 81 mg by mouth daily.    [provider]  cyclobenzaprine  (FLEXERIL ) 5 MG tablet Take 1 tablet (5 mg total) by mouth 2 (two) times daily as needed for muscle spasms. 02/04/23   Rollene Almarie LABOR, MD  diclofenac  (VOLTAREN ) 75 MG EC tablet Take 1 tablet by mouth twice daily 01/19/24   Rollene Almarie LABOR, MD  diphenoxylate -atropine  (LOMOTIL ) 2.5-0.025 MG tablet Take 1 tablet by mouth 4 (four) times daily as needed for diarrhea or loose stools. Patient not taking: Reported on 02/18/2024 02/04/24   Norleen Lynwood ORN, MD  finasteride (PROSCAR) 5 MG tablet Take 5 mg by mouth daily.    [provider]  glucosamine-chondroitin 500-400 MG tablet Take 1 tablet by mouth every other day.    [provider]  meclizine  (ANTIVERT ) 12.5 MG tablet Take 1 tablet (12.5 mg total) by mouth 3 (three) times daily as needed for dizziness. 10/16/20   Rollene Almarie LABOR, MD  Multiple Vitamin (MULTIVITAMIN) tablet Take 1 tablet by mouth daily.    [provider]  omeprazole  (PRILOSEC  OTC) 20 MG tablet Take 20 mg by mouth daily.    [provider]  pravastatin  (PRAVACHOL ) 20 MG tablet Take 1 tablet by mouth once daily 12/31/23   Rollene Almarie LABOR, MD  tamsulosin  (FLOMAX ) 0.4 MG CAPS capsule Take 1 capsule by mouth once daily at bedtime 12/31/23   Rollene Almarie LABOR, MD  traMADol  (ULTRAM ) 50 MG tablet Take 1 tablet by  mouth once daily 10/29/23   Rollene Almarie LABOR, MD  Vit B6-Vit B12-Omega 3 Acids (VITAMIN B PLUS+ PO) Take 1 tablet by mouth daily.    [provider]  Vitamin D, Cholecalciferol, 1000 units CAPS Take 1 capsule by mouth daily.    [provider]    ___________________________________________________________________________________________________ Physical Exam:    02/20/2024    9:00 PM 02/20/2024    8:00 PM 02/20/2024    7:00 PM  Vitals with BMI  Systolic 110 104 883  Diastolic 61 69 71  Pulse 74 76 84     1. General:  in No  Acute distress   Chronically  ill-appearing 2. Psychological: Alert and   Oriented 3. Head/ENT:    Dry Mucous Membranes                          Head Non traumatic, neck supple                         Poor Dentition 4. SKIN:  decreased Skin turgor,  Skin clean Dry and intact no rash    5. Heart: Regular rate and rhythm no  Murmur, no Rub or gallop 6. Lungs:  no wheezes or crackles   7. Abdomen: Soft,  non-tender, Non distended bowel sounds present 8. Lower extremities: no clubbing, cyanosis, no  edema 9. Neurologically Grossly intact, moving all 4 extremities equally   10. MSK: Normal range of motion    Chart has been reviewed  ______________________________________________________________________________________________  Assessment/Plan 83 y.o. male with medical history significant of BPH, kidney stones, chronic diarrhea, knee arthritis   Admitted for left ureteral stone with UTI    Present on Admission: **None**     No problem-specific Assessment & Plan notes found for this encounter.    Other plan as per orders.  DVT prophylaxis:  SCD       Code Status:    Code Status: Prior FULL CODE *** DNR/DNI ***comfort care as per patient ***family  I had personally discussed CODE STATUS with patient and family*  ACP *** none has been reviewed ***   Family Communication:   Family not at  Bedside    Diet  Diet Orders (From  admission, onward)     Start     Ordered   02/20/24 2158  Diet NPO time specified  Diet effective now        02/20/24 2158            Disposition Plan:      To home once workup is complete and patient is stable   Following barriers for discharge:                                                        Pain controlled   transition to PO antibiotics                                                       Will need consultants to evaluate patient prior to discharge                            Consult Orders  (From admission, onward)           Start     Ordered   02/20/24 2156  Consult to hospitalist  Once       Provider:  (Not yet assigned)  Question Answer Comment  Place call to: [facility]   Reason for Consult Admit      02/20/24 2155                   Consults called: Urology   Admission status:  ED Disposition     ED Disposition  Admit   Condition  --   Comment  The patient appears reasonably stabilized for admission considering the current resources, flow, and capabilities available in the ED at this time, and I doubt any other [facility] requiring further screening and/or treatment in the ED prior to admission is  present.            inpatient     I Expect 2 midnight stay secondary to severity of patient's current illness need for inpatient interventions justified by the following:   *** Severe lab/radiological/exam abnormalities including:    There are no diagnoses linked to this encounter. and extensive comorbidities including: *substance abuse  *Chronic pain *DM2  * CHF * CAD  * COPD/asthma *Morbid Obesity * CKD *dementia *liver disease *history of stroke with residual deficits *  malignancy, * sickle cell disease  History of amputation Chronic anticoagulation  That are currently affecting medical management.   I expect  patient to be hospitalized for 2 midnights requiring inpatient medical care.  Patient is at high risk for  adverse outcome (such as loss of life or disability) if not treated.  Indication for inpatient stay as follows:  Severe change from baseline regarding mental status Hemodynamic instability despite maximal medical therapy,  severe pain requiring acute inpatient management,  inability to maintain oral hydration   persistent chest pain despite medical management Need for operative/procedural  intervention New or worsening hypoxia ongoing suicidal ideations   Need for IV antibiotics, IV fluids,, IV pain medications, IV anticoagulation,  IV rate controling medications, IV antihypertensives need for biPAP Frequent labs    Level of care   *** tele  For 12H 24H     medical floor       progressive     stepdown   tele indefinitely please discontinue once patient no longer qualifies COVID-19 Labs    Critical***  Patient is critically ill due to  hemodynamic instability * respiratory failure *severe sepsis* ongoing chest pain*  They are at high risk for life/limb threatening clinical deterioration requiring frequent reassessment and modifications of care.  Services provided include examination of the patient, review of relevant ancillary tests, prescription of lifesaving therapies, review of medications and prophylactic therapy.  Total critical care time excluding separately billable procedures: 60*  Minutes.    Keenya Matera 02/20/2024, 9:59 PM ***  [facility]     after 2 AM please page floor coverage   If 7AM-7PM, please contact the day team taking care of the patient using [email or web address]

## 2024-02-20 NOTE — Consult Note (Addendum)
 Consultation: Left ureteral stone, sepsis Requested by: Dr. Ozell Arts  History of Present Illness: Cote is an 83 year old male that I follow for BPH and lower urinary tract symptoms.  He has been on finasteride.  He developed left flank pain today and called EMS.  He was brought by EMS to hospital where he was found to have a temperature of 100.8, blood pressure of 104/69, white count was 18, creatinine 0.81, lactic acid 1.5, UA with many bacteria.  CT scan of the abdomen and pelvis was obtained which showed a 2 to 3 mm left UVJ stone that looked to be closer to the bladder than the distal ureter.  Close to passing.  He was given fluids and antibiotics.  He is feeling much better.  Pain has resolved.  He did void into a urinal but did not see a stone.  Vital signs have improved with a temp of 98, pulse 69, ECG heart rate 71, blood pressure 112/72 satting 94% on room air.  Past Medical History:  Diagnosis Date   Acute pyelonephritis 11/01/2016   Adenomatous polyps of colon 01/2017   no f/u - age   BPH (benign prostatic hyperplasia)    Diverticulosis    Hypertension    Past Surgical History:  Procedure Laterality Date   CARPAL TUNNEL RELEASE     COLONOSCOPY     Tics; [facility]   HERNIA REPAIR     intra articular steroids     R shoulder   PROSTATE BIOPSY     Dr Aleene   VASECTOMY      Home Medications:  (Not in a hospital admission)  Allergies: No Known Allergies  Family History  Problem Relation Age of Onset   Heart attack Mother 50   Diabetes Mother    Diabetes Sister        TWO sisters   Diabetes Brother        TWO brothers   Diabetes type I Brother    Stroke Neg Hx    Cancer Neg Hx    Colon cancer Neg Hx    Social History:  reports that he quit smoking about 49 years ago. His smoking use included cigarettes. He has never used smokeless tobacco. He reports that he does not drink alcohol and does not use drugs.  ROS: A complete review of systems was performed.  All  systems are negative except for pertinent findings as noted. Review of Systems  All other systems reviewed and are negative.    Physical Exam:  Vital signs in last 24 hours: Temp:  [98.4 F (36.9 C)-100.8 F (38.2 C)] 98.4 F (36.9 C) (06/21 2304) Pulse Rate:  [69-85] 69 (06/21 2304) Resp:  [10-18] 17 (06/21 2304) BP: (104-116)/(61-72) 112/72 (06/21 2304) SpO2:  [89 %-94 %] 94 % (06/21 2304) General:  Alert and oriented, No acute distress, he looks well.  Resting comfortably. HEENT: Normocephalic, atraumatic Cardiovascular: Regular rate and rhythm Lungs: Regular rate and effort, nonlabored Abdomen: Soft, nontender, nondistended, no abdominal masses Back: No CVA tenderness Extremities: No edema Neurologic: Grossly intact  Laboratory Data:  Results for orders placed or performed during the hospital encounter of 02/20/24 (from the past 24 hours)  Comprehensive metabolic panel     Status: Abnormal   Collection Time: 02/20/24  7:14 PM  Result Value Ref Range   Sodium 136 135 - 145 mmol/L   Potassium 4.1 3.5 - 5.1 mmol/L   Chloride 102 98 - 111 mmol/L   CO2 22 22 -  32 mmol/L   Glucose, Bld 111 (H) 70 - 99 mg/dL   BUN 27 (H) 8 - 23 mg/dL   Creatinine, Ser 9.18 0.61 - 1.24 mg/dL   Calcium 9.3 8.9 - 89.6 mg/dL   Total Protein 7.2 6.5 - 8.1 g/dL   Albumin 4.2 3.5 - 5.0 g/dL   AST 43 (H) 15 - 41 U/L   ALT 39 0 - 44 U/L   Alkaline Phosphatase 57 38 - 126 U/L   Total Bilirubin 1.0 0.0 - 1.2 mg/dL   GFR, Estimated >39 >39 mL/min   Anion gap 12 5 - 15  CBC with Differential     Status: Abnormal   Collection Time: 02/20/24  7:14 PM  Result Value Ref Range   WBC 18.3 (H) 4.0 - 10.5 K/uL   RBC 4.43 4.22 - 5.81 MIL/uL   Hemoglobin 14.8 13.0 - 17.0 g/dL   HCT 56.9 60.9 - 47.9 %   MCV 97.1 80.0 - 100.0 fL   MCH 33.4 26.0 - 34.0 pg   MCHC 34.4 30.0 - 36.0 g/dL   RDW 87.7 88.4 - 84.4 %   Platelets 259 150 - 400 K/uL   nRBC 0.0 0.0 - 0.2 %   Neutrophils Relative % 88 %   Neutro  Abs 15.9 (H) 1.7 - 7.7 K/uL   Lymphocytes Relative 5 %   Lymphs Abs 1.0 0.7 - 4.0 K/uL   Monocytes Relative 6 %   Monocytes Absolute 1.2 (H) 0.1 - 1.0 K/uL   Eosinophils Relative 0 %   Eosinophils Absolute 0.1 0.0 - 0.5 K/uL   Basophils Relative 0 %   Basophils Absolute 0.1 0.0 - 0.1 K/uL   Immature Granulocytes 1 %   Abs Immature Granulocytes 0.12 (H) 0.00 - 0.07 K/uL  Protime-INR     Status: None   Collection Time: 02/20/24  7:14 PM  Result Value Ref Range   Prothrombin Time 13.5 11.4 - 15.2 seconds   INR 1.0 0.8 - 1.2  Lipase, blood     Status: None   Collection Time: 02/20/24  7:14 PM  Result Value Ref Range   Lipase 40 11 - 51 U/L  I-Stat Lactic Acid, ED     Status: None   Collection Time: 02/20/24  7:26 PM  Result Value Ref Range   Lactic Acid, Venous 1.5 0.5 - 1.9 mmol/L  Urinalysis, w/ Reflex to Culture (Infection Suspected) -Urine, Clean Catch     Status: Abnormal   Collection Time: 02/20/24  8:10 PM  Result Value Ref Range   Specimen Source URINE, CLEAN CATCH    Color, Urine YELLOW YELLOW   APPearance HAZY (A) CLEAR   Specific Gravity, Urine 1.012 1.005 - 1.030   pH 5.0 5.0 - 8.0   Glucose, UA NEGATIVE NEGATIVE mg/dL   Hgb urine dipstick MODERATE (A) NEGATIVE   Bilirubin Urine NEGATIVE NEGATIVE   Ketones, ur NEGATIVE NEGATIVE mg/dL   Protein, ur NEGATIVE NEGATIVE mg/dL   Nitrite POSITIVE (A) NEGATIVE   Leukocytes,Ua LARGE (A) NEGATIVE   RBC / HPF 6-10 0 - 5 RBC/hpf   WBC, UA >50 0 - 5 WBC/hpf   Bacteria, UA MANY (A) NONE SEEN   Squamous Epithelial / HPF 0-5 0 - 5 /HPF   Mucus PRESENT    Budding Yeast PRESENT    No results found for this or any previous visit (from the past 240 hours). Creatinine: Recent Labs    02/20/24 1914  CREATININE 0.81    Impression/Assessment:  Left ureteral stone, left hydronephrosis -he did have slight elevation of his temperature and a white count, but his vital signs have rapidly improved with resuscitation and his pain has  resolved.  We discussed the nature risk benefits and alternatives to proceeding with urgent cystoscopy left ureteral stent, possible ureteroscopy with stone basket extraction or laser lithotripsy.  Given his improvement in pain and clinical picture we will continue to closely monitor.  Plan:  --NPO p MN --Strain urine -Will consider cystoscopy, stent and ureteroscopy in the morning  Donnice Brooks 02/20/2024, 11:20 PM

## 2024-02-20 NOTE — ED Provider Notes (Signed)
 [place] EMERGENCY DEPARTMENT AT [facility] Provider Note   CSN: [id number] Arrival date & time: 02/20/24  8182     Patient presents with: Flank Pain   Andrew Foster is a 83 y.o. male.  He is brought in by EMS from home.  He has had left-sided abdominal and flank pain since this morning.  Possibly some increased urinary frequency although no dysuria.  Found to be febrile with a temp of 100.8.  He said he has been having some shaking chills today.  He tells me the last time he had that he had a kidney infection.  Has chronic prostate issues.  Has had loose stools but not diarrhea for about a month.  Saw his primary care doctor for that and it seems to be getting little better.  No headache cough shortness of breath nausea vomiting.  No dysuria or hematuria.  {Add pertinent medical, surgical, social history, OB history to YEP:67052} The history is provided by the patient.  Flank Pain This is a recurrent problem. The current episode started 6 to 12 hours ago. The problem occurs constantly. The problem has been gradually improving. Associated symptoms include abdominal pain. Pertinent negatives include no chest pain, no headaches and no shortness of breath. Nothing aggravates the symptoms. Nothing relieves the symptoms. He has tried acetaminophen  for the symptoms. The treatment provided moderate relief.       Prior to Admission medications   Medication Sig Start Date End Date Taking? Authorizing Provider  aspirin  81 MG tablet Take 81 mg by mouth daily.    [provider]  cyclobenzaprine  (FLEXERIL ) 5 MG tablet Take 1 tablet (5 mg total) by mouth 2 (two) times daily as needed for muscle spasms. 02/04/23   Rollene Almarie LABOR, MD  diclofenac  (VOLTAREN ) 75 MG EC tablet Take 1 tablet by mouth twice daily 01/19/24   Rollene Almarie LABOR, MD  diphenoxylate -atropine  (LOMOTIL ) 2.5-0.025 MG tablet Take 1 tablet by mouth 4 (four) times daily as needed for diarrhea or loose  stools. Patient not taking: Reported on 02/18/2024 02/04/24   Norleen Lynwood ORN, MD  finasteride (PROSCAR) 5 MG tablet Take 5 mg by mouth daily.    [provider]  glucosamine-chondroitin 500-400 MG tablet Take 1 tablet by mouth every other day.    [provider]  meclizine  (ANTIVERT ) 12.5 MG tablet Take 1 tablet (12.5 mg total) by mouth 3 (three) times daily as needed for dizziness. 10/16/20   Rollene Almarie LABOR, MD  Multiple Vitamin (MULTIVITAMIN) tablet Take 1 tablet by mouth daily.    [provider]  omeprazole  (PRILOSEC  OTC) 20 MG tablet Take 20 mg by mouth daily.    [provider]  pravastatin  (PRAVACHOL ) 20 MG tablet Take 1 tablet by mouth once daily 12/31/23   Rollene Almarie LABOR, MD  tamsulosin  (FLOMAX ) 0.4 MG CAPS capsule Take 1 capsule by mouth once daily at bedtime 12/31/23   Rollene Almarie LABOR, MD  traMADol  (ULTRAM ) 50 MG tablet Take 1 tablet by mouth once daily 10/29/23   Rollene Almarie LABOR, MD  Vit B6-Vit B12-Omega 3 Acids (VITAMIN B PLUS+ PO) Take 1 tablet by mouth daily.    [provider]  Vitamin D, Cholecalciferol, 1000 units CAPS Take 1 capsule by mouth daily.    [provider]    Allergies: Patient has no known allergies.    Review of Systems  Constitutional:  Positive for fever.  Eyes:  Negative for visual disturbance.  Respiratory:  Negative for  shortness of breath.   Cardiovascular:  Negative for chest pain.  Gastrointestinal:  Positive for abdominal pain.  Genitourinary:  Positive for flank pain.  Musculoskeletal:  Positive for back pain.  Neurological:  Negative for headaches.    Updated Vital Signs BP 107/67   Pulse 85   Temp (!) 100.8 F (38.2 C) (Oral)   Resp 17   SpO2 93%   Physical Exam Vitals and nursing note reviewed.  Constitutional:      General: He is not in acute distress.    Appearance: Normal appearance. He is well-developed.  HENT:     Head: Normocephalic and atraumatic.    Eyes:     Conjunctiva/sclera: Conjunctivae normal.    Cardiovascular:     Rate and Rhythm: Normal rate and regular rhythm.     Heart sounds: No murmur heard. Pulmonary:     Effort: Pulmonary effort is normal. No respiratory distress.     Breath sounds: Normal breath sounds.  Abdominal:     Palpations: Abdomen is soft.     Tenderness: There is no abdominal tenderness. There is no guarding or rebound.   Musculoskeletal:        General: No deformity.     Cervical back: Neck supple.   Skin:    General: Skin is warm and dry.     Capillary Refill: Capillary refill takes less than 2 seconds.   Neurological:     General: No focal deficit present.     Mental Status: He is alert and oriented to person, place, and time.     Motor: No weakness.   Psychiatric:        Mood and Affect: Mood normal.     (all labs ordered are listed, but only abnormal results are displayed) Labs Reviewed  CULTURE, BLOOD (ROUTINE X 2)  CULTURE, BLOOD (ROUTINE X 2)  COMPREHENSIVE METABOLIC PANEL WITH GFR  CBC WITH DIFFERENTIAL/PLATELET  PROTIME-INR  LIPASE, BLOOD  URINALYSIS, W/ REFLEX TO CULTURE (INFECTION SUSPECTED)  I-STAT CG4 LACTIC ACID, ED    EKG: None  Radiology: No results found.  {Document cardiac monitor, telemetry assessment procedure when appropriate:32947} Procedures   Medications Ordered in the ED  lactated ringers bolus 1,000 mL (has no administration in time range)      {Click here for ABCD2, HEART and other calculators REFRESH Note before signing:1}                              Medical Decision Making Amount and/or Complexity of Data Reviewed Labs: ordered. Radiology: ordered.   This patient complains of ***; this involves an extensive number of treatment Options and is a complaint that carries with it a high risk of complications and morbidity. The differential includes ***  I ordered, reviewed and interpreted labs, which included *** I ordered medication ***  and reviewed PMP when indicated. I ordered imaging studies which included *** and I independently    visualized and interpreted imaging which showed *** Additional history obtained from *** Previous records obtained and reviewed *** I consulted *** and discussed lab and imaging findings and discussed disposition.  Cardiac monitoring reviewed, *** Social determinants considered, *** Critical Interventions: ***  After the interventions stated above, I reevaluated the patient and found *** Admission and further testing considered, ***   {Document critical care time when appropriate  Document review of labs and clinical decision tools ie CHADS2VASC2, etc  Document your independent review of radiology  images and any outside records  Document your discussion with family members, caretakers and with consultants  Document social determinants of health affecting pt's care  Document your decision making why or why not admission, treatments were needed:32947:::1}   Final diagnoses:  None    ED Discharge Orders     None

## 2024-02-20 NOTE — Subjective & Objective (Signed)
 Left side abd pain, temp 100.8 urinary frequency and chills  Last time had this was kidney infection  Sees [facility]  Has chronic diarrhea ofr 1 month  No headache, N?V not on [facility]

## 2024-02-20 NOTE — ED Triage Notes (Addendum)
 Pt arrives via [facility]. PT c/o left lower abdominal/flank pain and increased urination that started this morning. Hx of kidney stones. EMS reports they administered 1000mg  of tylenol  pta for a temp of 100.8. Pt is AxOx4. Denies n/v.

## 2024-02-21 ENCOUNTER — Inpatient Hospital Stay (HOSPITAL_COMMUNITY)

## 2024-02-21 ENCOUNTER — Inpatient Hospital Stay (HOSPITAL_COMMUNITY): Admitting: Certified Registered Nurse Anesthetist

## 2024-02-21 ENCOUNTER — Encounter (HOSPITAL_COMMUNITY)
Admission: EM | Disposition: A | Discharge status: Home / Self Care | Payer: Self-pay | Source: Home / Self Care | Attending: Internal Medicine

## 2024-02-21 DIAGNOSIS — N10 Acute tubulo-interstitial nephritis: Secondary | ICD-10-CM | POA: Diagnosis not present

## 2024-02-21 DIAGNOSIS — E782 Mixed hyperlipidemia: Secondary | ICD-10-CM | POA: Diagnosis not present

## 2024-02-21 DIAGNOSIS — Z87891 Personal history of nicotine dependence: Secondary | ICD-10-CM

## 2024-02-21 DIAGNOSIS — N201 Calculus of ureter: Secondary | ICD-10-CM | POA: Diagnosis present

## 2024-02-21 DIAGNOSIS — A419 Sepsis, unspecified organism: Secondary | ICD-10-CM

## 2024-02-21 DIAGNOSIS — I10 Essential (primary) hypertension: Secondary | ICD-10-CM | POA: Diagnosis not present

## 2024-02-21 DIAGNOSIS — N132 Hydronephrosis with renal and ureteral calculous obstruction: Secondary | ICD-10-CM | POA: Diagnosis not present

## 2024-02-21 DIAGNOSIS — N134 Hydroureter: Secondary | ICD-10-CM

## 2024-02-21 HISTORY — PX: CYSTOSCOPY WITH RETROGRADE PYELOGRAM, URETEROSCOPY AND STENT PLACEMENT: SHX5789

## 2024-02-21 HISTORY — PX: CYSTOSCOPY/RETROGRADE/URETEROSCOPY/STONE EXTRACTION WITH BASKET: SHX5317

## 2024-02-21 LAB — CBC WITH DIFFERENTIAL/PLATELET
Abs Immature Granulocytes: 0.12 10*3/uL — ABNORMAL HIGH (ref 0.00–0.07)
Basophils Absolute: 0.1 10*3/uL (ref 0.0–0.1)
Basophils Relative: 0 %
Eosinophils Absolute: 0 10*3/uL (ref 0.0–0.5)
Eosinophils Relative: 0 %
HCT: 42.2 % (ref 39.0–52.0)
Hemoglobin: 14.1 g/dL (ref 13.0–17.0)
Immature Granulocytes: 1 %
Lymphocytes Relative: 8 %
Lymphs Abs: 1.7 10*3/uL (ref 0.7–4.0)
MCH: 33.5 pg (ref 26.0–34.0)
MCHC: 33.4 g/dL (ref 30.0–36.0)
MCV: 100.2 fL — ABNORMAL HIGH (ref 80.0–100.0)
Monocytes Absolute: 1.7 10*3/uL — ABNORMAL HIGH (ref 0.1–1.0)
Monocytes Relative: 8 %
Neutro Abs: 16.8 10*3/uL — ABNORMAL HIGH (ref 1.7–7.7)
Neutrophils Relative %: 83 %
Platelets: 230 10*3/uL (ref 150–400)
RBC: 4.21 MIL/uL — ABNORMAL LOW (ref 4.22–5.81)
RDW: 12.5 % (ref 11.5–15.5)
WBC: 20.4 10*3/uL — ABNORMAL HIGH (ref 4.0–10.5)
nRBC: 0 % (ref 0.0–0.2)

## 2024-02-21 LAB — COMPREHENSIVE METABOLIC PANEL WITH GFR
ALT: 37 U/L (ref 0–44)
AST: 32 U/L (ref 15–41)
Albumin: 3.5 g/dL (ref 3.5–5.0)
Alkaline Phosphatase: 55 U/L (ref 38–126)
Anion gap: 12 (ref 5–15)
BUN: 20 mg/dL (ref 8–23)
CO2: 21 mmol/L — ABNORMAL LOW (ref 22–32)
Calcium: 9 mg/dL (ref 8.9–10.3)
Chloride: 105 mmol/L (ref 98–111)
Creatinine, Ser: 0.64 mg/dL (ref 0.61–1.24)
GFR, Estimated: >60 mL/min (ref >60)
Glucose, Bld: 116 mg/dL — ABNORMAL HIGH (ref 70–99)
Potassium: 3.4 mmol/L — ABNORMAL LOW (ref 3.5–5.1)
Sodium: 138 mmol/L (ref 135–145)
Total Bilirubin: 1.3 mg/dL — ABNORMAL HIGH (ref 0.0–1.2)
Total Protein: 6.4 g/dL — ABNORMAL LOW (ref 6.5–8.1)

## 2024-02-21 LAB — MAGNESIUM: Magnesium: 1.9 mg/dL (ref 1.7–2.4)

## 2024-02-21 LAB — PHOSPHORUS: Phosphorus: 2.3 mg/dL — ABNORMAL LOW (ref 2.5–4.6)

## 2024-02-21 SURGERY — CYSTOURETEROSCOPY, WITH RETROGRADE PYELOGRAM AND STENT INSERTION
Anesthesia: General | Site: Ureter | Laterality: Left

## 2024-02-21 MED ORDER — PRAVASTATIN SODIUM 20 MG PO TABS
20.0000 mg | ORAL_TABLET | Freq: Every day | ORAL | Status: DC
  Administered 2024-02-21 – 2024-02-23 (×3): 20 mg via ORAL
  Filled 2024-02-21 (×3): qty 1

## 2024-02-21 MED ORDER — HYDROMORPHONE HCL 1 MG/ML IJ SOLN: 0.5000 mg | INTRAMUSCULAR | Status: DC | PRN

## 2024-02-21 MED ORDER — ONDANSETRON HCL 4 MG/2ML IJ SOLN
INTRAMUSCULAR | Status: DC | PRN
  Administered 2024-02-21: 4 mg via INTRAVENOUS

## 2024-02-21 MED ORDER — FENTANYL CITRATE (PF) 100 MCG/2ML IJ SOLN
INTRAMUSCULAR | Status: AC
  Filled 2024-02-21: qty 2

## 2024-02-21 MED ORDER — ACETAMINOPHEN 325 MG PO TABS
650.0000 mg | ORAL_TABLET | Freq: Four times a day (QID) | ORAL | Status: DC | PRN
Start: 2024-02-21 — End: 2024-02-23
  Administered 2024-02-21: 650 mg via ORAL
  Filled 2024-02-21: qty 2

## 2024-02-21 MED ORDER — LACTATED RINGERS IV SOLN: INTRAVENOUS | Status: DC | PRN

## 2024-02-21 MED ORDER — ONDANSETRON HCL 4 MG PO TABS: 4.0000 mg | ORAL_TABLET | Freq: Four times a day (QID) | ORAL | Status: DC | PRN

## 2024-02-21 MED ORDER — IOHEXOL 300 MG/ML  SOLN
INTRAMUSCULAR | Status: DC | PRN
  Administered 2024-02-21: 6 mL

## 2024-02-21 MED ORDER — ACETAMINOPHEN 10 MG/ML IV SOLN
INTRAVENOUS | Status: AC
  Filled 2024-02-21: qty 100

## 2024-02-21 MED ORDER — TRAMADOL HCL 50 MG PO TABS
50.0000 mg | ORAL_TABLET | Freq: Every day | ORAL | Status: DC
  Administered 2024-02-21 – 2024-02-22 (×2): 50 mg via ORAL
  Filled 2024-02-21 (×3): qty 1

## 2024-02-21 MED ORDER — ONDANSETRON HCL 4 MG/2ML IJ SOLN: 4.0000 mg | Freq: Once | INTRAMUSCULAR | Status: DC | PRN

## 2024-02-21 MED ORDER — PROPOFOL 10 MG/ML IV BOLUS
INTRAVENOUS | Status: DC | PRN
  Administered 2024-02-21: 100 mg via INTRAVENOUS

## 2024-02-21 MED ORDER — K PHOS MONO-SOD PHOS DI & MONO 155-852-130 MG PO TABS
500.0000 mg | ORAL_TABLET | Freq: Every day | ORAL | Status: DC
  Administered 2024-02-21 – 2024-02-23 (×3): 500 mg via ORAL
  Filled 2024-02-21 (×3): qty 2

## 2024-02-21 MED ORDER — ADULT MULTIVITAMIN W/MINERALS CH
1.0000 | ORAL_TABLET | Freq: Every day | ORAL | Status: DC
  Administered 2024-02-21 – 2024-02-23 (×3): 1 via ORAL
  Filled 2024-02-21 (×3): qty 1

## 2024-02-21 MED ORDER — SODIUM CHLORIDE 0.9 % IV SOLN: INTRAVENOUS | Status: AC

## 2024-02-21 MED ORDER — ONDANSETRON HCL 4 MG/2ML IJ SOLN: 4.0000 mg | Freq: Four times a day (QID) | INTRAMUSCULAR | Status: DC | PRN

## 2024-02-21 MED ORDER — FENTANYL CITRATE (PF) 100 MCG/2ML IJ SOLN
INTRAMUSCULAR | Status: DC | PRN
  Administered 2024-02-21: 25 ug via INTRAVENOUS
  Administered 2024-02-21: 50 ug via INTRAVENOUS

## 2024-02-21 MED ORDER — OMEPRAZOLE MAGNESIUM 20 MG PO TBEC: 20.0000 mg | DELAYED_RELEASE_TABLET | Freq: Every day | ORAL | Status: DC

## 2024-02-21 MED ORDER — SODIUM CHLORIDE 0.9 % IV SOLN: Freq: Once | INTRAVENOUS | Status: AC

## 2024-02-21 MED ORDER — PANTOPRAZOLE SODIUM 40 MG PO TBEC
40.0000 mg | DELAYED_RELEASE_TABLET | Freq: Every day | ORAL | Status: DC
  Administered 2024-02-21 – 2024-02-23 (×3): 40 mg via ORAL
  Filled 2024-02-21 (×3): qty 1

## 2024-02-21 MED ORDER — FINASTERIDE 5 MG PO TABS
5.0000 mg | ORAL_TABLET | Freq: Every day | ORAL | Status: DC
  Administered 2024-02-21 – 2024-02-23 (×3): 5 mg via ORAL
  Filled 2024-02-21 (×3): qty 1

## 2024-02-21 MED ORDER — FENTANYL CITRATE PF 50 MCG/ML IJ SOSY: 25.0000 ug | PREFILLED_SYRINGE | INTRAMUSCULAR | Status: DC | PRN

## 2024-02-21 MED ORDER — PHENYLEPHRINE 80 MCG/ML (10ML) SYRINGE FOR IV PUSH (FOR BLOOD PRESSURE SUPPORT)
PREFILLED_SYRINGE | INTRAVENOUS | Status: DC | PRN
  Administered 2024-02-21 (×3): 80 ug via INTRAVENOUS
  Administered 2024-02-21: 160 ug via INTRAVENOUS

## 2024-02-21 MED ORDER — LIDOCAINE 2% (20 MG/ML) 5 ML SYRINGE
INTRAMUSCULAR | Status: DC | PRN
  Administered 2024-02-21: 80 mg via INTRAVENOUS

## 2024-02-21 MED ORDER — TAMSULOSIN HCL 0.4 MG PO CAPS
0.4000 mg | ORAL_CAPSULE | Freq: Every day | ORAL | Status: DC
  Administered 2024-02-21 – 2024-02-22 (×3): 0.4 mg via ORAL
  Filled 2024-02-21 (×3): qty 1

## 2024-02-21 MED ORDER — HYDROCODONE-ACETAMINOPHEN 5-325 MG PO TABS
1.0000 | ORAL_TABLET | ORAL | Status: DC | PRN
  Administered 2024-02-21 – 2024-02-22 (×2): 1 via ORAL
  Filled 2024-02-21 (×2): qty 1

## 2024-02-21 MED ORDER — ACETAMINOPHEN 650 MG RE SUPP: 650.0000 mg | Freq: Four times a day (QID) | RECTAL | Status: DC | PRN

## 2024-02-21 MED ORDER — ACETAMINOPHEN 10 MG/ML IV SOLN
INTRAVENOUS | Status: DC | PRN
  Administered 2024-02-21: 1000 mg via INTRAVENOUS

## 2024-02-21 MED ORDER — CYCLOBENZAPRINE HCL 5 MG PO TABS: 5.0000 mg | ORAL_TABLET | Freq: Two times a day (BID) | ORAL | Status: DC | PRN

## 2024-02-21 SURGICAL SUPPLY — 14 items
BAG URO CATCHER STRL LF (MISCELLANEOUS) ×3 IMPLANT
BASKET ZERO TIP NITINOL 2.4FR (BASKET) IMPLANT
CATH URETL OPEN END 6FR 70 (CATHETERS) IMPLANT
CLOTH BEACON ORANGE TIMEOUT ST (SAFETY) ×3 IMPLANT
GLOVE SURG LX STRL 7.5 STRW (GLOVE) ×3 IMPLANT
GOWN STRL REUS W/ TWL XL LVL3 (GOWN DISPOSABLE) ×3 IMPLANT
GUIDEWIRE ANG ZIPWIRE 038X150 (WIRE) IMPLANT
GUIDEWIRE STR DUAL SENSOR (WIRE) ×3 IMPLANT
KIT TURNOVER KIT A (KITS) ×3 IMPLANT
MANIFOLD NEPTUNE II (INSTRUMENTS) ×3 IMPLANT
PACK CYSTO (CUSTOM PROCEDURE TRAY) ×3 IMPLANT
STENT URET 6FRX26 CONTOUR (STENTS) IMPLANT
TUBING CONNECTING 10 (TUBING) ×3 IMPLANT
TUBING UROLOGY SET (TUBING) IMPLANT

## 2024-02-21 NOTE — Progress Notes (Signed)
 SATURATION QUALIFICATIONS: (This note is used to comply with regulatory documentation for home oxygen)  Patient Saturations on Room Air at Rest = 97%  Patient Saturations on Room Air while Ambulating = 97%  Patient Saturations on 0 Liters of oxygen while Ambulating = 97%  Please briefly explain why patient needs home oxygen: no O2 needed

## 2024-02-21 NOTE — Op Note (Signed)
 Preoperative diagnosis: Left distal ureteral stone, hydronephrosis, SIRS Postoperative diagnosis: Same  Procedure: Cystoscopy with left retrograde pyelogram, left ureteroscopy stone basket extraction and left ureteral stent placement with tether  Surgeon: Nieves  Anesthesia: General  Indication for procedure: Andrew Foster is an 83 year old male who had flank pain elevated white count, UA with many bacteria and a temp of 100.8.  He improved after resuscitation and was monitored overnight but continued to have urinary frequency and urgency and did not see a stone pass.  Also his white count rose and he was brought for the above procedure.  Findings: On exam under anesthesia the penis was circumcised without mass or lesion.  The glans and meatus appeared normal.  The scrotum appeared normal.  On DRE prostate was about 60 g and smooth without hard area or nodule.  On cystoscopy the urethra was unremarkable, prostate was borderline obstructive with a slightly high bladder neck or intravesical extension.  This slightly J-hook the ureters.  There was no stone or foreign body in the bladder.  Bladder was moderately trabeculated no mucosal lesions were noted.  Left retrograde pyelogram-this outlined a single ureter single collecting system unit with a filling defect in the distal ureter consistent with the stone and proximal hydroureteronephrosis.  No other filling defects noted.  Left ureteroscopy confirmed a small stone in the left distal ureter which was extracted without difficulty.  Description of procedure: After consent was obtained patient brought to the operating room.  After adequate anesthesia he was placed in lithotomy position and prepped and draped in the usual sterile fashion.  Timeout was performed to confirm the patient and procedure.  Cystoscope was passed per urethra and the bladder was inspected.  Left ureteral orifice was cannulated with a 6 [place] open-ended catheter and left retrograde  injection of contrast was performed.  A sensor wire was then advanced into the collecting system and the scope was backed out after the bladder was drained.  A long single channel semirigid ureteroscope was advanced adjacent to the wire without difficulty into the distal ureter where the stone was noted and it was grasped with a ZeroTip basket and extracted without any resistance.  This was sent to office lab.  Reinspection of the ureter up into the mid ureter noted there to be no other stones and no injury.  The scope was backed out the wire was backloaded on the cystoscope and a 626 cm stent advanced.  The wire was removed with a good coil seen in the kidney and a good coil in the bladder.  The bladder was drained and the scope removed.  He was awakened and taken to the recovery room in stable condition.  Complications: None  Blood loss: Minimal  Specimens: Stone to office lab  Disposition: Patient stable to PACU.

## 2024-02-21 NOTE — Progress Notes (Signed)
 S: Patient complains of continued urgency to void and frequency.  His flank pain improved.  He has been straining all night and has not seen a stone pass.  O: Vitals:   02/21/24 0457 02/21/24 0705  BP: 111/62 121/68  Pulse: 78 80  Resp: 18 19  Temp: 99 F (37.2 C) 99.8 F (37.7 C)  SpO2: 96% 94%   He is alert and oriented in the preop area Abdomen soft and nontender No flank pain  CBC    Component Value Date/Time   WBC 20.4 (H) 02/21/2024 0411   RBC 4.21 (L) 02/21/2024 0411   HGB 14.1 02/21/2024 0411   HCT 42.2 02/21/2024 0411   PLT 230 02/21/2024 0411   MCV 100.2 (H) 02/21/2024 0411   MCH 33.5 02/21/2024 0411   MCHC 33.4 02/21/2024 0411   RDW 12.5 02/21/2024 0411   LYMPHSABS 1.7 02/21/2024 0411   MONOABS 1.7 (H) 02/21/2024 0411   EOSABS 0.0 02/21/2024 0411   BASOSABS 0.1 02/21/2024 0411   Lab Results  Component Value Date   CREATININE 0.64 02/21/2024   CREATININE 0.81 02/20/2024   CREATININE 0.91 10/16/2022   A/P: Small left UVJ stone with continued urgency and increase in his white count.  Has not seen a stone pass.  Temp 99.  He denies any other symptoms like cough, congestion or diarrhea. I discussed with the patient the nature, potential benefits, risks and alternatives to cystoscopy with left retrograde pyelogram, left ureteral stent, left ureteroscopy possible laser or stone basket extraction, including side effects of the proposed treatment, the likelihood of the patient achieving the goals of the procedure, and any potential problems that might occur during the procedure or recuperation.  Given the stone size and the stability we make an attempt to remove it.  Discussed he may need a staged procedure with a stent today and follow-up ureteroscopy.  Also discussed he may have passed the stone.  All questions answered. Patient elects to proceed.

## 2024-02-21 NOTE — Assessment & Plan Note (Addendum)
 Continue Pravachol  20 mg daily when able to tolerate

## 2024-02-21 NOTE — Assessment & Plan Note (Signed)
 Now resolved.

## 2024-02-21 NOTE — Assessment & Plan Note (Signed)
-  SIRS criteria met with  elevated white blood cell count,       Component Value Date/Time   WBC 18.3 (H) 02/20/2024 1914   LYMPHSABS 1.0 02/20/2024 1914       fever   RR >20 Today's Vitals   02/20/24 2000 02/20/24 2100 02/20/24 2221 02/20/24 2304  BP: 104/69 110/61  112/72  Pulse: 76 74 70 69  Resp: 14 14 10 17   Temp:   98.5 F (36.9 C) 98.4 F (36.9 C)  TempSrc:   Oral Oral  SpO2: 94% 93% 94% 94%  PainSc:          -Most likely source being:  urinary        - Obtain serial lactic acid and procalcitonin level.  - Initiated IV antibiotics in ER: Antibiotics Given (last 72 hours)     Date/Time Action Medication Dose Rate   02/20/24 2116 New Bag/Given   cefTRIAXone  (ROCEPHIN ) 2 g in sodium chloride  0.9 % 100 mL IVPB 2 g 200 mL/hr       Will continue  on : Rocephin    - await results of blood and urine culture  - Rehydrate aggressively  Intravenous fluids were administered,      12:06 AM

## 2024-02-21 NOTE — Discharge Instructions (Signed)
 Remove stent on Wednesday morning, February 24, 2024 by pulling the string gently as instructed.

## 2024-02-21 NOTE — Assessment & Plan Note (Addendum)
 Continue Rocephin  await results of urine culture Appreciate urology consult at this point plan to proceed to the OR in the morning.  Of note patient does have chronic leukocytosis

## 2024-02-21 NOTE — Anesthesia Preprocedure Evaluation (Signed)
 Anesthesia Evaluation  Patient identified by MRN, date of birth, ID band Patient awake    Reviewed: Allergy & Precautions, NPO status , Patient's Chart, lab work & pertinent test results  Airway Mallampati: II  TM Distance: >3 FB Neck ROM: Full    Dental  (+) Dental Advisory Given, Implants, Upper Dentures,    Pulmonary former smoker   Pulmonary exam normal breath sounds clear to auscultation       Cardiovascular hypertension, (-) angina (-) Past MI negative cardio ROS Normal cardiovascular exam Rhythm:Regular Rate:Normal     Neuro/Psych negative neurological ROS  negative psych ROS   GI/Hepatic Neg liver ROS,GERD  Medicated and Controlled,,  Endo/Other  negative endocrine ROS    Renal/GU Renal InsufficiencyRenal disease     Musculoskeletal  (+) Arthritis ,    Abdominal   Peds  Hematology negative hematology ROS (+)   Anesthesia Other Findings Day of surgery medications reviewed with the patient.  Reproductive/Obstetrics                             Anesthesia Physical Anesthesia Plan  ASA: 3  Anesthesia Plan: General   Post-op Pain Management: Ofirmev  IV (intra-op)*   Induction: Intravenous  PONV Risk Score and Plan: 3 and Dexamethasone and Ondansetron   Airway Management Planned: LMA  Additional Equipment:   Intra-op Plan:   Post-operative Plan: Extubation in OR  Informed Consent: I have reviewed the patients History and Physical, chart, labs and discussed the procedure including the risks, benefits and alternatives for the proposed anesthesia with the patient or authorized representative who has indicated his/her understanding and acceptance.     Dental advisory given  Plan Discussed with: CRNA  Anesthesia Plan Comments:        Anesthesia Quick Evaluation

## 2024-02-21 NOTE — Plan of Care (Signed)
   Problem: Coping: Goal: Level of anxiety will decrease Outcome: Progressing   Problem: Pain Managment: Goal: General experience of comfort will improve and/or be controlled Outcome: Progressing

## 2024-02-21 NOTE — Progress Notes (Signed)
 Patient status post left ureteroscopy with stone basket extraction and stent placement with string.  Once he is stable with improvement in his temp and white count, and cultures are known he may be discharged on p.o. antibiotics.    Appreciate excellent hospitalist care.  Patient has a string on the stent and he may remove the stent on Wednesday morning.  Instructions and follow-up on chart.  Office will call with follow-up appointment.

## 2024-02-21 NOTE — Assessment & Plan Note (Addendum)
 Urology is aware will see patient in consult Patient was seen in the emergency department at this point pain resolved Plan was to possibly go to the OR in the morning Recommend n.p.o. postmidnight

## 2024-02-21 NOTE — Progress Notes (Signed)
 Progress Note   Patient: Andrew Foster FMW:[id number] DOB: 12/06/1940 DOA: 02/20/2024     1 DOS: the patient was seen and examined on 02/21/2024   Brief hospital course: OSAZE Andrew Foster is a 83 y.o. male with medical history significant of BPH, kidney stones, chronic diarrhea, knee arthritis presented with  left flank pain.  Patient had fever of 100.8, urinary frequency and chills.  CT abdomen pelvis showed obstructing left ureterovesicular junction stone resulting in hydroureteronephrosis.  Urology consulted admitted to [facility] service for further management.  Patient had left ureteroscopy with stone basket extraction and stent placement by urology 02/21/2024.  Assessment and Plan: Sepsis due to acute pyelonephritis Left ureteral stone. He presented with fever, white count, border line low BP.  CT stone study showing obstructing left ureterovesicular junction resulting in left hydroureteronephrosis. Urology evaluation appreciated.  Patient got left ureteroscopy with stone basket extraction and stent placement. Continue rocephin  therapy. Follow urine cultures. Follow urology recommendations.  BPH: Continue flomax , Proscar.  Arthritis Chronic stable pain controlled.  Hyperlipidemia- Continue statin.      Out of bed to chair. Incentive spirometry. Nursing supportive care. Fall, aspiration precautions. Diet:  Diet Orders (From admission, onward)     Start     Ordered   02/21/24 1012  Diet regular Fluid consistency: Thin  Diet effective now       Question:  Fluid consistency:  Answer:  Thin   02/21/24 1011           DVT prophylaxis: SCDs Start: 02/21/24 0012 Place and maintain sequential compression device Start: 02/20/24 2206  Level of care: Progressive   Code Status: Full Code  Subjective: Patient is seen and examined today morning after urology procedure. He denies any pain. Wishes to eat. No other complaints.   Physical Exam: Vitals:   02/21/24 0915 02/21/24 0931  02/21/24 0949 02/21/24 1040  BP: 101/61 (!) 100/59 107/62   Pulse: 68 66 65   Resp: 10 11 16    Temp:   98.3 F (36.8 C)   TempSrc:   Oral   SpO2: 93% 93% 96% 92%  Weight:      Height:        General - Elderly Caucasian male, no apparent distress HEENT - PERRLA, EOMI, atraumatic head, non tender sinuses. Lung - Clear, basal rhonchi, no wheezes. Heart - S1, S2 heard, no murmurs, rubs, trace pedal edema. Abdomen - Soft, non tender, bowel sounds good Neuro - Alert, awake and oriented x 3, non focal exam. Skin - Warm and dry.  Data Reviewed:      Latest Ref Rng & Units 02/21/2024    4:11 AM 02/20/2024    7:14 PM 10/18/2022    4:03 AM  CBC  WBC 4.0 - 10.5 K/uL 20.4  18.3  14.8   Hemoglobin 13.0 - 17.0 g/dL 85.8  85.1  86.4   Hematocrit 39.0 - 52.0 % 42.2  43.0  39.3   Platelets 150 - 400 K/uL 230  259  205       Latest Ref Rng & Units 02/21/2024    4:10 AM 02/20/2024    7:14 PM 10/16/2022    2:29 AM  BMP  Glucose 70 - 99 mg/dL 883  888  883   BUN 8 - 23 mg/dL 20  27  19    Creatinine 0.61 - 1.24 mg/dL 9.35  9.18  9.08   Sodium 135 - 145 mmol/L 138  136  135   Potassium 3.5 - 5.1 mmol/L  3.4  4.1  4.4   Chloride 98 - 111 mmol/L 105  102  101   CO2 22 - 32 mmol/L 21  22  23    Calcium 8.9 - 10.3 mg/dL 9.0  9.3  8.8    DG C-Arm 1-60 Min-No Report Result Date: 02/21/2024 Fluoroscopy was utilized by the requesting physician.  No radiographic interpretation.   CT Renal Stone Study Result Date: 02/20/2024 CLINICAL DATA:  Abdominal and flank pain EXAM: CT ABDOMEN AND PELVIS WITHOUT CONTRAST TECHNIQUE: Multidetector CT imaging of the abdomen and pelvis was performed following the standard protocol without IV contrast. RADIATION DOSE REDUCTION: This exam was performed according to the departmental dose-optimization program which includes automated exposure control, adjustment of the mA and/or kV according to patient size and/or use of iterative reconstruction technique. COMPARISON:   10/15/2022 FINDINGS: Lower chest: No acute abnormality. Extensive left anterior descending coronary artery calcification Hepatobiliary: No focal liver abnormality is seen. No gallstones, gallbladder wall thickening, or biliary dilatation. Pancreas: Unremarkable Spleen: Unremarkable Adrenals/Urinary Tract: The adrenal glands are unremarkable. The kidneys are normal in size and position. Stable simple cortical cyst within the upper pole of the right kidney. There is mild left hydronephrosis and hydroureter to the level of the left ureterovesicular junction where an obstructing 2 x 3 x 5 mm calculus is identified. No additional renal or ureteral calculi. No hydronephrosis on the right. The bladder is. Stomach/Bowel: Severe sigmoid diverticulosis. Stomach, small bowel, and large bowel are otherwise unremarkable. Appendix normal. No evidence of obstruction or focal inflammation. No free intraperitoneal gas or fluid. Vascular/Lymphatic: Aortic atherosclerosis. No enlarged abdominal or pelvic lymph nodes. Reproductive: Moderate prostatic hypertrophy Other: No abdominal wall hernia or abnormality. No abdominopelvic ascites. Musculoskeletal: No acute bone abnormality. No lytic or blastic bone lesion. Osseous structures are age appropriate. Advanced degenerative changes within the lumbar spine result in moderate to severe bilateral neuroforaminal narrowing at L1-2 and L2-3, most severe on the right at L1-2. IMPRESSION: 1. Obstructing 2 x 3 x 5 mm calculus at the left ureterovesicular junction resulting in mild left hydronephrosis and hydroureter. 2. Severe sigmoid diverticulosis. 3. Aortic atherosclerosis. Aortic Atherosclerosis (ICD10-I70.0). Electronically Signed   By: Dorethia Molt M.D.   On: 02/20/2024 21:36   DG Chest Port 1 View Result Date: 02/20/2024 CLINICAL DATA:  Sepsis EXAM: PORTABLE CHEST 1 VIEW COMPARISON:  None Available. FINDINGS: Lungs volumes are small, but are symmetric and are clear. No pneumothorax or  pleural effusion. Cardiac size within normal limits. Pulmonary vascularity is normal. Osseous structures are age-appropriate. No acute bone abnormality. IMPRESSION: 1. Pulmonary hypoinflation. Electronically Signed   By: Dorethia Molt M.D.   On: 02/20/2024 19:24    Family Communication: Discussed with patient, he understand and agree. All questions answered.  Disposition: Status is: Inpatient Remains inpatient appropriate because: s/p left ureteral stent placement, IV antibiotics.  Planned Discharge Destination: Home     Time spent: 41 minutes  Author: Concepcion Riser, MD 02/21/2024 1:21 PM Secure chat 7am to 7pm For on call review [email or web address].

## 2024-02-21 NOTE — Anesthesia Postprocedure Evaluation (Signed)
 Anesthesia Post Note  Patient: Andrew Foster  Procedure(s) Performed: ERNESTA, WITH RETROGRADE PYELOGRAM AND STENT INSERTION (Left) CYSTOSCOPY, WITH CALCULUS REMOVAL USING BASKET (Left: Ureter)     Patient location during evaluation: PACU Anesthesia Type: General Level of consciousness: awake and alert Pain management: pain level controlled Vital Signs Assessment: post-procedure vital signs reviewed and stable Respiratory status: spontaneous breathing, nonlabored ventilation and respiratory function stable Cardiovascular status: blood pressure returned to baseline and stable Postop Assessment: no apparent nausea or vomiting Anesthetic complications: no   No notable events documented.  Last Vitals:  Vitals:   02/21/24 0915 02/21/24 0931  BP: 101/61 (!) 100/59  Pulse: 68 66  Resp: 10 11  Temp:    SpO2: 93% 93%    Last Pain:  Vitals:   02/21/24 0931  TempSrc:   PainSc: 0-No pain                 Garnette FORBES Skillern

## 2024-02-21 NOTE — Transfer of Care (Signed)
 Immediate Anesthesia Transfer of Care Note  Patient: Andrew Foster  Procedure(s) Performed: ERNESTA, WITH RETROGRADE PYELOGRAM AND STENT INSERTION (Left) CYSTOSCOPY, WITH CALCULUS REMOVAL USING BASKET (Left: Ureter)  Patient Location: PACU  Anesthesia Type:General  Level of Consciousness: awake  Airway & Oxygen Therapy: Patient Spontanous Breathing and Patient connected to nasal cannula oxygen  Post-op Assessment: Report given to RN and Post -op Vital signs reviewed and stable  Post vital signs: Reviewed and stable  Last Vitals:  Vitals Value Taken Time  BP 108/65 02/21/24 09:00  Temp    Pulse 75 02/21/24 09:03  Resp 11 02/21/24 09:03  SpO2 94 % 02/21/24 09:03  Vitals shown include unfiled device data.  Last Pain:  Vitals:   02/21/24 0457  TempSrc: Oral  PainSc:          Complications: No notable events documented.

## 2024-02-21 NOTE — Assessment & Plan Note (Signed)
 Chronic stable pain controlled

## 2024-02-21 NOTE — Anesthesia Procedure Notes (Signed)
 Procedure Name: LMA Insertion Date/Time: 02/21/2024 8:13 AM  Performed by: Cena Epps, CRNAPre-anesthesia Checklist: Patient identified, Emergency Drugs available, Suction available and Patient being monitored Patient Re-evaluated:Patient Re-evaluated prior to induction Oxygen Delivery Method: Circle System Utilized Preoxygenation: Pre-oxygenation with 100% oxygen Induction Type: IV induction Ventilation: Mask ventilation without difficulty LMA: LMA inserted LMA Size: 4.0 Number of attempts: 1 Airway Equipment and Method: Bite block Placement Confirmation: positive ETCO2 Tube secured with: Tape Dental Injury: Teeth and Oropharynx as per pre-operative assessment

## 2024-02-22 ENCOUNTER — Encounter (HOSPITAL_COMMUNITY): Payer: Self-pay | Admitting: Urology

## 2024-02-22 DIAGNOSIS — A419 Sepsis, unspecified organism: Secondary | ICD-10-CM | POA: Diagnosis not present

## 2024-02-22 DIAGNOSIS — N10 Acute tubulo-interstitial nephritis: Secondary | ICD-10-CM | POA: Diagnosis not present

## 2024-02-22 DIAGNOSIS — N201 Calculus of ureter: Secondary | ICD-10-CM | POA: Diagnosis not present

## 2024-02-22 DIAGNOSIS — E782 Mixed hyperlipidemia: Secondary | ICD-10-CM | POA: Diagnosis not present

## 2024-02-22 LAB — BASIC METABOLIC PANEL WITH GFR
Anion gap: 7 (ref 5–15)
BUN: 16 mg/dL (ref 8–23)
CO2: 24 mmol/L (ref 22–32)
Calcium: 8.2 mg/dL — ABNORMAL LOW (ref 8.9–10.3)
Chloride: 104 mmol/L (ref 98–111)
Creatinine, Ser: 0.83 mg/dL (ref 0.61–1.24)
GFR, Estimated: >60 mL/min (ref >60)
Glucose, Bld: 172 mg/dL — ABNORMAL HIGH (ref 70–99)
Potassium: 3.5 mmol/L (ref 3.5–5.1)
Sodium: 135 mmol/L (ref 135–145)

## 2024-02-22 LAB — CBC
HCT: 39.7 % (ref 39.0–52.0)
Hemoglobin: 12.8 g/dL — ABNORMAL LOW (ref 13.0–17.0)
MCH: 33.2 pg (ref 26.0–34.0)
MCHC: 32.2 g/dL (ref 30.0–36.0)
MCV: 102.8 fL — ABNORMAL HIGH (ref 80.0–100.0)
Platelets: 195 10*3/uL (ref 150–400)
RBC: 3.86 MIL/uL — ABNORMAL LOW (ref 4.22–5.81)
RDW: 12.7 % (ref 11.5–15.5)
WBC: 11.1 10*3/uL — ABNORMAL HIGH (ref 4.0–10.5)
nRBC: 0 % (ref 0.0–0.2)

## 2024-02-22 LAB — PHOSPHORUS: Phosphorus: 2.3 mg/dL — ABNORMAL LOW (ref 2.5–4.6)

## 2024-02-22 NOTE — Plan of Care (Signed)
  Problem: Clinical Measurements: Goal: Diagnostic test results will improve Outcome: Progressing   Problem: Education: Goal: Knowledge of General Education information will improve Description: Including pain rating scale, medication(s)/side effects and non-pharmacologic comfort measures Outcome: Progressing   Problem: Clinical Measurements: Goal: Diagnostic test results will improve Outcome: Progressing   Problem: Nutrition: Goal: Adequate nutrition will be maintained Outcome: Progressing   Problem: Coping: Goal: Level of anxiety will decrease Outcome: Progressing   Problem: Pain Managment: Goal: General experience of comfort will improve and/or be controlled Outcome: Progressing

## 2024-02-22 NOTE — Progress Notes (Signed)
 Progress Note   Patient: Andrew Foster FMW:[id number] DOB: 01/30/1941 DOA: 02/20/2024     2 DOS: the patient was seen and examined on 02/22/2024   Brief hospital course: WHITTEN ANDREONI is a 83 y.o. male with medical history significant of BPH, kidney stones, chronic diarrhea, knee arthritis presented with  left flank pain.  Patient had fever of 100.8, urinary frequency and chills.  CT abdomen pelvis showed obstructing left ureterovesicular junction stone resulting in hydroureteronephrosis.  Urology consulted admitted to [facility] service for further management.  Patient had left ureteroscopy with stone basket extraction and stent placement by urology 02/21/2024.  Assessment and Plan: Sepsis due to acute pyelonephritis Left ureteral stone. He presented with fever, white count, border line low BP.  CT stone study showing obstructing left ureterovesicular junction resulting in left hydroureteronephrosis. Urology evaluation and follow-up appreciated.  Patient got left ureteroscopy with stone basket extraction and stent placement. Continue rocephin  therapy. Follow urine cultures/sensitivities. Patient had fever last night. Possible discharge tomorrow on oral antibiotic therapy if remains afebrile.  BPH: Continue flomax , Proscar.  Arthritis Chronic stable pain controlled.  Hyperlipidemia- Continue statin.      Out of bed to chair. Incentive spirometry. Nursing supportive care. Fall, aspiration precautions. Diet:  Diet Orders (From admission, onward)     Start     Ordered   02/21/24 1012  Diet regular Fluid consistency: Thin  Diet effective now       Question:  Fluid consistency:  Answer:  Thin   02/21/24 1011           DVT prophylaxis: SCDs Start: 02/21/24 0012 Place and maintain sequential compression device Start: 02/20/24 2206  Level of care: Progressive   Code Status: Full Code  Subjective: Patient is seen and examined today morning. He did complain of lower abdominal  discomfort with urination.  Tmax 102.5.  Encourage out of bed.  Physical Exam: Vitals:   02/22/24 0341 02/22/24 0831 02/22/24 1240 02/22/24 1246  BP: (!) 102/53 109/61 109/68 109/68  Pulse: 73 72 68 68  Resp: 16   16  Temp: 99.9 F (37.7 C) 98.6 F (37 C) 98 F (36.7 C) 98 F (36.7 C)  TempSrc: Oral Oral Oral Oral  SpO2: 93% 93% 94% 95%  Weight:      Height:        General - Elderly Caucasian male, no apparent distress HEENT - PERRLA, EOMI, atraumatic head, non tender sinuses. Lung - Clear, basal rhonchi, no wheezes. Heart - S1, S2 heard, no murmurs, rubs, trace pedal edema. Abdomen - Soft, non tender, bowel sounds good Neuro - Alert, awake and oriented x 3, non focal exam. Skin - Warm and dry.  Data Reviewed:      Latest Ref Rng & Units 02/22/2024    3:48 AM 02/21/2024    4:11 AM 02/20/2024    7:14 PM  CBC  WBC 4.0 - 10.5 K/uL 11.1  20.4  18.3   Hemoglobin 13.0 - 17.0 g/dL 87.1  85.8  85.1   Hematocrit 39.0 - 52.0 % 39.7  42.2  43.0   Platelets 150 - 400 K/uL 195  230  259       Latest Ref Rng & Units 02/22/2024    3:48 AM 02/21/2024    4:10 AM 02/20/2024    7:14 PM  BMP  Glucose 70 - 99 mg/dL 827  883  888   BUN 8 - 23 mg/dL 16  20  27    Creatinine 0.61 -  1.24 mg/dL 9.16  9.35  9.18   Sodium 135 - 145 mmol/L 135  138  136   Potassium 3.5 - 5.1 mmol/L 3.5  3.4  4.1   Chloride 98 - 111 mmol/L 104  105  102   CO2 22 - 32 mmol/L 24  21  22    Calcium 8.9 - 10.3 mg/dL 8.2  9.0  9.3    DG C-Arm 1-60 Min-No Report Result Date: 02/21/2024 Fluoroscopy was utilized by the requesting physician.  No radiographic interpretation.   CT Renal Stone Study Result Date: 02/20/2024 CLINICAL DATA:  Abdominal and flank pain EXAM: CT ABDOMEN AND PELVIS WITHOUT CONTRAST TECHNIQUE: Multidetector CT imaging of the abdomen and pelvis was performed following the standard protocol without IV contrast. RADIATION DOSE REDUCTION: This exam was performed according to the departmental  dose-optimization program which includes automated exposure control, adjustment of the mA and/or kV according to patient size and/or use of iterative reconstruction technique. COMPARISON:  10/15/2022 FINDINGS: Lower chest: No acute abnormality. Extensive left anterior descending coronary artery calcification Hepatobiliary: No focal liver abnormality is seen. No gallstones, gallbladder wall thickening, or biliary dilatation. Pancreas: Unremarkable Spleen: Unremarkable Adrenals/Urinary Tract: The adrenal glands are unremarkable. The kidneys are normal in size and position. Stable simple cortical cyst within the upper pole of the right kidney. There is mild left hydronephrosis and hydroureter to the level of the left ureterovesicular junction where an obstructing 2 x 3 x 5 mm calculus is identified. No additional renal or ureteral calculi. No hydronephrosis on the right. The bladder is. Stomach/Bowel: Severe sigmoid diverticulosis. Stomach, small bowel, and large bowel are otherwise unremarkable. Appendix normal. No evidence of obstruction or focal inflammation. No free intraperitoneal gas or fluid. Vascular/Lymphatic: Aortic atherosclerosis. No enlarged abdominal or pelvic lymph nodes. Reproductive: Moderate prostatic hypertrophy Other: No abdominal wall hernia or abnormality. No abdominopelvic ascites. Musculoskeletal: No acute bone abnormality. No lytic or blastic bone lesion. Osseous structures are age appropriate. Advanced degenerative changes within the lumbar spine result in moderate to severe bilateral neuroforaminal narrowing at L1-2 and L2-3, most severe on the right at L1-2. IMPRESSION: 1. Obstructing 2 x 3 x 5 mm calculus at the left ureterovesicular junction resulting in mild left hydronephrosis and hydroureter. 2. Severe sigmoid diverticulosis. 3. Aortic atherosclerosis. Aortic Atherosclerosis (ICD10-I70.0). Electronically Signed   By: Dorethia Molt M.D.   On: 02/20/2024 21:36   DG Chest Port 1  View Result Date: 02/20/2024 CLINICAL DATA:  Sepsis EXAM: PORTABLE CHEST 1 VIEW COMPARISON:  None Available. FINDINGS: Lungs volumes are small, but are symmetric and are clear. No pneumothorax or pleural effusion. Cardiac size within normal limits. Pulmonary vascularity is normal. Osseous structures are age-appropriate. No acute bone abnormality. IMPRESSION: 1. Pulmonary hypoinflation. Electronically Signed   By: Dorethia Molt M.D.   On: 02/20/2024 19:24    Family Communication: Discussed with patient, he understand and agree. All questions answered.  Disposition: Status is: Inpatient Remains inpatient appropriate because: s/p left ureteral stent placement, IV antibiotics.  Planned Discharge Destination: Home     Time spent: 39 minutes  Author: Concepcion Riser, MD 02/22/2024 1:53 PM Secure chat 7am to 7pm For on call review [email or web address].

## 2024-02-22 NOTE — Progress Notes (Signed)
   02/22/24 0854  TOC Brief Assessment  Insurance and Status Reviewed  Patient has primary care physician Yes  Home environment has been reviewed single family home  Prior level of function: independent  Prior/Current Home Services No current home services  Social Drivers of Health Review SDOH reviewed no interventions necessary  Readmission risk has been reviewed Yes  Transition of care needs transition of care needs identified, TOC will continue to follow    Heather Saltness, MSW, LCSW 02/22/2024 8:55 AM

## 2024-02-23 ENCOUNTER — Other Ambulatory Visit (HOSPITAL_COMMUNITY): Payer: Self-pay

## 2024-02-23 DIAGNOSIS — E782 Mixed hyperlipidemia: Secondary | ICD-10-CM | POA: Diagnosis not present

## 2024-02-23 DIAGNOSIS — N10 Acute tubulo-interstitial nephritis: Secondary | ICD-10-CM | POA: Diagnosis not present

## 2024-02-23 DIAGNOSIS — E876 Hypokalemia: Secondary | ICD-10-CM

## 2024-02-23 DIAGNOSIS — N201 Calculus of ureter: Secondary | ICD-10-CM | POA: Diagnosis not present

## 2024-02-23 DIAGNOSIS — A419 Sepsis, unspecified organism: Secondary | ICD-10-CM | POA: Diagnosis not present

## 2024-02-23 LAB — BASIC METABOLIC PANEL WITH GFR
Anion gap: 11 (ref 5–15)
BUN: 17 mg/dL (ref 8–23)
CO2: 23 mmol/L (ref 22–32)
Calcium: 8.4 mg/dL — ABNORMAL LOW (ref 8.9–10.3)
Chloride: 102 mmol/L (ref 98–111)
Creatinine, Ser: 0.76 mg/dL (ref 0.61–1.24)
GFR, Estimated: >60 mL/min (ref >60)
Glucose, Bld: 131 mg/dL — ABNORMAL HIGH (ref 70–99)
Potassium: 3.3 mmol/L — ABNORMAL LOW (ref 3.5–5.1)
Sodium: 136 mmol/L (ref 135–145)

## 2024-02-23 LAB — CBC
HCT: 40.2 % (ref 39.0–52.0)
Hemoglobin: 13.2 g/dL (ref 13.0–17.0)
MCH: 33 pg (ref 26.0–34.0)
MCHC: 32.8 g/dL (ref 30.0–36.0)
MCV: 100.5 fL — ABNORMAL HIGH (ref 80.0–100.0)
Platelets: 211 10*3/uL (ref 150–400)
RBC: 4 MIL/uL — ABNORMAL LOW (ref 4.22–5.81)
RDW: 12.6 % (ref 11.5–15.5)
WBC: 10.2 10*3/uL (ref 4.0–10.5)
nRBC: 0 % (ref 0.0–0.2)

## 2024-02-23 LAB — PHOSPHORUS: Phosphorus: 1.8 mg/dL — ABNORMAL LOW (ref 2.5–4.6)

## 2024-02-23 MED ORDER — CEPHALEXIN 500 MG PO CAPS
500.0000 mg | ORAL_CAPSULE | Freq: Three times a day (TID) | ORAL | 0 refills | Status: AC
  Filled 2024-02-23: qty 21, 7d supply, fill #0

## 2024-02-23 MED ORDER — K PHOS MONO-SOD PHOS DI & MONO 155-852-130 MG PO TABS
500.0000 mg | ORAL_TABLET | Freq: Every day | ORAL | 0 refills | Status: AC
  Filled 2024-02-23: qty 20, 10d supply, fill #0

## 2024-02-23 NOTE — Discharge Summary (Incomplete)
 Physician Discharge Summary   Patient: Andrew Foster MRN: [id number] DOB: May 10, 1941  Admit date:     02/20/2024  Discharge date: {dischdate:26783}  Discharge Physician: Concepcion Riser   PCP: Rollene Almarie LABOR, MD   Recommendations at discharge:  {Tip this will not be part of the note when signed- Example include specific recommendations for outpatient follow-up, pending tests to follow-up on. (Optional):26781}  ***  Discharge Diagnoses: Active Problems:   Acute pyelonephritis   Mixed hyperlipidemia   Arthritis   Diarrhea of infectious origin   Sepsis (HCC)   Ureteral stone  Resolved Problems:   * No resolved hospital problems. [facility] Course: No notes on file  Assessment and Plan: No notes have been filed under this hospital service. Service: Hospitalist     {Tip this will not be part of the note when signed Body mass index is 24.95 kg/m. , ,  (Optional):26781}  {(NOTE) Pain control PDMP Statment (Optional):26782} Consultants: *** Procedures performed: ***  Disposition: {Plan; Disposition:26390} Diet recommendation:  Discharge Diet Orders (From admission, onward)     Start     Ordered   02/23/24 0000  Diet - low sodium heart healthy        02/23/24 0918           {Diet_Plan:26776} DISCHARGE MEDICATION: Allergies as of 02/23/2024   No Known Allergies      Medication List     STOP taking these medications    cyclobenzaprine  5 MG tablet Commonly known as: FLEXERIL    diclofenac  75 MG EC tablet Commonly known as: VOLTAREN    diphenoxylate -atropine  2.5-0.025 MG tablet Commonly known as: Lomotil        TAKE these medications    cephALEXin  500 MG capsule Commonly known as: KEFLEX  Take 1 capsule (500 mg total) by mouth 3 (three) times daily for 7 days.   finasteride 5 MG tablet Commonly known as: PROSCAR Take 5 mg by mouth every evening.   fluticasone  50 MCG/ACT nasal spray Commonly known as: FLONASE  Place 1 spray into  both nostrils 2 (two) times daily as needed for allergies or rhinitis.   glucosamine-chondroitin 500-400 MG tablet Take 1 tablet by mouth every other day.   meclizine  12.5 MG tablet Commonly known as: ANTIVERT  Take 1 tablet (12.5 mg total) by mouth 3 (three) times daily as needed for dizziness.   multivitamin tablet Take 1 tablet by mouth daily with breakfast.   phosphorus 155-852-130 MG tablet Commonly known as: K PHOS NEUTRAL Take 2 tablets (500 mg total) by mouth daily for 10 days.   pravastatin  20 MG tablet Commonly known as: PRAVACHOL  Take 1 tablet by mouth once daily What changed: when to take this   PriLOSEC  OTC 20 MG tablet Generic drug: omeprazole  Take 20 mg by mouth daily before breakfast.   Systane Ultra PF 0.4-0.3 % Soln Generic drug: Polyethyl Glyc-Propyl Glyc PF Place 1 drop into both eyes 3 (three) times daily as needed (for dryness).   tamsulosin  0.4 MG Caps capsule Commonly known as: FLOMAX  Take 1 capsule by mouth once daily at bedtime   traMADol  50 MG tablet Commonly known as: ULTRAM  Take 1 tablet by mouth once daily What changed: when to take this   Tylenol  325 MG tablet Generic drug: acetaminophen  Take 325-650 mg by mouth every 8 (eight) hours as needed for mild pain (pain score 1-3) or headache.   VITAMIN B PLUS+ PO Take 1 tablet by mouth daily.   Vitamin D3 25 MCG (1000 UT) Caps Take 1,000  Units by mouth daily.        Follow-up Information     Nieves Cough, MD. Call.   Specialty: Urology Why: I was able to remove the stone.  You will need to be seen in about 6 weeks with a left renal ultrasound to make sure the kidney healed up. Contact information: [address] [place] [address] [phone]                Discharge Exam: Fredricka Weights   02/21/24 0013  Weight: 70.1 kg   ***  Condition at discharge: {DC Condition:26389}  The results of significant diagnostics from this hospitalization (including imaging,  microbiology, ancillary and laboratory) are listed below for reference.   Imaging Studies: DG C-Arm 1-60 Min-No Report Result Date: 02/21/2024 Fluoroscopy was utilized by the requesting physician.  No radiographic interpretation.   CT Renal Stone Study Result Date: 02/20/2024 CLINICAL DATA:  Abdominal and flank pain EXAM: CT ABDOMEN AND PELVIS WITHOUT CONTRAST TECHNIQUE: Multidetector CT imaging of the abdomen and pelvis was performed following the standard protocol without IV contrast. RADIATION DOSE REDUCTION: This exam was performed according to the departmental dose-optimization program which includes automated exposure control, adjustment of the mA and/or kV according to patient size and/or use of iterative reconstruction technique. COMPARISON:  10/15/2022 FINDINGS: Lower chest: No acute abnormality. Extensive left anterior descending coronary artery calcification Hepatobiliary: No focal liver abnormality is seen. No gallstones, gallbladder wall thickening, or biliary dilatation. Pancreas: Unremarkable Spleen: Unremarkable Adrenals/Urinary Tract: The adrenal glands are unremarkable. The kidneys are normal in size and position. Stable simple cortical cyst within the upper pole of the right kidney. There is mild left hydronephrosis and hydroureter to the level of the left ureterovesicular junction where an obstructing 2 x 3 x 5 mm calculus is identified. No additional renal or ureteral calculi. No hydronephrosis on the right. The bladder is. Stomach/Bowel: Severe sigmoid diverticulosis. Stomach, small bowel, and large bowel are otherwise unremarkable. Appendix normal. No evidence of obstruction or focal inflammation. No free intraperitoneal gas or fluid. Vascular/Lymphatic: Aortic atherosclerosis. No enlarged abdominal or pelvic lymph nodes. Reproductive: Moderate prostatic hypertrophy Other: No abdominal wall hernia or abnormality. No abdominopelvic ascites. Musculoskeletal: No acute bone abnormality. No  lytic or blastic bone lesion. Osseous structures are age appropriate. Advanced degenerative changes within the lumbar spine result in moderate to severe bilateral neuroforaminal narrowing at L1-2 and L2-3, most severe on the right at L1-2. IMPRESSION: 1. Obstructing 2 x 3 x 5 mm calculus at the left ureterovesicular junction resulting in mild left hydronephrosis and hydroureter. 2. Severe sigmoid diverticulosis. 3. Aortic atherosclerosis. Aortic Atherosclerosis (ICD10-I70.0). Electronically Signed   By: Dorethia Molt M.D.   On: 02/20/2024 21:36   DG Chest Port 1 View Result Date: 02/20/2024 CLINICAL DATA:  Sepsis EXAM: PORTABLE CHEST 1 VIEW COMPARISON:  None Available. FINDINGS: Lungs volumes are small, but are symmetric and are clear. No pneumothorax or pleural effusion. Cardiac size within normal limits. Pulmonary vascularity is normal. Osseous structures are age-appropriate. No acute bone abnormality. IMPRESSION: 1. Pulmonary hypoinflation. Electronically Signed   By: Dorethia Molt M.D.   On: 02/20/2024 19:24    Microbiology: Results for orders placed or performed during the hospital encounter of 02/20/24  Blood Culture (routine x 2)     Status: None (Preliminary result)   Collection Time: 02/20/24  7:10 PM   Specimen: BLOOD  Result Value Ref Range Status   Specimen Description   Final    BLOOD RIGHT ANTECUBITAL  Performed at [facility], [address], [place], [place] [address]    Special Requests   Final    BOTTLES DRAWN AEROBIC AND ANAEROBIC Blood Culture results may not be optimal due to an inadequate volume of blood received in culture bottles Performed at [facility], [address], [place], [place] [address]    Culture   Final    NO GROWTH 2 DAYS Performed at [facility], [address] [place], [place] [address]    Report Status PENDING  Incomplete  Blood Culture (routine x 2)     Status: None (Preliminary result)   Collection  Time: 02/20/24  7:18 PM   Specimen: BLOOD  Result Value Ref Range Status   Specimen Description   Final    BLOOD LEFT ANTECUBITAL Performed at [facility] [address], [place], [place] [address]    Special Requests   Final    BOTTLES DRAWN AEROBIC AND ANAEROBIC Blood Culture results may not be optimal due to an inadequate volume of blood received in culture bottles Performed at [facility], [address], [place], [place] [address]    Culture   Final    NO GROWTH 2 DAYS Performed at [facility], [address] [place], [place] [address]    Report Status PENDING  Incomplete  Urine Culture     Status: Abnormal (Preliminary result)   Collection Time: 02/20/24  8:10 PM   Specimen: Urine, Random  Result Value Ref Range Status   Specimen Description   Final    URINE, RANDOM Performed at [facility], [address] [place], [place] [address]    Special Requests   Final    NONE Reflexed from [id number] Performed at [facility], [address], [place], [place] [address]    Culture (A)  Final    >=100,000 COLONIES/mL GRAM NEGATIVE RODS IDENTIFICATION AND SUSCEPTIBILITIES TO FOLLOW Performed at [facility], [address] [place], [place] [address]    Report Status PENDING  Incomplete    Labs: CBC: Recent Labs  Lab 02/20/24 1914 02/21/24 0411 02/22/24 0348 02/23/24 0407  WBC 18.3* 20.4* 11.1* 10.2  NEUTROABS 15.9* 16.8*  --   --   HGB 14.8 14.1 12.8* 13.2  HCT 43.0 42.2 39.7 40.2  MCV 97.1 100.2* 102.8* 100.5*  PLT 259 230 195 211   Basic Metabolic Panel: Recent Labs  Lab 02/20/24 1914 02/21/24 0410 02/22/24 0348 02/23/24 0407  NA 136 138 135 136  K 4.1 3.4* 3.5 3.3*  CL 102 105 104 102  CO2 22 21* 24 23  GLUCOSE 111* 116* 172* 131*  BUN 27* 20 16 17   CREATININE 0.81 0.64 0.83 0.76  CALCIUM 9.3 9.0 8.2* 8.4*  MG  --  1.9  --   --   PHOS  --  2.3* 2.3* 1.8*    Liver Function Tests: Recent Labs  Lab 02/20/24 1914 02/21/24 0410  AST 43* 32  ALT 39 37  ALKPHOS 57 55  BILITOT 1.0 1.3*  PROT 7.2 6.4*  ALBUMIN 4.2 3.5   CBG: No results for input(s): GLUCAP in the last 168 hours.  Discharge time spent: {LESS THAN/GREATER UYJW:73611} 30 minutes.  Signed: Concepcion Riser, MD [facility] 02/23/2024

## 2024-02-23 NOTE — Care Management Important Message (Signed)
 Important Message  Patient Details IM Letter given. Name: Andrew Foster MRN: [id number] Date of Birth: 16-Aug-1941   Important Message Given:  Yes - Medicare IM     [name] 02/23/2024, 9:54 AM

## 2024-02-24 ENCOUNTER — Telehealth: Payer: Self-pay | Admitting: *Deleted

## 2024-02-24 LAB — URINE CULTURE

## 2024-02-24 NOTE — Discharge Summary (Signed)
 Physician Discharge Summary   Patient: Andrew Foster MRN: [id number] DOB: 12/20/1940  Admit date:     02/20/2024  Discharge date: 02/23/2024  Discharge Physician: Concepcion Riser   PCP: Rollene Almarie LABOR, MD   Recommendations at discharge:    PCP follow up in 1 week. Urology follow up as scheduled.  Discharge Diagnoses: Active Problems:   Acute pyelonephritis   Mixed hyperlipidemia   Arthritis   Diarrhea of infectious origin   Sepsis (HCC)   Ureteral stone  Resolved Problems:   * No resolved hospital problems. *  Hospital Course: NIZAR CUTLER is a 83 y.o. male with medical history significant of BPH, kidney stones, chronic diarrhea, knee arthritis presented with  left flank pain.  Patient had fever of 100.8, urinary frequency and chills.  CT abdomen pelvis showed obstructing left ureterovesicular junction stone resulting in hydroureteronephrosis.  Urology consulted admitted to [facility] service for further management.  Patient had left ureteroscopy with stone basket extraction and stent placement by urology 02/21/2024.   Assessment and Plan: Sepsis due to acute pyelonephritis Left ureteral stone. He presented with fever, white count, border line low BP.  CT stone study showing obstructing left ureterovesicular junction resulting in left hydroureteronephrosis. Urology evaluation and follow-up appreciated.  Patient got left ureteroscopy with stone basket extraction and stent placement. Rocephin  therapy changed to Keflex  for 7 days. Advised to follow PCP and urology as instructed.   BPH: Continue flomax , Proscar.   Arthritis Chronic stable pain controlled.   Hyperlipidemia- Continue statin.        Consultants: Urology Procedures performed: Left ureteral stent  Disposition: Home Diet recommendation:  Discharge Diet Orders (From admission, onward)     Start     Ordered   02/23/24 0000  Diet - low sodium heart healthy        02/23/24 0918            Cardiac diet DISCHARGE MEDICATION: Allergies as of 02/23/2024   No Known Allergies      Medication List     STOP taking these medications    cyclobenzaprine  5 MG tablet Commonly known as: FLEXERIL    diclofenac  75 MG EC tablet Commonly known as: VOLTAREN    diphenoxylate -atropine  2.5-0.025 MG tablet Commonly known as: Lomotil        TAKE these medications    cephALEXin  500 MG capsule Commonly known as: KEFLEX  Take 1 capsule (500 mg total) by mouth 3 (three) times daily for 7 days.   finasteride 5 MG tablet Commonly known as: PROSCAR Take 5 mg by mouth every evening.   fluticasone  50 MCG/ACT nasal spray Commonly known as: FLONASE  Place 1 spray into both nostrils 2 (two) times daily as needed for allergies or rhinitis.   glucosamine-chondroitin 500-400 MG tablet Take 1 tablet by mouth every other day.   meclizine  12.5 MG tablet Commonly known as: ANTIVERT  Take 1 tablet (12.5 mg total) by mouth 3 (three) times daily as needed for dizziness.   multivitamin tablet Take 1 tablet by mouth daily with breakfast.   Phospha 250 Neutral 155-852-130 MG Tabs Take 2 tablets (500 mg total) by mouth daily for 10 days.   pravastatin  20 MG tablet Commonly known as: PRAVACHOL  Take 1 tablet by mouth once daily What changed: when to take this   PriLOSEC  OTC 20 MG tablet Generic drug: omeprazole  Take 20 mg by mouth daily before breakfast.   Systane Ultra PF 0.4-0.3 % Soln Generic drug: Polyethyl Glyc-Propyl Glyc PF Place 1 drop into both eyes  3 (three) times daily as needed (for dryness).   tamsulosin  0.4 MG Caps capsule Commonly known as: FLOMAX  Take 1 capsule by mouth once daily at bedtime   traMADol  50 MG tablet Commonly known as: ULTRAM  Take 1 tablet by mouth once daily What changed: when to take this   Tylenol  325 MG tablet Generic drug: acetaminophen  Take 325-650 mg by mouth every 8 (eight) hours as needed for mild pain (pain score 1-3) or headache.    VITAMIN B PLUS+ PO Take 1 tablet by mouth daily.   Vitamin D3 25 MCG (1000 UT) Caps Take 1,000 Units by mouth daily.        Follow-up Information     Nieves Cough, MD. Call.   Specialty: Urology Why: I was able to remove the stone.  You will need to be seen in about 6 weeks with a left renal ultrasound to make sure the kidney healed up. Contact information: [address] [place] [address] [phone]                Discharge Exam: Filed Weights   02/21/24 0013  Weight: 70.1 kg      02/23/2024    3:13 AM 02/22/2024    7:33 PM 02/22/2024   12:46 PM  Vitals with BMI  Systolic 111 117 890  Diastolic 72 64 68  Pulse 76 71 68    General - Elderly Caucasian male, no apparent distress HEENT - PERRLA, EOMI, atraumatic head, non tender sinuses. Lung - Clear, basal rhonchi, no wheezes. Heart - S1, S2 heard, no murmurs, rubs, trace pedal edema. Abdomen - Soft, non tender, bowel sounds good Neuro - Alert, awake and oriented x 3, non focal exam. Skin - Warm and dry.    Condition at discharge: stable  The results of significant diagnostics from this hospitalization (including imaging, microbiology, ancillary and laboratory) are listed below for reference.   Imaging Studies: DG C-Arm 1-60 Min-No Report Result Date: 02/21/2024 Fluoroscopy was utilized by the requesting physician.  No radiographic interpretation.   CT Renal Stone Study Result Date: 02/20/2024 CLINICAL DATA:  Abdominal and flank pain EXAM: CT ABDOMEN AND PELVIS WITHOUT CONTRAST TECHNIQUE: Multidetector CT imaging of the abdomen and pelvis was performed following the standard protocol without IV contrast. RADIATION DOSE REDUCTION: This exam was performed according to the departmental dose-optimization program which includes automated exposure control, adjustment of the mA and/or kV according to patient size and/or use of iterative reconstruction technique. COMPARISON:  10/15/2022 FINDINGS: Lower  chest: No acute abnormality. Extensive left anterior descending coronary artery calcification Hepatobiliary: No focal liver abnormality is seen. No gallstones, gallbladder wall thickening, or biliary dilatation. Pancreas: Unremarkable Spleen: Unremarkable Adrenals/Urinary Tract: The adrenal glands are unremarkable. The kidneys are normal in size and position. Stable simple cortical cyst within the upper pole of the right kidney. There is mild left hydronephrosis and hydroureter to the level of the left ureterovesicular junction where an obstructing 2 x 3 x 5 mm calculus is identified. No additional renal or ureteral calculi. No hydronephrosis on the right. The bladder is. Stomach/Bowel: Severe sigmoid diverticulosis. Stomach, small bowel, and large bowel are otherwise unremarkable. Appendix normal. No evidence of obstruction or focal inflammation. No free intraperitoneal gas or fluid. Vascular/Lymphatic: Aortic atherosclerosis. No enlarged abdominal or pelvic lymph nodes. Reproductive: Moderate prostatic hypertrophy Other: No abdominal wall hernia or abnormality. No abdominopelvic ascites. Musculoskeletal: No acute bone abnormality. No lytic or blastic bone lesion. Osseous structures are age appropriate. Advanced degenerative changes within  the lumbar spine result in moderate to severe bilateral neuroforaminal narrowing at L1-2 and L2-3, most severe on the right at L1-2. IMPRESSION: 1. Obstructing 2 x 3 x 5 mm calculus at the left ureterovesicular junction resulting in mild left hydronephrosis and hydroureter. 2. Severe sigmoid diverticulosis. 3. Aortic atherosclerosis. Aortic Atherosclerosis (ICD10-I70.0). Electronically Signed   By: Dorethia Molt M.D.   On: 02/20/2024 21:36   DG Chest Port 1 View Result Date: 02/20/2024 CLINICAL DATA:  Sepsis EXAM: PORTABLE CHEST 1 VIEW COMPARISON:  None Available. FINDINGS: Lungs volumes are small, but are symmetric and are clear. No pneumothorax or pleural effusion. Cardiac  size within normal limits. Pulmonary vascularity is normal. Osseous structures are age-appropriate. No acute bone abnormality. IMPRESSION: 1. Pulmonary hypoinflation. Electronically Signed   By: Dorethia Molt M.D.   On: 02/20/2024 19:24    Microbiology: Results for orders placed or performed during the hospital encounter of 02/20/24  Blood Culture (routine x 2)     Status: None (Preliminary result)   Collection Time: 02/20/24  7:10 PM   Specimen: BLOOD  Result Value Ref Range Status   Specimen Description   Final    BLOOD RIGHT ANTECUBITAL Performed at [facility], [address], [place], [place] [address]    Special Requests   Final    BOTTLES DRAWN AEROBIC AND ANAEROBIC Blood Culture results may not be optimal due to an inadequate volume of blood received in culture bottles Performed at [facility], [address], [place], [place] [address]    Culture   Final    NO GROWTH 2 DAYS Performed at [facility], [address] [place], [place] [address]    Report Status PENDING  Incomplete  Blood Culture (routine x 2)     Status: None (Preliminary result)   Collection Time: 02/20/24  7:18 PM   Specimen: BLOOD  Result Value Ref Range Status   Specimen Description   Final    BLOOD LEFT ANTECUBITAL Performed at [facility], [address], [place], [place] [address]    Special Requests   Final    BOTTLES DRAWN AEROBIC AND ANAEROBIC Blood Culture results may not be optimal due to an inadequate volume of blood received in culture bottles Performed at [facility], [address], [place], [place] [address]    Culture   Final    NO GROWTH 2 DAYS Performed at [facility], [address], [place], [place] [address]    Report Status PENDING  Incomplete  Urine Culture     Status: Abnormal   Collection Time: 02/20/24  8:10 PM   Specimen: Urine, Random  Result Value Ref Range Status   Specimen  Description   Final    URINE, RANDOM Performed at [facility], [address] [place], [place] [address]    Special Requests   Final    NONE Reflexed from [id number] Performed at [facility], [address], [place], [place] [address]    Culture >=100,000 COLONIES/mL ESCHERICHIA COLI (A)  Final   Report Status 02/24/2024 FINAL  Final   Organism ID, Bacteria ESCHERICHIA COLI (A)  Final      Susceptibility   Escherichia coli - MIC*    AMPICILLIN 4 SENSITIVE Sensitive     CEFAZOLIN <=4 SENSITIVE Sensitive     CEFEPIME <=0.12 SENSITIVE Sensitive     CEFTRIAXONE  <=0.25 SENSITIVE Sensitive     CIPROFLOXACIN  <=0.25 SENSITIVE  Sensitive     GENTAMICIN <=1 SENSITIVE Sensitive     IMIPENEM <=0.25 SENSITIVE Sensitive     NITROFURANTOIN <=16 SENSITIVE Sensitive     TRIMETH/SULFA <=20 SENSITIVE Sensitive     AMPICILLIN/SULBACTAM <=2 SENSITIVE Sensitive     PIP/TAZO <=4 SENSITIVE Sensitive ug/mL    * >=100,000 COLONIES/mL ESCHERICHIA COLI    Labs: CBC: Recent Labs  Lab 02/20/24 1914 02/21/24 0411 02/22/24 0348 02/23/24 0407  WBC 18.3* 20.4* 11.1* 10.2  NEUTROABS 15.9* 16.8*  --   --   HGB 14.8 14.1 12.8* 13.2  HCT 43.0 42.2 39.7 40.2  MCV 97.1 100.2* 102.8* 100.5*  PLT 259 230 195 211   Basic Metabolic Panel: Recent Labs  Lab 02/20/24 1914 02/21/24 0410 02/22/24 0348 02/23/24 0407  NA 136 138 135 136  K 4.1 3.4* 3.5 3.3*  CL 102 105 104 102  CO2 22 21* 24 23  GLUCOSE 111* 116* 172* 131*  BUN 27* 20 16 17   CREATININE 0.81 0.64 0.83 0.76  CALCIUM 9.3 9.0 8.2* 8.4*  MG  --  1.9  --   --   PHOS  --  2.3* 2.3* 1.8*   Liver Function Tests: Recent Labs  Lab 02/20/24 1914 02/21/24 0410  AST 43* 32  ALT 39 37  ALKPHOS 57 55  BILITOT 1.0 1.3*  PROT 7.2 6.4*  ALBUMIN 4.2 3.5   CBG: No results for input(s): GLUCAP in the last 168 hours.  Discharge time spent: 34 minutes.  Signed: Concepcion Riser, MD [facility] 02/24/2024

## 2024-02-24 NOTE — Transitions of Care (Post Inpatient/ED Visit) (Signed)
 02/24/2024  Name: Andrew Foster MRN: [id number] DOB: [birth year]  Today's TOC FU Call Status: Today's TOC FU Call Status:: Successful TOC FU Call Completed TOC FU Call Complete Date: 02/24/24 Patient's Name and Date of Birth confirmed.  Transition Care Management Follow-up Telephone Call Date of Discharge: 02/23/24 Discharge Facility: [facility]) Type of Discharge: Inpatient Admission Primary Inpatient Discharge Diagnosis:: SIRS- pyelonephritis How have you been since you were released from the hospital?: Better (I am doing fine; pulled the stent out of my bladder this morning, that went fine.  Not having any problems at all.  Kidney stones are no fun but I am better now.  I have plently of help here at home and my son is a Teacher, early years/pre at [employer]) Any questions or concerns?: No  Items Reviewed: Did you receive and understand the discharge instructions provided?: Yes (thoroughly reviewed with patient who verbalizes very good understanding of same) Medications obtained,verified, and reconciled?: Yes (Medications Reviewed) (Full medication reconciliation/ review completed; no concerns or discrepancies identified; confirmed patient obtained/ is taking all newly Rx'd medications as instructed; self-manages medications and denies questions/ concerns around medications today) Any new allergies since your discharge?: No Dietary orders reviewed?: Yes Type of Diet Ordered:: Healthy as possible, low salt Do you have support at home?: Yes People in Home [RPT]: alone Name of Support/Comfort Primary Source: Reports independent in self-care activities; supportive local family assists as/ if needed/ indicated:  reports my son lives right behind me  Medications Reviewed Today: Medications Reviewed Today     Reviewed by Lycia Sachdeva M, RN (Registered Nurse) on 02/24/24 at 1523  Med List Status: <None>   Medication Order Taking? Sig Documenting Provider Last Dose Status Informant  cephALEXin   (KEFLEX ) 500 MG capsule [id number] Yes Take 1 capsule (500 mg total) by mouth 3 (three) times daily for 7 days. Darci Pore, MD  Active   Cholecalciferol (VITAMIN D3) 25 MCG (1000 UT) CAPS [id number] Yes Take 1,000 Units by mouth daily. [provider]  Active Self  finasteride (PROSCAR) 5 MG tablet [id number] Yes Take 5 mg by mouth every evening. [provider]  Active Self  fluticasone  (FLONASE ) 50 MCG/ACT nasal spray [id number] Yes Place 1 spray into both nostrils 2 (two) times daily as needed for allergies or rhinitis. [provider]  Active Self  glucosamine-chondroitin 500-400 MG tablet [id number] Yes Take 1 tablet by mouth every other day. [provider]  Active Self  meclizine  (ANTIVERT ) 12.5 MG tablet [id number] Yes Take 1 tablet (12.5 mg total) by mouth 3 (three) times daily as needed for dizziness. Rollene Almarie LABOR, MD  Active Self  Multiple Vitamin (MULTIVITAMIN) tablet [id number] Yes Take 1 tablet by mouth daily with breakfast. [provider]  Active Self  phosphorus (K PHOS NEUTRAL) 155-852-130 MG tablet [id number] Yes Take 2 tablets (500 mg total) by mouth daily for 10 days. Darci Pore, MD  Active   pravastatin  (PRAVACHOL ) 20 MG tablet [id number] Yes Take 1 tablet by mouth once daily Rollene Almarie LABOR, MD  Active Self  PRILOSEC  OTC 20 MG tablet [id number] Yes Take 20 mg by mouth daily before breakfast. [provider]  Active Self  SYSTANE ULTRA PF 0.4-0.3 % SOLN [id number] Yes Place 1 drop into both eyes 3 (three) times daily as needed (for dryness). [provider]  Active Self  tamsulosin  (FLOMAX ) 0.4 MG CAPS capsule [id number] Yes Take 1 capsule by mouth once daily at bedtime Rollene Almarie LABOR, MD  Active Self  traMADol  (  ULTRAM ) 50 MG tablet [id number] Yes Take 1 tablet by mouth once daily Rollene Almarie LABOR, MD  Active Self  TYLENOL  325 MG tablet [id number] Yes Take 325-650 mg by mouth every 8  (eight) hours as needed for mild pain (pain score 1-3) or headache. [provider]  Active Self  Vit B6-Vit B12-Omega 3 Acids (VITAMIN B PLUS+ PO) [id number] Yes Take 1 tablet by mouth daily. [provider]  Active Self           Home Care and Equipment/Supplies: Were Home Health Services Ordered?: No Any new equipment or medical supplies ordered?: No  Functional Questionnaire: Do you need assistance with bathing/showering or dressing?: No Do you need assistance with meal preparation?: No Do you need assistance with eating?: No Do you have difficulty maintaining continence: No Do you need assistance with getting out of bed/getting out of a chair/moving?: No Do you have difficulty managing or taking your medications?: No  Follow up appointments reviewed: PCP Follow-up appointment confirmed?: Yes (care coordination outreach in real-time with scheduling care guide to successfully schedule hospital follow up PCP appointment 03/03/24) Date of PCP follow-up appointment?: 03/03/24 Follow-up Provider: PCP- covering provider/ Corean Ku NP Specialist Hospital Follow-up appointment confirmed?: Yes Date of Specialist follow-up appointment?:  (I can't remember the exact date, but it is already scheduled- they called me today) Follow-Up Specialty Provider:: Urology provider Do you need transportation to your follow-up appointment?: No Do you understand care options if your condition(s) worsen?: Yes-patient verbalized understanding  SDOH Interventions Today    Flowsheet Row Most Recent Value  SDOH Interventions   Food Insecurity Interventions Intervention Not Indicated  Housing Interventions Intervention Not Indicated  Transportation Interventions Intervention Not Indicated  [drives self]  Utilities Interventions Intervention Not Indicated   Patient declines need for ongoing/ further care management/ coordination outreach; declines enrollment in 30-day TOC program-  declines taking my direct phone number should needs/ concerns arise post-TOC call   See TOC assessment tabs for additional assessment/ TOC intervention information  Pls call/ message for questions,  Rheta Hemmelgarn Mckinney Deane Melick, RN, BSN, Media planner  Transitions of Care  [facility] Population [facility]  [place] [phone]: direct office

## 2024-02-25 ENCOUNTER — Other Ambulatory Visit: Payer: Self-pay | Admitting: Internal Medicine

## 2024-02-25 ENCOUNTER — Inpatient Hospital Stay: Admitting: Family Medicine

## 2024-02-26 LAB — CULTURE, BLOOD (ROUTINE X 2)
Culture: NO GROWTH
Culture: NO GROWTH

## 2024-03-03 ENCOUNTER — Other Ambulatory Visit (INDEPENDENT_AMBULATORY_CARE_PROVIDER_SITE_OTHER)

## 2024-03-03 ENCOUNTER — Ambulatory Visit: Payer: Self-pay | Admitting: Emergency Medicine

## 2024-03-03 ENCOUNTER — Encounter: Payer: Self-pay | Admitting: Internal Medicine

## 2024-03-03 ENCOUNTER — Ambulatory Visit: Admitting: Internal Medicine

## 2024-03-03 VITALS — BP 120/82 | HR 72 | Temp 98.6°F | Ht 66.0 in | Wt 170.0 lb

## 2024-03-03 DIAGNOSIS — N10 Acute tubulo-interstitial nephritis: Secondary | ICD-10-CM

## 2024-03-03 DIAGNOSIS — Z87440 Personal history of urinary (tract) infections: Secondary | ICD-10-CM

## 2024-03-03 DIAGNOSIS — K909 Intestinal malabsorption, unspecified: Secondary | ICD-10-CM | POA: Diagnosis not present

## 2024-03-03 DIAGNOSIS — N3281 Overactive bladder: Secondary | ICD-10-CM | POA: Diagnosis not present

## 2024-03-03 DIAGNOSIS — R197 Diarrhea, unspecified: Secondary | ICD-10-CM

## 2024-03-03 DIAGNOSIS — N201 Calculus of ureter: Secondary | ICD-10-CM

## 2024-03-03 LAB — URINALYSIS, ROUTINE W REFLEX MICROSCOPIC
Bilirubin Urine: NEGATIVE
Hgb urine dipstick: NEGATIVE
Ketones, ur: NEGATIVE
Leukocytes,Ua: NEGATIVE
Nitrite: NEGATIVE
Specific Gravity, Urine: 1.025 (ref 1.000–1.030)
Total Protein, Urine: NEGATIVE
Urine Glucose: NEGATIVE
Urobilinogen, UA: 0.2 (ref 0.0–1.0)
pH: 6 (ref 5.0–8.0)

## 2024-03-03 LAB — COMPREHENSIVE METABOLIC PANEL WITH GFR
ALT: 23 U/L (ref 0–53)
AST: 19 U/L (ref 0–37)
Albumin: 4.3 g/dL (ref 3.5–5.2)
Alkaline Phosphatase: 101 U/L (ref 39–117)
BUN: 20 mg/dL (ref 6–23)
CO2: 30 meq/L (ref 19–32)
Calcium: 9.6 mg/dL (ref 8.4–10.5)
Chloride: 103 meq/L (ref 96–112)
Creatinine, Ser: 0.72 mg/dL (ref 0.40–1.50)
GFR: 84.29 mL/min (ref >60.00)
Glucose, Bld: 95 mg/dL (ref 70–99)
Potassium: 4.4 meq/L (ref 3.5–5.1)
Sodium: 139 meq/L (ref 135–145)
Total Bilirubin: 0.5 mg/dL (ref 0.2–1.2)
Total Protein: 7 g/dL (ref 6.0–8.3)

## 2024-03-03 LAB — CBC WITH DIFFERENTIAL/PLATELET
Basophils Absolute: 0.1 K/uL (ref 0.0–0.1)
Basophils Relative: 0.7 % (ref 0.0–3.0)
Eosinophils Absolute: 0.2 K/uL (ref 0.0–0.7)
Eosinophils Relative: 1.9 % (ref 0.0–5.0)
HCT: 40.5 % (ref 39.0–52.0)
Hemoglobin: 13.7 g/dL (ref 13.0–17.0)
Lymphocytes Relative: 24.5 % (ref 12.0–46.0)
Lymphs Abs: 2.3 K/uL (ref 0.7–4.0)
MCHC: 33.8 g/dL (ref 30.0–36.0)
MCV: 96.7 fl (ref 78.0–100.0)
Monocytes Absolute: 0.7 K/uL (ref 0.1–1.0)
Monocytes Relative: 7.9 % (ref 3.0–12.0)
Neutro Abs: 6.1 K/uL (ref 1.4–7.7)
Neutrophils Relative %: 65 % (ref 43.0–77.0)
Platelets: 400 K/uL (ref 150.0–400.0)
RBC: 4.19 Mil/uL — ABNORMAL LOW (ref 4.22–5.81)
RDW: 12.7 % (ref 11.5–15.5)
WBC: 9.4 K/uL (ref 4.0–10.5)

## 2024-03-03 LAB — BASIC METABOLIC PANEL WITH GFR
BUN: 20 mg/dL (ref 6–23)
CO2: 30 meq/L (ref 19–32)
Calcium: 9.6 mg/dL (ref 8.4–10.5)
Chloride: 103 meq/L (ref 96–112)
Creatinine, Ser: 0.72 mg/dL (ref 0.40–1.50)
GFR: 84.29 mL/min (ref >60.00)
Glucose, Bld: 95 mg/dL (ref 70–99)
Potassium: 4.4 meq/L (ref 3.5–5.1)
Sodium: 139 meq/L (ref 135–145)

## 2024-03-03 LAB — LIPASE: Lipase: 31 U/L (ref 11.0–59.0)

## 2024-03-03 LAB — TSH: TSH: 1.51 u[IU]/mL (ref 0.35–5.50)

## 2024-03-03 LAB — VITAMIN B12: Vitamin B-12: 824 pg/mL (ref 211–911)

## 2024-03-03 MED ORDER — SOLIFENACIN SUCCINATE 5 MG PO TABS
5.0000 mg | ORAL_TABLET | Freq: Every day | ORAL | 3 refills | Status: AC
Start: 2024-03-03 — End: ?

## 2024-03-03 NOTE — Progress Notes (Signed)
 Patient ID: Andrew Foster, male   DOB: 03-03-1941, 83 y.o.   MRN: [id number]        Chief Complaint: follow up recent hospn June 21 - 24 with acute pyelonephritis       HPI:  Andrew Foster is a 83 y.o. male here with above, with left ureteral renal stone removed per urology by ureteroscopy, now finished 7 days cephalxin and doing well except for persistent urinary frequency    Denies urinary symptoms such as dysuria, urgency, flank pain, hematuria or n/v, fever, chills.  This had been going on prior to recent hospn as well. Most recently has to go to [facility] every 2 hrs, and up several times at night.  Has f/u appt with urology at 6 wks post d/c.  Pt denies chest pain, increased sob or doe, wheezing, orthopnea, PND, increased LE swelling, palpitations, dizziness or syncope.   Pt denies polydipsia, polyuria, or new focal neuro s/s.    Pt denies fever, wt loss, night sweats, loss of appetite, or other constitutional symptoms        Wt Readings from Last 3 Encounters:  03/03/24 170 lb (77.1 kg)  02/21/24 154 lb 9.6 oz (70.1 kg)  02/18/24 172 lb (78 kg)   BP Readings from Last 3 Encounters:  03/03/24 120/82  02/23/24 111/72  02/18/24 118/68         Past Medical History:  Diagnosis Date   Acute pyelonephritis 11/01/2016   Adenomatous polyps of colon 01/2017   no f/u - age   BPH (benign prostatic hyperplasia)    Diverticulosis    Hypertension    Past Surgical History:  Procedure Laterality Date   CARPAL TUNNEL RELEASE     COLONOSCOPY     Tics; [facility]   CYSTOSCOPY WITH RETROGRADE PYELOGRAM, URETEROSCOPY AND STENT PLACEMENT Left 02/21/2024   Procedure: CYSTOURETEROSCOPY, WITH RETROGRADE PYELOGRAM AND STENT INSERTION;  Surgeon: Nieves Cough, MD;  Location: [facility];  Service: Urology;  Laterality: Left;   CYSTOSCOPY/RETROGRADE/URETEROSCOPY/STONE EXTRACTION WITH BASKET Left 02/21/2024   Procedure: CYSTOSCOPY, WITH CALCULUS REMOVAL USING BASKET;  Surgeon: Nieves Cough, MD;  Location: [facility]  [facility];  Service: Urology;  Laterality: Left;   HERNIA REPAIR     intra articular steroids     R shoulder   PROSTATE BIOPSY     Dr Aleene   VASECTOMY      reports that he quit smoking about 49 years ago. His smoking use included cigarettes. He has never used smokeless tobacco. He reports that he does not drink alcohol and does not use drugs. family history includes Diabetes in his brother, mother, and sister; Diabetes type I in his brother; Heart attack (age of onset: 72) in his mother. No Known Allergies Current Outpatient Medications on File Prior to Visit  Medication Sig Dispense Refill   Cholecalciferol (VITAMIN D3) 25 MCG (1000 UT) CAPS Take 1,000 Units by mouth daily.     finasteride  (PROSCAR ) 5 MG tablet Take 5 mg by mouth every evening.     fluticasone  (FLONASE ) 50 MCG/ACT nasal spray Place 1 spray into both nostrils 2 (two) times daily as needed for allergies or rhinitis.     glucosamine-chondroitin 500-400 MG tablet Take 1 tablet by mouth every other day.     meclizine  (ANTIVERT ) 12.5 MG tablet Take 1 tablet (12.5 mg total) by mouth 3 (three) times daily as needed for dizziness. 30 tablet 1   Multiple Vitamin (MULTIVITAMIN) tablet Take 1 tablet by mouth daily with breakfast.  phosphorus (K PHOS  NEUTRAL) 155-852-130 MG tablet Take 2 tablets (500 mg total) by mouth daily for 10 days. 20 tablet 0   pravastatin  (PRAVACHOL ) 20 MG tablet Take 1 tablet by mouth once daily 90 tablet 0   PRILOSEC  OTC 20 MG tablet Take 20 mg by mouth daily before breakfast.     SYSTANE ULTRA PF 0.4-0.3 % SOLN Place 1 drop into both eyes 3 (three) times daily as needed (for dryness).     tamsulosin  (FLOMAX ) 0.4 MG CAPS capsule Take 1 capsule by mouth once daily at bedtime 90 capsule 0   traMADol  (ULTRAM ) 50 MG tablet Take 1 tablet by mouth once daily 30 tablet 0   TYLENOL  325 MG tablet Take 325-650 mg by mouth every 8 (eight) hours as needed for mild pain (pain score 1-3) or headache.     Vit B6-Vit B12-Omega  3 Acids (VITAMIN B PLUS+ PO) Take 1 tablet by mouth daily.     No current facility-administered medications on file prior to visit.        ROS:  All others reviewed and negative.  Objective        PE:  BP 120/82 (BP Location: Right Arm, Patient Position: Sitting, Cuff Size: Normal)   Pulse 72   Temp 98.6 F (37 C) (Oral)   Ht 5' 6 (1.676 m)   Wt 170 lb (77.1 kg)   SpO2 98%   BMI 27.44 kg/m                 Constitutional: Pt appears in NAD               HENT: Head: NCAT.                Right Ear: External ear normal.                 Left Ear: External ear normal.                Eyes: . Pupils are equal, round, and reactive to light. Conjunctivae and EOM are normal               Nose: without d/c or deformity               Neck: Neck supple. Gross normal ROM               Cardiovascular: Normal rate and regular rhythm.                 Pulmonary/Chest: Effort normal and breath sounds without rales or wheezing.                Abd:  Soft, NT, ND, + BS, no organomegaly               Neurological: Pt is alert. At baseline orientation, motor grossly intact               Skin: Skin is warm. No rashes, no other new lesions, LE edema - none               Psychiatric: Pt behavior is normal without agitation   Micro: none  Cardiac tracings I have personally interpreted today:  none  Pertinent Radiological findings (summarize): none   Lab Results  Component Value Date   WBC 10.2 02/23/2024   HGB 13.2 02/23/2024   HCT 40.2 02/23/2024   PLT 211 02/23/2024   GLUCOSE 131 (H) 02/23/2024   CHOL 166 07/30/2022   TRIG  286.0 (H) 07/30/2022   HDL 38.80 (L) 07/30/2022   LDLDIRECT 105.0 07/30/2022   LDLCALC 112 (H) 06/18/2018   ALT 37 02/21/2024   AST 32 02/21/2024   NA 136 02/23/2024   K 3.3 (L) 02/23/2024   CL 102 02/23/2024   CREATININE 0.76 02/23/2024   BUN 17 02/23/2024   CO2 23 02/23/2024   TSH 2.28 02/01/2015   PSA 4.91 (H) 07/30/2022   INR 1.0 02/20/2024   HGBA1C 6.4  07/30/2022   MICROALBUR 0.9 02/08/2007   Assessment/Plan:  Andrew Foster is a 83 y.o. White or Caucasian [1] male with  has a past medical history of Acute pyelonephritis (11/01/2016), Adenomatous polyps of colon (01/2017), BPH (benign prostatic hyperplasia), Diverticulosis, and Hypertension.  Acute pyelonephritis Recent onset s/p left renal stone removal; doing well now with s/s clinically resolved  OAB (overactive bladder) Pt still with urinary frequency  - for ua and cx, but also trial vesicare 5 mg qd  Ureteral stone Pt to f/u with urology at 6 wks as planned  Followup: Return if symptoms worsen or fail to improve.  Andrew Rush, MD 03/03/2024 1:24 PM [facility] [facility]

## 2024-03-03 NOTE — Assessment & Plan Note (Signed)
 Pt still with urinary frequency  - for ua and cx, but also trial vesicare 5 mg qd

## 2024-03-03 NOTE — Assessment & Plan Note (Signed)
 Pt to f/u with urology at 6 wks as planned

## 2024-03-03 NOTE — Assessment & Plan Note (Signed)
 Recent onset s/p left renal stone removal; doing well now with s/s clinically resolved

## 2024-03-03 NOTE — Patient Instructions (Signed)
 Please take all new medication as prescribed - the vesicare 5 mg per day  Please continue all other medications as before, and refills have been done if requested.  Please have the pharmacy call with any other refills you may need.  Please keep your appointments with your specialists as you may have planned- urology at 6 wks  Please go to the LAB at the blood drawing area for the tests to be done - at the [address] lab in the basement  You will be contacted by phone if any changes need to be made immediately.  Otherwise, you will receive a letter about your results with an explanation, but please check with MyChart first.

## 2024-03-04 ENCOUNTER — Ambulatory Visit: Payer: Self-pay | Admitting: Internal Medicine

## 2024-03-04 LAB — URINE CULTURE
MICRO NUMBER:: 16657494
Result:: NO GROWTH
SPECIMEN QUALITY:: ADEQUATE

## 2024-03-08 DIAGNOSIS — M1712 Unilateral primary osteoarthritis, left knee: Secondary | ICD-10-CM | POA: Diagnosis not present

## 2024-03-21 ENCOUNTER — Telehealth: Payer: Self-pay | Admitting: Internal Medicine

## 2024-03-21 NOTE — Telephone Encounter (Signed)
 [facility] faxed over document Surgical Clearance, to be filled out by provider. Patient requested to send it back via Fax within 7-days. Document is located in providers tray at front office.Please advise at Mobile [phone] (mobile)

## 2024-03-23 NOTE — Telephone Encounter (Signed)
 Received and placed inside providers box

## 2024-03-25 ENCOUNTER — Other Ambulatory Visit: Payer: Self-pay | Admitting: Internal Medicine

## 2024-03-29 NOTE — Telephone Encounter (Unsigned)
 Copied from CRM [id number]. Topic: General - Inquiry >> Mar 29, 2024 11:40 AM Laymon HERO wrote: Reason for CRM: Lloyd Hamper- Glib#[phone]- needs to have clearance and office notes sent back as soon as possible- surgeon is retiring at end of August.

## 2024-03-29 NOTE — Telephone Encounter (Signed)
 I fax this over today

## 2024-03-30 ENCOUNTER — Other Ambulatory Visit: Payer: Self-pay | Admitting: Internal Medicine

## 2024-03-31 ENCOUNTER — Other Ambulatory Visit: Payer: Self-pay | Admitting: Internal Medicine

## 2024-04-07 ENCOUNTER — Other Ambulatory Visit: Payer: Self-pay | Admitting: Orthopedic Surgery

## 2024-04-07 DIAGNOSIS — N401 Enlarged prostate with lower urinary tract symptoms: Secondary | ICD-10-CM | POA: Diagnosis not present

## 2024-04-07 DIAGNOSIS — N201 Calculus of ureter: Secondary | ICD-10-CM | POA: Diagnosis not present

## 2024-04-07 DIAGNOSIS — R350 Frequency of micturition: Secondary | ICD-10-CM | POA: Diagnosis not present

## 2024-04-07 DIAGNOSIS — R972 Elevated prostate specific antigen [PSA]: Secondary | ICD-10-CM | POA: Diagnosis not present

## 2024-04-07 DIAGNOSIS — R351 Nocturia: Secondary | ICD-10-CM | POA: Diagnosis not present

## 2024-04-08 NOTE — Progress Notes (Signed)
 Sent message, via epic in basket, requesting orders in epic from Careers adviser.

## 2024-04-10 NOTE — Patient Instructions (Addendum)
 SURGICAL WAITING ROOM VISITATION Patients having surgery or a procedure may have no more than 2 support people in the waiting area - these visitors may rotate in the visitor waiting room.   If the patient needs to stay at the hospital during part of their recovery, the visitor guidelines for inpatient rooms apply.  PRE-OP VISITATION  Pre-op nurse will coordinate an appropriate time for 1 support person to accompany the patient in pre-op.  This support person may not rotate.  This visitor will be contacted when the time is appropriate for the visitor to come back in the pre-op area.  Please refer to the [facility] website for the visitor guidelines for Inpatients (after your surgery is over and you are in a regular room).  You are not required to quarantine at this time prior to your surgery. However, you must do this: Hand Hygiene often Do NOT share personal items Notify your provider if you are in close contact with someone who has COVID or you develop fever 100.4 or greater, new onset of sneezing, cough, sore throat, shortness of breath or body aches.  If you test positive for Covid or have been in contact with anyone that has tested positive in the last 10 days please notify you surgeon.    Your procedure is scheduled on:  MONDAY  April 18, 2024  Report to [facility] Main Entrance: [place] entrance where the [employer] is available.   Report to admitting at: 05:15 AM  Call this number if you have any questions or problems the morning of surgery [phone]  Do not eat food after Midnight the night prior to your surgery/procedure.  After Midnight you may have the following liquids until  04:30  AM DAY OF SURGERY  Clear Liquid Diet Water Black Coffee (sugar ok, NO MILK/CREAM OR CREAMERS)  Tea (sugar ok, NO MILK/CREAM OR CREAMERS) regular and decaf                             Plain Jell-O  with no fruit (NO RED)                                           Fruit ices  (not with fruit pulp, NO RED)                                     Popsicles (NO RED)                                                                  Juice: NO CITRUS JUICES: only apple, WHITE grape, WHITE cranberry Sports drinks like Gatorade or Powerade (NO RED)                   The day of surgery:  Drink ONE (1) Pre-Surgery G2 at   04:30  AM the morning of surgery. Drink in one sitting. Do not sip.  This drink was given to you during your hospital pre-op appointment visit. Nothing else to drink after completing the  Pre-Surgery G2 : No candy, chewing gum or throat lozenges.    FOLLOW ANY ADDITIONAL PRE OP INSTRUCTIONS YOU RECEIVED FROM YOUR SURGEON'S OFFICE!!!   Oral Hygiene is also important to reduce your risk of infection.        Remember - BRUSH YOUR TEETH THE MORNING OF SURGERY WITH YOUR REGULAR TOOTHPASTE  Do NOT smoke after Midnight the night before surgery.  STOP TAKING all Vitamins, Herbs and supplements 1 week before your surgery.   Take ONLY these medicines the morning of surgery with A SIP OF WATER: Solifenacin , Prilosec , and EITHER Tylenol  OR Tramadol  if needed for pain. You may use your Eye drops and Flonase  nasal spray if needed.                    You may not have any metal on your body including jewelry, and body piercing  Do not wear  lotions, powders, cologne, or deodorant  Men may shave face and neck.  Contacts, Hearing Aids, dentures or bridgework may not be worn into surgery. DENTURES WILL BE REMOVED PRIOR TO SURGERY PLEASE DO NOT APPLY Poly grip OR ADHESIVES!!!  You may bring a small overnight bag with you on the day of surgery, only pack items that are not valuable. [facility] IS NOT RESPONSIBLE   FOR VALUABLES THAT ARE LOST OR STOLEN.   Patients discharged on the day of surgery will not be allowed to drive home.  Someone NEEDS to stay with you for the first 24 hours after anesthesia.  Do not bring your home medications to the hospital. The Pharmacy  will dispense medications listed on your medication list to you during your admission in the Hospital.  Please read over the following fact sheets you were given: IF YOU HAVE QUESTIONS ABOUT YOUR PRE-OP INSTRUCTIONS, PLEASE CALL [phone].     Pre-operative 5 CHG Bath Instructions   You can play a key role in reducing the risk of infection after surgery. Your skin needs to be as free of germs as possible. You can reduce the number of germs on your skin by washing with CHG (chlorhexidine gluconate) soap before surgery. CHG is an antiseptic soap that kills germs and continues to kill germs even after washing.   DO NOT use if you have an allergy to chlorhexidine/CHG or antibacterial soaps. If your skin becomes reddened or irritated, stop using the CHG and notify one of our RNs at [phone]  Please shower with the CHG soap starting 4 days before surgery using the following schedule: START SHOWERS ON   THURSDAY  04-14-2024  Please keep in mind the following:  DO NOT shave, including legs and underarms, starting the day of your first shower.   You may shave your face at any point before/day of surgery.   Place clean sheets on your bed the day you start using CHG soap. Use a clean washcloth (not used since being washed) for each shower. DO NOT sleep with pets once you start using the CHG.   CHG Shower Instructions:  If you choose to wash your hair and private area, wash first with your normal shampoo/soap.  After you use shampoo/soap, rinse your hair and body thoroughly to remove shampoo/soap residue.  Turn the water OFF and apply about 3 tablespoons (45 ml) of CHG soap to a CLEAN washcloth.  Apply CHG soap ONLY FROM YOUR NECK DOWN TO YOUR TOES (washing for 3-5 minutes)  DO NOT use CHG soap on face, private areas, open  wounds, or sores.  Pay special attention to the area where your surgery is being performed.  If you are having back surgery, having someone wash your back for you may be helpful.  Wait 2 minutes after CHG soap is applied, then you may rinse off the CHG soap.  Pat dry with a clean towel  Put on clean clothes/pajamas   If you choose to wear lotion, please use ONLY the CHG-compatible lotions on the back of this paper.     Additional instructions for the day of surgery: DO NOT APPLY any lotions, deodorants, cologne, or perfumes.   Put on clean/comfortable clothes.  Brush your teeth.  Ask your nurse before applying any prescription medications to the skin.      CHG Compatible Lotions   Aveeno Moisturizing lotion  Cetaphil Moisturizing Cream  Cetaphil Moisturizing Lotion  Clairol Herbal Essence Moisturizing Lotion, Dry Skin  Clairol Herbal Essence Moisturizing Lotion, Extra Dry Skin  Clairol Herbal Essence Moisturizing Lotion, Normal Skin  Curel Age Defying Therapeutic Moisturizing Lotion with Alpha Hydroxy  Curel Extreme Care Body Lotion  Curel Soothing Hands Moisturizing Hand Lotion  Curel Therapeutic Moisturizing Cream, Fragrance-Free  Curel Therapeutic Moisturizing Lotion, Fragrance-Free  Curel Therapeutic Moisturizing Lotion, Original Formula  Eucerin Daily Replenishing Lotion  Eucerin Dry Skin Therapy Plus Alpha Hydroxy Crme  Eucerin Dry Skin Therapy Plus Alpha Hydroxy Lotion  Eucerin Original Crme  Eucerin Original Lotion  Eucerin Plus Crme Eucerin Plus Lotion  Eucerin TriLipid Replenishing Lotion  Keri Anti-Bacterial Hand Lotion  Keri Deep Conditioning Original Lotion Dry Skin Formula Softly Scented  Keri Deep Conditioning Original Lotion, Fragrance Free Sensitive Skin Formula  Keri Lotion Fast Absorbing Fragrance Free Sensitive Skin Formula  Keri Lotion Fast Absorbing Softly Scented Dry Skin Formula  Keri Original Lotion  Keri Skin Renewal Lotion Keri Silky Smooth  Lotion  Keri Silky Smooth Sensitive Skin Lotion  Nivea Body Creamy Conditioning Oil  Nivea Body Extra Enriched Lotion  Nivea Body Original Lotion  Nivea Body Sheer Moisturizing Lotion Nivea Crme  Nivea Skin Firming Lotion  NutraDerm 30 Skin Lotion  NutraDerm Skin Lotion  NutraDerm Therapeutic Skin Cream  NutraDerm Therapeutic Skin Lotion  ProShield Protective Hand Cream  Provon moisturizing lotion   FAILURE TO FOLLOW THESE INSTRUCTIONS MAY RESULT IN THE CANCELLATION OF YOUR SURGERY  PATIENT SIGNATURE_________________________________  NURSE SIGNATURE__________________________________  ________________________________________________________________________        Nasario Exon    An incentive spirometer is a tool that can help keep your lungs clear and active. This tool measures how well you are filling your lungs with each breath.  Taking long deep breaths may help reverse or decrease the chance of developing breathing (pulmonary) problems (especially infection) following: A long period of time when you are unable to move or be active. BEFORE THE PROCEDURE  If the spirometer includes an indicator to show your best effort, your nurse or respiratory therapist will set it to a desired goal. If possible, sit up straight or lean slightly forward. Try not to slouch. Hold the incentive spirometer in an upright position. INSTRUCTIONS FOR USE  Sit on the edge of your bed if possible, or sit up as far as you can in bed or on a chair. Hold the incentive spirometer in an upright position. Breathe out normally. Place the mouthpiece in your mouth and seal your lips tightly around it. Breathe in slowly and as deeply as possible, raising the piston or the ball toward the top of the column. Hold your breath for 3-5 seconds or for as long as possible. Allow the piston or ball to fall to the bottom of the column. Remove the mouthpiece from your mouth and breathe out normally. Rest  for a few seconds and repeat Steps 1 through 7 at least 10 times every 1-2 hours when you are awake. Take your time and take a few normal breaths between deep breaths. The spirometer may include an indicator to show your best effort. Use the indicator as a goal to work toward during each repetition. After each set of 10 deep breaths, practice coughing to be sure your lungs are clear. If you have an incision (the cut made at the time of surgery), support your incision when coughing by placing a pillow or rolled up towels firmly against it. Once you are able to get out of bed, walk around indoors and cough well. You may stop using the incentive spirometer when instructed by your caregiver.  RISKS AND COMPLICATIONS Take your time so you do not get dizzy or light-headed. If you are in pain, you may need to take or ask for pain medication before doing incentive spirometry. It is harder to take a deep breath if you are having pain. AFTER USE Rest and breathe slowly and easily. It can be helpful to keep track of a log of your progress. Your caregiver can provide you with a simple table to help with this. If you are using the spirometer at home, follow these instructions: SEEK MEDICAL CARE IF:  You are having difficultly using the spirometer. You have trouble using the spirometer as often as instructed. Your pain medication is not giving enough relief while using the spirometer. You develop fever of 100.5 F (38.1 C) or higher.                                                                                                    SEEK IMMEDIATE MEDICAL CARE IF:  You cough up bloody sputum that had not been present before. You develop fever of 102 F (38.9 C) or greater. You develop worsening pain at or near the incision site. MAKE SURE YOU:  Understand these instructions. Will watch your condition.  Will get help right away if you are not doing well or get worse. Document Released: 12/29/2006 Document  Revised: 11/10/2011 Document Reviewed: 03/01/2007 [facility] Patient Information 2014 [place], [place].        If you would like to see a video about joint replacement:   [email or web address]

## 2024-04-10 NOTE — Progress Notes (Signed)
 COVID Vaccine received:  []  No [x]  Yes Date of any COVID positive Test in last 90 days:  PCP - Lynwood Rush, MD  Cardiologist -   Chest x-ray - 02-20-2024  1v  Epic,  10-15-22  2v  Epic EKG - 02-22-2024  Epic  Stress Test -  ECHO -  Cardiac Cath -  CT Coronary Calcium score:   Pacemaker / ICD device [x]  No []  Yes   Spinal Cord Stimulator:[x]  No []  Yes       History of Sleep Apnea? [x]  No []  Yes   CPAP used?- [x]  No []  Yes    Patient has: []  NO Hx DM   [x]  Pre-DM   []  DM1  []   DM2 Does the patient monitor blood sugar?   []  N/A   [x]  No []  Yes  Last A1c was: 6.4 on  07-30-2022     Blood Thinner / Instructions: None Aspirin  Instructions:  ASA 81mg   Hold  ERAS Protocol Ordered: []  No  [x]  Yes PRE-SURGERY []  ENSURE  [x]  G2  Patient is to be NPO after: 0430  Dental hx: []  Dentures:  []  N/A      []  Bridge or Partial:                   []  Loose or Damaged teeth:   Comments: Patient was hospitalized from 02-20-24 to 02-23-24 with AKI sepsis.   Activity level: Able to walk up 2 flights of stairs without becoming significantly short of breath or having chest pain?  []  No   []    Yes  Patient can perform ADLs without assistance. []  No   []   Yes  Anesthesia review: Pre-DM- no meds, GERD, HTN no meds,  ?LFTs,  Patient denies any S&S of respiratory illness or Covid - no shortness of breath, fever, cough or chest pain at PAT appointment.  Patient verbalized understanding and agreement to the Pre-Surgical Instructions that were given to them at this PAT appointment. Patient was also educated of the need to review these PAT instructions again prior to his surgery.I reviewed the appropriate phone numbers to call if they have any and questions or concerns.

## 2024-04-12 ENCOUNTER — Encounter (HOSPITAL_COMMUNITY)
Admission: RE | Admit: 2024-04-12 | Discharge: 2024-04-12 | Disposition: A | Discharge status: Home / Self Care | Source: Ambulatory Visit | Attending: Orthopedic Surgery | Admitting: Orthopedic Surgery

## 2024-04-12 ENCOUNTER — Other Ambulatory Visit: Payer: Self-pay

## 2024-04-12 ENCOUNTER — Encounter (HOSPITAL_COMMUNITY): Payer: Self-pay

## 2024-04-12 VITALS — BP 122/65 | HR 70 | Temp 98.4°F | Resp 16 | Ht 66.5 in | Wt 170.0 lb

## 2024-04-12 DIAGNOSIS — Z01818 Encounter for other preprocedural examination: Secondary | ICD-10-CM

## 2024-04-12 DIAGNOSIS — R7989 Other specified abnormal findings of blood chemistry: Secondary | ICD-10-CM | POA: Diagnosis not present

## 2024-04-12 DIAGNOSIS — Z01812 Encounter for preprocedural laboratory examination: Secondary | ICD-10-CM | POA: Diagnosis not present

## 2024-04-12 DIAGNOSIS — I10 Essential (primary) hypertension: Secondary | ICD-10-CM | POA: Insufficient documentation

## 2024-04-12 DIAGNOSIS — M1712 Unilateral primary osteoarthritis, left knee: Secondary | ICD-10-CM | POA: Insufficient documentation

## 2024-04-12 HISTORY — DX: Unspecified osteoarthritis, unspecified site: M19.90

## 2024-04-12 HISTORY — DX: Other complications of anesthesia, initial encounter: T88.59XA

## 2024-04-12 HISTORY — DX: Gastro-esophageal reflux disease without esophagitis: K21.9

## 2024-04-12 HISTORY — DX: Personal history of urinary calculi: Z87.442

## 2024-04-12 HISTORY — DX: Other specified abnormal findings of blood chemistry: R79.89

## 2024-04-12 HISTORY — DX: Prediabetes: R73.03

## 2024-04-12 LAB — CBC
HCT: 45.3 % (ref 39.0–52.0)
Hemoglobin: 14.8 g/dL (ref 13.0–17.0)
MCH: 32.4 pg (ref 26.0–34.0)
MCHC: 32.7 g/dL (ref 30.0–36.0)
MCV: 99.1 fL (ref 80.0–100.0)
Platelets: 308 K/uL (ref 150–400)
RBC: 4.57 MIL/uL (ref 4.22–5.81)
RDW: 12.3 % (ref 11.5–15.5)
WBC: 10 K/uL (ref 4.0–10.5)
nRBC: 0 % (ref 0.0–0.2)

## 2024-04-12 LAB — COMPREHENSIVE METABOLIC PANEL WITH GFR
ALT: 18 U/L (ref 0–44)
AST: 22 U/L (ref 15–41)
Albumin: 4.1 g/dL (ref 3.5–5.0)
Alkaline Phosphatase: 66 U/L (ref 38–126)
Anion gap: 10 (ref 5–15)
BUN: 18 mg/dL (ref 8–23)
CO2: 25 mmol/L (ref 22–32)
Calcium: 9.8 mg/dL (ref 8.9–10.3)
Chloride: 103 mmol/L (ref 98–111)
Creatinine, Ser: 0.63 mg/dL (ref 0.61–1.24)
GFR, Estimated: >60 mL/min (ref >60)
Glucose, Bld: 93 mg/dL (ref 70–99)
Potassium: 4.2 mmol/L (ref 3.5–5.1)
Sodium: 138 mmol/L (ref 135–145)
Total Bilirubin: 0.5 mg/dL (ref 0.0–1.2)
Total Protein: 7.8 g/dL (ref 6.5–8.1)

## 2024-04-12 LAB — SURGICAL PCR SCREEN
MRSA, PCR: NEGATIVE
Staphylococcus aureus: NEGATIVE

## 2024-04-14 DIAGNOSIS — M1712 Unilateral primary osteoarthritis, left knee: Secondary | ICD-10-CM | POA: Diagnosis not present

## 2024-04-14 DIAGNOSIS — R262 Difficulty in walking, not elsewhere classified: Secondary | ICD-10-CM | POA: Diagnosis not present

## 2024-04-14 NOTE — Care Plan (Signed)
 Ortho Bundle Case Management Note  Patient Details  Name: Andrew Foster MRN: [id number] Date of Birth: [birth year]   Met with patient in the OPPT setting. He will discharge to home with son to assist. Rolling walker ordered for home use. OPPT set up with [facility]. Patient and MD in agreement with plan. Choice offered.                 DME Arranged:  Vannie rolling DME Agency:  [employer]  HH Arranged:    HH Agency:     Additional Comments: Please contact me with any questions of if this plan should need to change.  Charlies Pitch,  RN,BSN,MHA,CCM  [facility]ist  [phone] 04/14/2024, 9:40 AM

## 2024-04-15 NOTE — H&P (Signed)
 TOTAL KNEE ADMISSION H&P  Patient is being admitted for left total knee arthroplasty.  Subjective:  Chief Complaint:left knee pain.  HPI: Andrew Foster, 83 y.o. male, has a history of pain and functional disability in the left knee due to arthritis and has failed non-surgical conservative treatments for greater than 12 weeks to includeNSAID's and/or analgesics, corticosteriod injections, use of assistive devices, and activity modification.  Onset of symptoms was gradual, starting several years ago with gradually worsening course since that time. The patient noted no past surgery on the left knee(s).  Patient currently rates pain in the left knee(s) at 10 out of 10 with activity. Patient has night pain, worsening of pain with activity and weight bearing, pain that interferes with activities of daily living, pain with passive range of motion, crepitus, and joint swelling.  Patient has evidence of subchondral sclerosis, periarticular osteophytes, joint subluxation, and joint space narrowing by imaging studies.  There is no active infection.  Patient Active Problem List   Diagnosis Date Noted   OAB (overactive bladder) 03/03/2024   Ureteral stone 02/21/2024   Sepsis (HCC) 02/20/2024   Diarrhea due to malabsorption 02/18/2024   Diarrhea of infectious origin 02/04/2024   Right hip pain 01/23/2023   Enlarged prostate 10/28/2022   Bilateral recurrent inguinal hernia without obstruction or gangrene 10/28/2022   Baker's cyst, left 12/24/2021   Degenerative arthritis of left knee 12/24/2021   Left knee pain 10/25/2021   BPPV (benign paroxysmal positional vertigo) 10/16/2020   Arthritis 06/18/2018   Ventral hernia 06/10/2017   Acute pyelonephritis 11/01/2016   Routine general medical examination at a health care facility 09/14/2015   Elevated alkaline phosphatase level 10/19/2014   Mixed hyperlipidemia 02/28/2013   Lipoma of back 11/22/2009   ELEVATED PROSTATE SPECIFIC ANTIGEN 11/06/2008    Esophageal reflux 05/01/2008   Pre-diabetes 05/01/2008   Past Medical History:  Diagnosis Date   Acute pyelonephritis 11/01/2016   hospitalized 02-20-24 to 02-23-24 for AKI sepsis   Adenomatous polyps of colon 01/2017   no f/u - age   Arthritis    BPH (benign prostatic hyperplasia)    Complication of anesthesia    slow to wake up   Diverticulosis    Elevated LFTs    GERD (gastroesophageal reflux disease)    History of kidney stones    Hypertension    Pre-diabetes    no Meds    Past Surgical History:  Procedure Laterality Date   COLONOSCOPY     Tics; [facility]   CYSTOSCOPY WITH RETROGRADE PYELOGRAM, URETEROSCOPY AND STENT PLACEMENT Left 02/21/2024   Procedure: CYSTOURETEROSCOPY, WITH RETROGRADE PYELOGRAM AND STENT INSERTION;  Surgeon: Nieves Cough, MD;  Location: [facility];  Service: Urology;  Laterality: Left;   CYSTOSCOPY/RETROGRADE/URETEROSCOPY/STONE EXTRACTION WITH BASKET Left 02/21/2024   Procedure: CYSTOSCOPY, WITH CALCULUS REMOVAL USING BASKET;  Surgeon: Nieves Cough, MD;  Location: [facility];  Service: Urology;  Laterality: Left;   EYE SURGERY Bilateral    cataract extraction w/ IOL   HERNIA REPAIR Right    inguinal x 2 and umbilical x 1   intra articular steroids     R shoulder   PROSTATE BIOPSY     Dr Aleene   VASECTOMY      No current facility-administered medications for this encounter.   Current Outpatient Medications  Medication Sig Dispense Refill Last Dose/Taking   aspirin  EC 81 MG tablet Take 81 mg by mouth daily. Swallow whole.   Taking   Cholecalciferol (VITAMIN D3) 25 MCG (1000 UT)  CAPS Take 1,000 Units by mouth daily.   Taking   finasteride (PROSCAR) 5 MG tablet Take 5 mg by mouth every evening.   Taking   fluticasone (FLONASE) 50 MCG/ACT nasal spray Place 1 spray into both nostrils 2 (two) times daily as needed for allergies or rhinitis.   Taking As Needed   glucosamine-chondroitin 500-400 MG tablet Take 1 tablet by mouth daily.   Taking    Multiple Vitamin (MULTIVITAMIN) tablet Take 1 tablet by mouth daily with breakfast.   Taking   pravastatin (PRAVACHOL) 20 MG tablet Take 1 tablet by mouth once daily 90 tablet 0 Taking   PRILOSEC OTC 20 MG tablet Take 20 mg by mouth daily before breakfast.   Taking   solifenacin (VESICARE) 5 MG tablet Take 1 tablet (5 mg total) by mouth daily. 90 tablet 3 Taking   SYSTANE ULTRA PF 0.4-0.3 % SOLN Place 1 drop into both eyes 3 (three) times daily as needed (for dryness).   Taking As Needed   tamsulosin (FLOMAX) 0.4 MG CAPS capsule Take 1 capsule by mouth once daily at bedtime 90 capsule 0 Taking   traMADol (ULTRAM) 50 MG tablet Take 1 tablet by mouth once daily 30 tablet 0 Taking   TYLENOL 325 MG tablet Take 325-650 mg by mouth every 8 (eight) hours as needed for mild pain (pain score 1-3) or headache.   Taking As Needed   Vit B6-Vit B12-Omega 3 Acids (VITAMIN B PLUS+ PO) Take 1 tablet by mouth daily.   Taking   meclizine (ANTIVERT) 12.5 MG tablet Take 1 tablet (12.5 mg total) by mouth 3 (three) times daily as needed for dizziness. 30 tablet 1    No Known Allergies  Social History   Tobacco Use   Smoking status: Former    Current packs/day: 0.00    Types: Cigarettes    Quit date: 09/01/1974    Years since quitting: 49.6   Smokeless tobacco: Never   Tobacco comments:    smoked 1956-1976, up to 3 ppd  Substance Use Topics   Alcohol use: No    Family History  Problem Relation Age of Onset   Heart attack Mother 59   Diabetes Mother    Diabetes Sister        TWO sisters   Diabetes Brother        TWO brothers   Diabetes type I Brother    Stroke Neg Hx    Cancer Neg Hx    Colon cancer Neg Hx      Review of Systems  Constitutional: Negative.   HENT: Negative.    Eyes:        Poor vision  Respiratory: Negative.    Cardiovascular: Negative.   Gastrointestinal: Negative.   Endocrine: Negative.   Genitourinary:        ED  Musculoskeletal:  Positive for arthralgias, joint swelling  and myalgias.  Skin: Negative.   Allergic/Immunologic: Negative.   Neurological: Negative.   Hematological:  Bruises/bleeds easily.  Psychiatric/Behavioral: Negative.      Objective:  Physical Exam Constitutional:      Appearance: Normal appearance. He is normal weight.  HENT:     Head: Normocephalic and atraumatic.     Nose: Nose normal.  Eyes:     Pupils: Pupils are equal, round, and reactive to light.  Cardiovascular:     Pulses: Normal pulses.  Pulmonary:     Effort: Pulmonary effort is normal.  Musculoskeletal:        General: Tenderness  present.     Cervical back: Normal range of motion and neck supple.     Comments: the patient's right knee has good strength good range of motion.  Patient's left knee has a range from 5 to 110.  Pain over the medial joint line.  Obvious varus deformity.  Crepitus with range of motion.  Calves are soft and nontender.  Skin:    General: Skin is warm and dry.  Neurological:     General: No focal deficit present.     Mental Status: He is alert and oriented to person, place, and time. Mental status is at baseline.  Psychiatric:        Mood and Affect: Mood normal.        Behavior: Behavior normal.        Thought Content: Thought content normal.        Judgment: Judgment normal.     Vital signs in last 24 hours:    Labs:   Estimated body mass index is 27.03 kg/m as calculated from the following:   Height as of 04/12/24: 5' 6.5 (1.689 m).   Weight as of 04/12/24: 77.1 kg.   Imaging Review Plain radiographs demonstrate  bilateral AP weightbearing, bilateral Rosenberg, lateral sunrise views of the left knee are taken and reviewed in office today.  This shows end-stage bone-on-bone arthritis medial compartment left knee with lateral subluxation of the tibia beneath the femur.   Assessment/Plan:  End stage arthritis, left knee   The patient history, physical examination, clinical judgment of the provider and imaging studies are  consistent with end stage degenerative joint disease of the left knee(s) and total knee arthroplasty is deemed medically necessary. The treatment options including medical management, injection therapy arthroscopy and arthroplasty were discussed at length. The risks and benefits of total knee arthroplasty were presented and reviewed. The risks due to aseptic loosening, infection, stiffness, patella tracking problems, thromboembolic complications and other imponderables were discussed. The patient acknowledged the explanation, agreed to proceed with the plan and consent was signed. Patient is being admitted for inpatient treatment for surgery, pain control, PT, OT, prophylactic antibiotics, VTE prophylaxis, progressive ambulation and ADL's and discharge planning. The patient is planning to be discharged home with home health services     Patient's anticipated LOS is less than 2 midnights, meeting these requirements: - Younger than 39 - Lives within 1 hour of care - Has a competent adult at home to recover with post-op recover - NO history of  - Chronic pain requiring opiods  - Diabetes  - Coronary Artery Disease  - Heart failure  - Heart attack  - Stroke  - DVT/VTE  - Cardiac arrhythmia  - Respiratory Failure/COPD  - Renal failure  - Anemia  - Advanced Liver disease

## 2024-04-17 NOTE — Anesthesia Preprocedure Evaluation (Signed)
 Anesthesia Evaluation  Patient identified by MRN, date of birth, ID band Patient awake    Reviewed: Allergy & Precautions, H&P , NPO status , Patient's Chart, lab work & pertinent test results  History of Anesthesia Complications Negative for: history of anesthetic complications  Airway Mallampati: II  TM Distance: >3 FB Neck ROM: Full    Dental  (+) Edentulous Upper, Implants   Pulmonary neg pulmonary ROS, former smoker   Pulmonary exam normal breath sounds clear to auscultation       Cardiovascular hypertension, (-) angina (-) Past MI Normal cardiovascular exam Rhythm:Regular Rate:Normal     Neuro/Psych neg Seizures negative neurological ROS  negative psych ROS   GI/Hepatic Neg liver ROS,GERD  ,,  Endo/Other  negative endocrine ROS    Renal/GU negative Renal ROS  negative genitourinary   Musculoskeletal negative musculoskeletal ROS (+) Arthritis ,    Abdominal   Peds negative pediatric ROS (+)  Hematology negative hematology ROS (+)   Anesthesia Other Findings Plt 308  Reproductive/Obstetrics negative OB ROS                              Anesthesia Physical Anesthesia Plan  ASA: 3  Anesthesia Plan: Spinal and Regional   Post-op Pain Management: Tylenol  PO (pre-op)* and Regional block*   Induction: Intravenous  PONV Risk Score and Plan: 1 and Treatment may vary due to age or medical condition, Ondansetron , Dexamethasone  and TIVA  Airway Management Planned: Natural Airway and Simple Face Mask  Additional Equipment: None  Intra-op Plan:   Post-operative Plan:   Informed Consent: I have reviewed the patients History and Physical, chart, labs and discussed the procedure including the risks, benefits and alternatives for the proposed anesthesia with the patient or authorized representative who has indicated his/her understanding and acceptance.     Dental advisory  given  Plan Discussed with: CRNA  Anesthesia Plan Comments:         Anesthesia Quick Evaluation

## 2024-04-18 ENCOUNTER — Ambulatory Visit (HOSPITAL_BASED_OUTPATIENT_CLINIC_OR_DEPARTMENT_OTHER): Payer: Self-pay

## 2024-04-18 ENCOUNTER — Other Ambulatory Visit: Payer: Self-pay

## 2024-04-18 ENCOUNTER — Observation Stay (HOSPITAL_COMMUNITY)
Admission: RE | Admit: 2024-04-18 | Discharge: 2024-04-19 | Disposition: A | Discharge status: Home / Self Care | Attending: Orthopedic Surgery | Admitting: Orthopedic Surgery

## 2024-04-18 ENCOUNTER — Encounter (HOSPITAL_COMMUNITY)
Admission: RE | Disposition: A | Discharge status: Home / Self Care | Payer: Self-pay | Source: Home / Self Care | Attending: Orthopedic Surgery

## 2024-04-18 ENCOUNTER — Ambulatory Visit (HOSPITAL_COMMUNITY): Payer: Self-pay | Admitting: Medical

## 2024-04-18 ENCOUNTER — Encounter (HOSPITAL_COMMUNITY): Payer: Self-pay | Admitting: Orthopedic Surgery

## 2024-04-18 DIAGNOSIS — Z96652 Presence of left artificial knee joint: Secondary | ICD-10-CM | POA: Diagnosis not present

## 2024-04-18 DIAGNOSIS — I10 Essential (primary) hypertension: Secondary | ICD-10-CM

## 2024-04-18 DIAGNOSIS — E782 Mixed hyperlipidemia: Secondary | ICD-10-CM | POA: Diagnosis not present

## 2024-04-18 DIAGNOSIS — Z7982 Long term (current) use of aspirin: Secondary | ICD-10-CM | POA: Diagnosis not present

## 2024-04-18 DIAGNOSIS — Z87891 Personal history of nicotine dependence: Secondary | ICD-10-CM

## 2024-04-18 DIAGNOSIS — M1712 Unilateral primary osteoarthritis, left knee: Principal | ICD-10-CM | POA: Diagnosis present

## 2024-04-18 DIAGNOSIS — G8918 Other acute postprocedural pain: Secondary | ICD-10-CM | POA: Diagnosis not present

## 2024-04-18 HISTORY — PX: TOTAL KNEE ARTHROPLASTY: SHX125

## 2024-04-18 SURGERY — ARTHROPLASTY, KNEE, TOTAL
Anesthesia: Regional | Site: Knee | Laterality: Left

## 2024-04-18 MED ORDER — SODIUM CHLORIDE (PF) 0.9 % IJ SOLN
INTRAMUSCULAR | Status: AC
  Filled 2024-04-18: qty 50

## 2024-04-18 MED ORDER — LACTATED RINGERS IV SOLN: INTRAVENOUS | Status: DC

## 2024-04-18 MED ORDER — METHOCARBAMOL 500 MG PO TABS
500.0000 mg | ORAL_TABLET | Freq: Four times a day (QID) | ORAL | Status: DC | PRN
  Administered 2024-04-18 – 2024-04-19 (×2): 500 mg via ORAL
  Filled 2024-04-18 (×2): qty 1

## 2024-04-18 MED ORDER — TRAMADOL HCL 50 MG PO TABS
50.0000 mg | ORAL_TABLET | Freq: Four times a day (QID) | ORAL | Status: DC
  Administered 2024-04-18 – 2024-04-19 (×4): 50 mg via ORAL
  Filled 2024-04-18 (×4): qty 1

## 2024-04-18 MED ORDER — BISACODYL 5 MG PO TBEC: 5.0000 mg | DELAYED_RELEASE_TABLET | Freq: Every day | ORAL | Status: DC | PRN

## 2024-04-18 MED ORDER — OXYCODONE HCL 5 MG PO TABS
10.0000 mg | ORAL_TABLET | ORAL | Status: DC | PRN
  Administered 2024-04-18 – 2024-04-19 (×3): 10 mg via ORAL
  Filled 2024-04-18: qty 2

## 2024-04-18 MED ORDER — METOCLOPRAMIDE HCL 5 MG PO TABS: 5.0000 mg | ORAL_TABLET | Freq: Three times a day (TID) | ORAL | Status: DC | PRN

## 2024-04-18 MED ORDER — CHLORHEXIDINE GLUCONATE 0.12 % MT SOLN
15.0000 mL | Freq: Once | OROMUCOSAL | Status: AC
  Administered 2024-04-18: 15 mL via OROMUCOSAL

## 2024-04-18 MED ORDER — TAMSULOSIN HCL 0.4 MG PO CAPS: 0.4000 mg | ORAL_CAPSULE | Freq: Every day | ORAL | Status: DC

## 2024-04-18 MED ORDER — POLYVINYL ALCOHOL 1.4 % OP SOLN: 1.0000 [drp] | Freq: Three times a day (TID) | OPHTHALMIC | Status: DC | PRN

## 2024-04-18 MED ORDER — TRANEXAMIC ACID-NACL 1000-0.7 MG/100ML-% IV SOLN
1000.0000 mg | Freq: Once | INTRAVENOUS | Status: AC
  Administered 2024-04-18: 1000 mg via INTRAVENOUS

## 2024-04-18 MED ORDER — METOCLOPRAMIDE HCL 5 MG/ML IJ SOLN: 5.0000 mg | Freq: Three times a day (TID) | INTRAMUSCULAR | Status: DC | PRN

## 2024-04-18 MED ORDER — ORAL CARE MOUTH RINSE: 15.0000 mL | Freq: Once | OROMUCOSAL | Status: AC

## 2024-04-18 MED ORDER — TIZANIDINE HCL 2 MG PO TABS: 2.0000 mg | ORAL_TABLET | Freq: Four times a day (QID) | ORAL | 0 refills | Status: AC | PRN

## 2024-04-18 MED ORDER — FINASTERIDE 5 MG PO TABS: 5.0000 mg | ORAL_TABLET | Freq: Every evening | ORAL | Status: DC

## 2024-04-18 MED ORDER — CEFAZOLIN SODIUM-DEXTROSE 2-4 GM/100ML-% IV SOLN
2.0000 g | INTRAVENOUS | Status: AC
  Administered 2024-04-18: 2 g via INTRAVENOUS
  Filled 2024-04-18: qty 100

## 2024-04-18 MED ORDER — SODIUM CHLORIDE 0.9 % IV SOLN
INTRAVENOUS | Status: DC | PRN
  Administered 2024-04-18: 2000 mg via TOPICAL

## 2024-04-18 MED ORDER — PHENYLEPHRINE 80 MCG/ML (10ML) SYRINGE FOR IV PUSH (FOR BLOOD PRESSURE SUPPORT)
PREFILLED_SYRINGE | INTRAVENOUS | Status: DC | PRN
  Administered 2024-04-18 (×7): 80 ug via INTRAVENOUS

## 2024-04-18 MED ORDER — FESOTERODINE FUMARATE ER 4 MG PO TB24
4.0000 mg | ORAL_TABLET | Freq: Every day | ORAL | Status: DC
  Administered 2024-04-19: 4 mg via ORAL
  Filled 2024-04-18: qty 1

## 2024-04-18 MED ORDER — OXYCODONE HCL 5 MG PO TABS: 5.0000 mg | ORAL_TABLET | Freq: Once | ORAL | Status: DC | PRN

## 2024-04-18 MED ORDER — BUPIVACAINE LIPOSOME 1.3 % IJ SUSP: 20.0000 mL | Freq: Once | INTRAMUSCULAR | Status: DC

## 2024-04-18 MED ORDER — TRANEXAMIC ACID-NACL 1000-0.7 MG/100ML-% IV SOLN
INTRAVENOUS | Status: AC
  Filled 2024-04-18: qty 100

## 2024-04-18 MED ORDER — ASPIRIN 81 MG PO TBEC: 81.0000 mg | DELAYED_RELEASE_TABLET | Freq: Two times a day (BID) | ORAL | 0 refills | Status: AC

## 2024-04-18 MED ORDER — PROPOFOL 1000 MG/100ML IV EMUL
INTRAVENOUS | Status: AC
  Filled 2024-04-18: qty 100

## 2024-04-18 MED ORDER — FLEET ENEMA RE ENEM: 1.0000 | ENEMA | Freq: Once | RECTAL | Status: DC | PRN

## 2024-04-18 MED ORDER — ROPIVACAINE HCL 5 MG/ML IJ SOLN
INTRAMUSCULAR | Status: DC | PRN
Start: 2024-04-18 — End: 2024-04-18
  Administered 2024-04-18: 20 mL via PERINEURAL

## 2024-04-18 MED ORDER — BUPIVACAINE-EPINEPHRINE (PF) 0.25% -1:200000 IJ SOLN
INTRAMUSCULAR | Status: AC
  Filled 2024-04-18: qty 30

## 2024-04-18 MED ORDER — LACTATED RINGERS IV BOLUS
500.0000 mL | Freq: Once | INTRAVENOUS | Status: AC
  Administered 2024-04-18: 500 mL via INTRAVENOUS

## 2024-04-18 MED ORDER — PANTOPRAZOLE SODIUM 40 MG PO TBEC
40.0000 mg | DELAYED_RELEASE_TABLET | Freq: Every day | ORAL | Status: DC
  Administered 2024-04-19: 40 mg via ORAL
  Filled 2024-04-18: qty 1

## 2024-04-18 MED ORDER — ASPIRIN 81 MG PO CHEW
81.0000 mg | CHEWABLE_TABLET | Freq: Two times a day (BID) | ORAL | Status: DC
  Administered 2024-04-18 – 2024-04-19 (×2): 81 mg via ORAL
  Filled 2024-04-18 (×2): qty 1

## 2024-04-18 MED ORDER — ALUM & MAG HYDROXIDE-SIMETH 200-200-20 MG/5ML PO SUSP: 30.0000 mL | ORAL | Status: DC | PRN

## 2024-04-18 MED ORDER — ONDANSETRON HCL 4 MG PO TABS: 4.0000 mg | ORAL_TABLET | Freq: Four times a day (QID) | ORAL | Status: DC | PRN

## 2024-04-18 MED ORDER — BUPIVACAINE LIPOSOME 1.3 % IJ SUSP
INTRAMUSCULAR | Status: AC
Start: 2024-04-18 — End: 2024-04-18
  Filled 2024-04-18: qty 20

## 2024-04-18 MED ORDER — TRANEXAMIC ACID-NACL 1000-0.7 MG/100ML-% IV SOLN
1000.0000 mg | INTRAVENOUS | Status: AC
  Administered 2024-04-18: 1000 mg via INTRAVENOUS
  Filled 2024-04-18: qty 100

## 2024-04-18 MED ORDER — POVIDONE-IODINE 10 % EX SWAB: 2.0000 | Freq: Once | CUTANEOUS | Status: DC

## 2024-04-18 MED ORDER — MENTHOL 3 MG MT LOZG: 1.0000 | LOZENGE | OROMUCOSAL | Status: DC | PRN

## 2024-04-18 MED ORDER — HYDROMORPHONE HCL 1 MG/ML IJ SOLN
0.5000 mg | INTRAMUSCULAR | Status: DC | PRN
  Administered 2024-04-18: 0.5 mg via INTRAVENOUS

## 2024-04-18 MED ORDER — OXYCODONE HCL 5 MG PO TABS
5.0000 mg | ORAL_TABLET | ORAL | Status: DC | PRN
  Filled 2024-04-18 (×2): qty 2

## 2024-04-18 MED ORDER — ONDANSETRON HCL 4 MG/2ML IJ SOLN: 4.0000 mg | Freq: Four times a day (QID) | INTRAMUSCULAR | Status: DC | PRN

## 2024-04-18 MED ORDER — PHENYLEPHRINE 80 MCG/ML (10ML) SYRINGE FOR IV PUSH (FOR BLOOD PRESSURE SUPPORT)
PREFILLED_SYRINGE | INTRAVENOUS | Status: AC
  Filled 2024-04-18: qty 10

## 2024-04-18 MED ORDER — DEXAMETHASONE SODIUM PHOSPHATE 10 MG/ML IJ SOLN
10.0000 mg | Freq: Once | INTRAMUSCULAR | Status: AC
  Administered 2024-04-19: 10 mg via INTRAVENOUS
  Filled 2024-04-18: qty 1

## 2024-04-18 MED ORDER — PHENYLEPHRINE HCL-NACL 20-0.9 MG/250ML-% IV SOLN
INTRAVENOUS | Status: DC | PRN
  Administered 2024-04-18: 30 ug/min via INTRAVENOUS

## 2024-04-18 MED ORDER — OXYCODONE-ACETAMINOPHEN 5-325 MG PO TABS: 1.0000 | ORAL_TABLET | ORAL | 0 refills | Status: AC | PRN

## 2024-04-18 MED ORDER — TRANEXAMIC ACID 1000 MG/10ML IV SOLN
2000.0000 mg | INTRAVENOUS | Status: DC
  Filled 2024-04-18: qty 20

## 2024-04-18 MED ORDER — METHOCARBAMOL 1000 MG/10ML IJ SOLN: 500.0000 mg | Freq: Four times a day (QID) | INTRAMUSCULAR | Status: DC | PRN

## 2024-04-18 MED ORDER — KCL IN DEXTROSE-NACL 20-5-0.45 MEQ/L-%-% IV SOLN
INTRAVENOUS | Status: DC
  Filled 2024-04-18 (×4): qty 1000

## 2024-04-18 MED ORDER — DOCUSATE SODIUM 100 MG PO CAPS
100.0000 mg | ORAL_CAPSULE | Freq: Two times a day (BID) | ORAL | Status: DC
  Administered 2024-04-18 – 2024-04-19 (×2): 100 mg via ORAL
  Filled 2024-04-18 (×2): qty 1

## 2024-04-18 MED ORDER — PHENOL 1.4 % MT LIQD: 1.0000 | OROMUCOSAL | Status: DC | PRN

## 2024-04-18 MED ORDER — HYDROMORPHONE HCL 1 MG/ML IJ SOLN
INTRAMUSCULAR | Status: AC
  Filled 2024-04-18: qty 1

## 2024-04-18 MED ORDER — EPHEDRINE 5 MG/ML INJ
INTRAVENOUS | Status: AC
  Filled 2024-04-18: qty 5

## 2024-04-18 MED ORDER — DIPHENHYDRAMINE HCL 12.5 MG/5ML PO ELIX: 12.5000 mg | ORAL_SOLUTION | ORAL | Status: DC | PRN

## 2024-04-18 MED ORDER — ACETAMINOPHEN 500 MG PO TABS
1000.0000 mg | ORAL_TABLET | Freq: Four times a day (QID) | ORAL | Status: AC
  Administered 2024-04-18 – 2024-04-19 (×4): 1000 mg via ORAL
  Filled 2024-04-18 (×4): qty 2

## 2024-04-18 MED ORDER — FENTANYL CITRATE (PF) 100 MCG/2ML IJ SOLN
INTRAMUSCULAR | Status: DC | PRN
  Administered 2024-04-18: 50 ug via INTRAVENOUS

## 2024-04-18 MED ORDER — WATER FOR IRRIGATION, STERILE IR SOLN
Status: DC | PRN
  Administered 2024-04-18: 2000 mL

## 2024-04-18 MED ORDER — ACETAMINOPHEN 10 MG/ML IV SOLN: 1000.0000 mg | Freq: Once | INTRAVENOUS | Status: DC | PRN

## 2024-04-18 MED ORDER — BUPIVACAINE IN DEXTROSE 0.75-8.25 % IT SOLN
INTRATHECAL | Status: DC | PRN
  Administered 2024-04-18: 1.8 mL via INTRATHECAL

## 2024-04-18 MED ORDER — FENTANYL CITRATE PF 50 MCG/ML IJ SOSY: 25.0000 ug | PREFILLED_SYRINGE | INTRAMUSCULAR | Status: DC | PRN

## 2024-04-18 MED ORDER — 0.9 % SODIUM CHLORIDE (POUR BTL) OPTIME
TOPICAL | Status: DC | PRN
  Administered 2024-04-18: 1000 mL

## 2024-04-18 MED ORDER — PROPOFOL 500 MG/50ML IV EMUL
INTRAVENOUS | Status: DC | PRN
  Administered 2024-04-18: 20 mg via INTRAVENOUS
  Administered 2024-04-18: 50 ug/kg/min via INTRAVENOUS

## 2024-04-18 MED ORDER — FENTANYL CITRATE (PF) 100 MCG/2ML IJ SOLN
INTRAMUSCULAR | Status: AC
  Filled 2024-04-18: qty 2

## 2024-04-18 MED ORDER — POLYETHYLENE GLYCOL 3350 17 G PO PACK: 17.0000 g | PACK | Freq: Every day | ORAL | Status: DC | PRN

## 2024-04-18 MED ORDER — EPHEDRINE SULFATE-NACL 50-0.9 MG/10ML-% IV SOSY
PREFILLED_SYRINGE | INTRAVENOUS | Status: DC | PRN
  Administered 2024-04-18 (×5): 5 mg via INTRAVENOUS

## 2024-04-18 MED ORDER — FLUTICASONE PROPIONATE 50 MCG/ACT NA SUSP: 1.0000 | Freq: Two times a day (BID) | NASAL | Status: DC | PRN

## 2024-04-18 MED ORDER — ACETAMINOPHEN 325 MG PO TABS: 325.0000 mg | ORAL_TABLET | Freq: Four times a day (QID) | ORAL | Status: DC | PRN

## 2024-04-18 MED ORDER — OXYCODONE HCL 5 MG/5ML PO SOLN: 5.0000 mg | Freq: Once | ORAL | Status: DC | PRN

## 2024-04-18 MED ORDER — LACTATED RINGERS IV BOLUS: 250.0000 mL | Freq: Once | INTRAVENOUS | Status: DC

## 2024-04-18 MED ORDER — DROPERIDOL 2.5 MG/ML IJ SOLN: 0.6250 mg | Freq: Once | INTRAMUSCULAR | Status: DC | PRN

## 2024-04-18 MED ORDER — BUPIVACAINE-EPINEPHRINE (PF) 0.25% -1:200000 IJ SOLN
INTRAMUSCULAR | Status: DC | PRN
  Administered 2024-04-18: 120 mL

## 2024-04-18 MED ORDER — SODIUM CHLORIDE (PF) 0.9 % IJ SOLN
INTRAMUSCULAR | Status: AC
  Filled 2024-04-18: qty 20

## 2024-04-18 SURGICAL SUPPLY — 41 items
ATTUNE MED DOME PAT 38 KNEE (Knees) IMPLANT
ATTUNE PS FEM LT SZ 6 CEM KNEE (Femur) IMPLANT
ATTUNE PSRP INSR SZ6 8 KNEE (Insert) IMPLANT
BAG COUNTER SPONGE SURGICOUNT (BAG) IMPLANT
BAG DECANTER FOR FLEXI CONT (MISCELLANEOUS) ×2 IMPLANT
BAG ZIPLOCK 12X15 (MISCELLANEOUS) ×2 IMPLANT
BASE TIBIAL ROT PLAT SZ 7 KNEE (Knees) IMPLANT
BLADE SAG 18X100X1.27 (BLADE) ×2 IMPLANT
BLADE SAW SGTL 11.0X1.19X90.0M (BLADE) ×2 IMPLANT
BNDG ELASTIC 6X10 VLCR STRL LF (GAUZE/BANDAGES/DRESSINGS) ×2 IMPLANT
BOWL SMART MIX CTS (DISPOSABLE) ×2 IMPLANT
CEMENT HV SMART SET (Cement) ×4 IMPLANT
COVER SURGICAL LIGHT HANDLE (MISCELLANEOUS) ×2 IMPLANT
DRAPE INCISE IOBAN 66X45 STRL (DRAPES) IMPLANT
DRAPE U-SHAPE 47X51 STRL (DRAPES) ×2 IMPLANT
DRSG AQUACEL AG ADV 3.5X10 (GAUZE/BANDAGES/DRESSINGS) ×2 IMPLANT
DURAPREP 26ML APPLICATOR (WOUND CARE) ×2 IMPLANT
ELECT REM PT RETURN 15FT ADLT (MISCELLANEOUS) ×2 IMPLANT
GLOVE BIO SURGEON STRL SZ7.5 (GLOVE) ×2 IMPLANT
GLOVE BIO SURGEON STRL SZ8.5 (GLOVE) ×2 IMPLANT
GLOVE BIOGEL PI IND STRL 8 (GLOVE) ×2 IMPLANT
GLOVE BIOGEL PI IND STRL 9 (GLOVE) ×2 IMPLANT
GOWN STRL REUS W/ TWL XL LVL3 (GOWN DISPOSABLE) ×4 IMPLANT
HOOD PEEL AWAY T7 (MISCELLANEOUS) ×6 IMPLANT
IMMOBILIZER KNEE 20 THIGH 36 (SOFTGOODS) IMPLANT
KIT TURNOVER KIT A (KITS) ×2 IMPLANT
NDL HYPO 22X1.5 SAFETY MO (MISCELLANEOUS) ×4 IMPLANT
NEEDLE HYPO 22X1.5 SAFETY MO (MISCELLANEOUS) ×2 IMPLANT
NS IRRIG 1000ML POUR BTL (IV SOLUTION) ×2 IMPLANT
PACK TOTAL KNEE CUSTOM (KITS) ×2 IMPLANT
PIN STEINMAN FIXATION KNEE (PIN) IMPLANT
PROTECTOR NERVE ULNAR (MISCELLANEOUS) ×2 IMPLANT
SET HNDPC FAN SPRY TIP SCT (DISPOSABLE) ×2 IMPLANT
SUT VIC AB 0 CT1 36 (SUTURE) ×2 IMPLANT
SUT VIC AB 1 CTX36XBRD ANBCTR (SUTURE) ×2 IMPLANT
SUT VICRYL+ 3-0 36IN CT-1 (SUTURE) ×6 IMPLANT
SYR CONTROL 10ML LL (SYRINGE) ×4 IMPLANT
TRAY CATH INTERMITTENT SS 16FR (CATHETERS) ×2 IMPLANT
TUBE SUCTION HIGH CAP CLEAR NV (SUCTIONS) ×2 IMPLANT
WATER STERILE IRR 1000ML POUR (IV SOLUTION) ×4 IMPLANT
WRAP KNEE MAXI GEL POST OP (GAUZE/BANDAGES/DRESSINGS) ×2 IMPLANT

## 2024-04-18 NOTE — Discharge Instructions (Addendum)

## 2024-04-18 NOTE — Anesthesia Procedure Notes (Signed)
 Anesthesia Regional Block: Adductor canal block   Pre-Anesthetic Checklist: , timeout performed,  Correct Patient, Correct Site, Correct Laterality,  Correct Procedure, Correct Position, site marked,  Risks and benefits discussed,  Surgical consent,  Pre-op evaluation,  At surgeon's request and post-op pain management  Laterality: Left  Prep: chloraprep       Needles:  Injection technique: Single-shot  Needle Type: Echogenic Stimulator Needle     Needle Length: 9cm  Needle Gauge: 21     Additional Needles:   Procedures:,,,, ultrasound used (permanent image in chart),,    Narrative:  Start time: 04/18/2024 7:00 AM End time: 04/18/2024 7:05 AM Injection made incrementally with aspirations every 5 mL.  Performed by: Personally  Anesthesiologist: Erma Thom SAUNDERS, MD  Additional Notes: Discussed risks and benefits of the nerve block in detail, including but not limited vascular injury, permanent nerve damage and infection.   Patient tolerated the procedure well. Local anesthetic introduced in an incremental fashion under minimal resistance after negative aspirations. No paresthesias were elicited. After completion of the procedure, no acute issues were identified and patient continued to be monitored by RN.

## 2024-04-18 NOTE — Interval H&P Note (Signed)
 History and Physical Interval Note:  04/18/2024 6:46 AM  Andrew Foster  has presented today for surgery, with the diagnosis of LEFT KNEE OSTEOARTHRITIS.  The various methods of treatment have been discussed with the patient and family. After consideration of risks, benefits and other options for treatment, the patient has consented to  Procedure(s): ARTHROPLASTY, KNEE, TOTAL (Left) as a surgical intervention.  The patient's history has been reviewed, patient examined, no change in status, stable for surgery.  I have reviewed the patient's chart and labs.  Questions were answered to the patient's satisfaction.     Dempsey JINNY Sensor

## 2024-04-18 NOTE — Progress Notes (Signed)
 Orthopedic Tech Progress Note Patient Details:  Andrew Foster 1941-07-13 [id number] Applied bone foam per order.  Ortho Devices Type of Ortho Device: Bone foam zero knee Ortho Device/Splint Location: LLE Ortho Device/Splint Interventions: Ordered, Application, Adjustment   Post Interventions Patient Tolerated: Well Instructions Provided: Adjustment of device, Care of device, Poper ambulation with device  Morna Pink 04/18/2024, 10:02 AM

## 2024-04-18 NOTE — Progress Notes (Signed)
 Attempted report

## 2024-04-18 NOTE — Evaluation (Signed)
 Physical Therapy Treatment Patient Details Name: Andrew Foster MRN: [id number] DOB: 1940-12-06 Today's Date: 04/18/2024   History of Present Illness 83 yo male presents to therapy s/p L TKA on 04/18/2024 due to failure of conservative measures. Pt PMH includes but is not limited to: diarrhea, hernia, bakers cyst L, BPPV, arthritis, HLD, GERD, kidney stones, HTN, and BPH.    PT Comments   Andrew Foster is a 83 y.o. male POD 0 s/p L TKA . Patient reports mod I  with mobility at baseline. Patient is now limited by functional impairments (see PT problem list below). PT initiated patient instruction in exercise to facilitate ROM and circulation to manage edema. Pt limited at time of eval due to poor LLE motor control coordination and strength impacting L LE stability required for progression with bed mobility, transfers and gait tasks attributed to slow regression of anesthesia.  Patient will benefit from continued skilled PT interventions to address impairments and progress towards PLOF. Acute PT will follow to progress mobility and stair training in preparation for safe discharge home. PT to return later in day to re-assess pt safety and readiness for same day d/c pt, family and nursing staff aware.     If plan is discharge home, recommend the following: A little help with walking and/or transfers;A little help with bathing/dressing/bathroom;Assistance with cooking/housework;Assist for transportation;Help with stairs or ramp for entrance   Can travel by private vehicle        Equipment Recommendations  Rolling walker (2 wheels)    Recommendations for Other Services       Precautions / Restrictions Precautions Precautions: Knee;Fall Restrictions Weight Bearing Restrictions Per Provider Order: No     Mobility  Bed Mobility                    Transfers                        Ambulation/Gait                   Stairs             Wheelchair  Mobility     Tilt Bed    Modified Rankin (Stroke Patients Only)       Balance Overall balance assessment: Needs assistance                                          Communication Communication Communication: No apparent difficulties  Cognition Arousal: Alert Behavior During Therapy: WFL for tasks assessed/performed   PT - Cognitive impairments: No apparent impairments                         Following commands: Intact      Cueing    Exercises Total Joint Exercises Ankle Circles/Pumps: AROM, Both, 10 reps Quad Sets: Left, 5 reps, AAROM Short Arc Quad: PROM, Left, 5 reps Heel Slides: AROM, Left, 5 reps Hip ABduction/ADduction: AROM, Left, 5 reps Straight Leg Raises: PROM, Left, 5 reps    General Comments        Pertinent Vitals/Pain Pain Assessment Pain Assessment: 0-10 Pain Score: 0-No pain Pain Location: L knee and LE Pain Descriptors / Indicators:  (just starting to feel it) Pain Intervention(s): Limited activity within patient's tolerance, Monitored during session, Premedicated before session, Repositioned, Ice applied  Home Living Family/patient expects to be discharged to:: Private residence Living Arrangements: Alone Available Help at Discharge: Family Type of Home: House Home Access: Level entry       Home Layout: One level Home Equipment: Toilet riser;Cane - single point Additional Comments: Son and daughter in law live next door and will help at time of d/c    Prior Function            PT Goals (current goals can now be found in the care plan section) Acute Rehab PT Goals Patient Stated Goal: to be able get back to doing house work and working on [employer] PT Goal Formulation: With patient Time For Goal Achievement: 05/09/24 Potential to Achieve Goals: Good    Frequency    7X/week      PT Plan      Co-evaluation              AM-PAC PT 6 Clicks Mobility   Outcome Measure  Help needed  turning from your back to your side while in a flat bed without using bedrails?: None Help needed moving from lying on your back to sitting on the side of a flat bed without using bedrails?: A Little Help needed moving to and from a bed to a chair (including a wheelchair)?: Total Help needed standing up from a chair using your arms (e.g., wheelchair or bedside chair)?: Total Help needed to walk in hospital room?: Total Help needed climbing 3-5 steps with a railing? : Total 6 Click Score: 11    End of Session   Activity Tolerance: Other (comment) (slow regression of anethesia impacting L LE motor control and coordination to progress with bed mobility, transfers and gait tasks) Patient left: in bed;with call bell/phone within reach;with family/visitor present Nurse Communication: Mobility status;Other (comment) (pt not ready at this time for same day d/c) PT Visit Diagnosis: Unsteadiness on feet (R26.81);Other abnormalities of gait and mobility (R26.89);Muscle weakness (generalized) (M62.81);Difficulty in walking, not elsewhere classified (R26.2);Pain Pain - Right/Left: Left Pain - part of body: Knee;Leg     Time: 8776-8753 PT Time Calculation (min) (ACUTE ONLY): 23 min  Charges:    $Therapeutic Exercise: 8-22 mins PT General Charges $$ ACUTE PT VISIT: 1 Visit                     [place], PT Acute Rehab    [place] VEAR Drone 04/18/2024, 1:00 PM

## 2024-04-18 NOTE — Transfer of Care (Signed)
 Immediate Anesthesia Transfer of Care Note  Patient: Andrew Foster  Procedure(s) Performed: ARTHROPLASTY, KNEE, TOTAL (Left: Knee)  Patient Location: PACU  Anesthesia Type:Spinal  Level of Consciousness: awake  Airway & Oxygen Therapy: Patient Spontanous Breathing  Post-op Assessment: Report given to RN and Post -op Vital signs reviewed and stable  Post vital signs: Reviewed and stable  Last Vitals:  Vitals Value Taken Time  BP    Temp    Pulse    Resp    SpO2      Last Pain:  Vitals:   04/18/24 0557  TempSrc:   PainSc: 0-No pain         Complications: No notable events documented.

## 2024-04-18 NOTE — Op Note (Signed)
 PATIENT ID:      Andrew Foster  MRN:     [id number] DOB/AGE:    [birth year] / 83 y.o.       OPERATIVE REPORT   DATE OF PROCEDURE:  04/18/2024      PREOPERATIVE DIAGNOSIS:   LEFT KNEE OSTEOARTHRITIS      Estimated body mass index is 26.99 kg/m as calculated from the following:   Height as of this encounter: 5' 6.5 (1.689 m).   Weight as of this encounter: 77 kg.                                                       POSTOPERATIVE DIAGNOSIS:   Same                                                                  PROCEDURE:  Procedure(s): ARTHROPLASTY, KNEE, TOTAL Using DepuyAttune RP implants #6L Femur, #7Tibia, 8 mm Attune RP bearing, 38 Patella    SURGEON: Dempsey JINNY Sensor  ASSISTANT:   Camellia POUR. [employer]   (Present and scrubbed throughout the case, critical for assistance with exposure, retraction, instrumentation, and closure.)        ANESTHESIA: Spinal, 20cc Exparel , 50cc 0.25% Marcaine  EBL: 400 cc FLUID REPLACEMENT: 1500 cc crystaloid TOURNIQUET: none DRAINS: None TRANEXAMIC ACID : 1gm IV, 2gm topical COMPLICATIONS:  None         INDICATIONS FOR PROCEDURE: The patient has  LEFT KNEE OSTEOARTHRITIS, Var deformities, XR shows bone on bone arthritis, lateral subluxation of tibia. Patient has failed all conservative measures including anti-inflammatory medicines, narcotics, attempts at exercise and weight loss, cortisone injections and viscosupplementation.  Risks and benefits of surgery have been discussed, questions answered.   DESCRIPTION OF PROCEDURE: The patient identified by armband, received  IV antibiotics, in the holding area at [facility]. Patient taken to the operating room, appropriate anesthetic monitors were attached, and Spinal anesthesia was  induced. IV Tranexamic acid  was given. Lateral post and 2 surefoot positioners applied to the table, the lower extremity was then prepped and draped in usual sterile fashion from the toes to the high thigh. Time-out  procedure was performed. Camellia POUR. [facility] PAC, was present and scrubbed throughout the case, critical for assistance with, positioning, exposure, retraction, instrumentation, and closure.The skin and subcutaneous tissue along the incision was injected with 20 cc of a mixture of 20cc Exparel  and 30cc Marcaine  50cc saline solution, using a 21-gauge by 1-1/2 inch needle. We began the operation, with the knee flexed 130 degrees, by making the anterior midline incision starting at handbreadth above the patella going over the patella 1 cm medial to and 4 cm distal to the tibial tubercle. Small bleeders in the skin and the subcutaneous tissue identified and cauterized. Transverse retinaculum was incised and reflected medially and a medial parapatellar arthrotomy was accomplished. the patella was everted and theprepatellar fat pad resected. The superficial medial collateral ligament was then elevated from anterior to posterior along the proximal flare of the tibia and anterior half of the menisci resected. The knee was hyperflexed exposing bone on bone arthritis. Peripheral  and notch osteophytes as well as the cruciate ligaments were then resected. We continued to work our way around posteriorly along the proximal tibia, and externally rotated the tibia subluxing it out from underneath the femur. A McHale PCL retractor was placed through the notch, a lateral Hohmann retractor, and anterolateral small homan retractor placed. We then entered the proximal tibia with the Depuy starter drill in line with the axis of the tibia followed by an intramedullary guide rod and 3 degree posterior slope cutting guide. The tibial cutting guide, was pinned into place allowing resection of 0 mm of bone medially and 10 mm of bone laterally. Satisfied with the tibial resection, we then entered the distal femur 2 mm anterior to the PCL origin with the starter drill, followed by the intramedullary guide rod and applied the distal femoral cutting  guide set at 9 mm, with 5 degrees of valgus. This was pinned along the epicondylar axis. At this point, the distal femoral cut was accomplished without difficulty. We then sized for a #6L femoral component and pinned the chamfer guide in 3 degrees of external rotation. The anterior, posterior, and chamfer cuts were accomplished without difficulty followed by the Attune RP box cutting guide and the box cut. We also removed posterior osteophytes from the posterior femoral condyles. The posterior capsule was injected with Exparel  solution. The knee was brought into full extension. We checked our extension gap and fit a 8 mm trial lollipop. Distracting in extension with a lamina spreader,  bleeders in the posterior capsule, Posterior medial and posterior lateral gutter were cauterized.  The transexamic acid-soaked sponge was then placed in the gap of the knee in extension. The knee was flexed 30. The posterior patella cut was accomplished with the 9.5 mm Attune cutting guide, sized for a 38 mm dome, and the fixation pegs drilled.The knee was then once again hyperflexed exposing the proximal tibia. We sized for a # 7 tibial base plate, applied the smokestack and the conical reamer followed by the the Delta fin keel punch. We then hammered into place the Attune RP trial femoral component, drilled the lugs, inserted a  8 mm trial bearing, trial patellar button, and took the knee through range of motion from 0-130 degrees. Medial and lateral ligamentous stability was checked. No thumb pressure was required for patellar Tracking.  All trial components were removed, mating surfaces irrigated with pulse lavage, and dried with suction and sponges. Exparel  solution was applied to the cancellus bone of the patella distal femur and proximal tibia.  After waiting 30 seconds, the bony surfaces were again, dried with sponges. A double batch of DePuy HV cement was mixed and applied to all bony metallic mating surfaces except for the  posterior condyles of the femur itself. In order, we hammered into place the tibial tray and removed excess cement, the femoral component and removed excess cement. The final Attune RP bearing was inserted, and the knee brought to full extension with compression. The patellar button was clamped into place, and excess cement removed. The knee was held at 30 flexion with compression using the second surefoot, while the cement cured. The wound was irrigated out with normal saline solution pulse lavage. The rest of the Exparel  was injected into the parapatellar arthrotomy, subcutaneous tissues, and periosteal tissues. The parapatellar arthrotomy was closed with running #1 Vicryl suture. The subcutaneous tissue with 3-0 undyed Vicryl suture, and the skin with running 3-0 SQ vicryl. An Aquacil dressing and Ace wrap were applied. The  patient was taken to recovery room without difficulty.   Dempsey JINNY Sensor 04/18/2024, 8:41 AM

## 2024-04-18 NOTE — Anesthesia Procedure Notes (Signed)
 Spinal  Patient location during procedure: OR Start time: 04/18/2024 7:30 AM End time: 04/18/2024 7:35 AM Reason for block: surgical anesthesia Staffing Performed: anesthesiologist  Anesthesiologist: Erma Thom SAUNDERS, MD Performed by: Erma Thom SAUNDERS, MD Authorized by: Erma Thom SAUNDERS, MD   Preanesthetic Checklist Completed: patient identified, IV checked, site marked, risks and benefits discussed, surgical consent, monitors and equipment checked, pre-op evaluation and timeout performed Spinal Block Patient position: sitting Prep: DuraPrep Patient monitoring: heart rate, cardiac monitor, continuous pulse ox and blood pressure Approach: midline Location: L4-5 Needle Needle type: Pencan  Needle gauge: 24 G Assessment Sensory level: T6 Additional Notes Functioning IV was confirmed and monitors were applied. Sterile prep and drape, including hand hygiene and sterile gloves were used. The patient was positioned and the spine was prepped. The skin was anesthetized with lidocaine .  Free flow of clear CSF was obtained prior to injecting local anesthetic into the CSF.  The spinal needle aspirated freely following injection.  The needle was carefully withdrawn.  The patient tolerated the procedure well.

## 2024-04-18 NOTE — Anesthesia Postprocedure Evaluation (Signed)
 Anesthesia Post Note  Patient: Andrew Foster  Procedure(s) Performed: ARTHROPLASTY, KNEE, TOTAL (Left: Knee)     Patient location during evaluation: PACU Anesthesia Type: Regional and Spinal Level of consciousness: awake and alert Pain management: pain level controlled Vital Signs Assessment: post-procedure vital signs reviewed and stable Respiratory status: spontaneous breathing, nonlabored ventilation, respiratory function stable and patient connected to nasal cannula oxygen Cardiovascular status: blood pressure returned to baseline and stable Postop Assessment: no apparent nausea or vomiting Anesthetic complications: no   No notable events documented.  Last Vitals:  Vitals:   04/18/24 1100 04/18/24 1110  BP:  124/75  Pulse: (!) 56 (!) 58  Resp: 12 16  Temp: (!) 36.4 C 36.4 C  SpO2: 95% 99%    Last Pain:  Vitals:   04/18/24 1110  TempSrc: Oral  PainSc: 0-No pain                 Thom JONELLE Peoples

## 2024-04-18 NOTE — Progress Notes (Signed)
 Physical Therapy Treatment Patient Details Name: Andrew Foster MRN: [id number] DOB: Jul 24, 1941 Today's Date: 04/18/2024   History of Present Illness 83 yo male presents to therapy s/p L TKA on 04/18/2024 due to failure of conservative measures. Pt PMH includes but is not limited to: diarrhea, hernia, bakers cyst L, BPPV, arthritis, HLD, GERD, kidney stones, HTN, and BPH.    PT Comments   Andrew Foster is a 83 y.o. male POD 0 s/p L TKA. Patient reports mod I with mobility at baseline.  PT returned ~ 1.5 hrs s/o eval to re-assess pt safety and readiness for same day d/c. Pt indicated pain has increased 7/10 and pt ed provided on use of call bell to communicate needs to nursing staff. Patient is now limited by functional impairments (see PT problem list below) and requires S supine <> sit for bed mobility and min A for transfers with L KI donned and pt self limiting L LE WB with B UE support at RW. Patient was able to ambulate 3 feet anteriorly and retrograde with RW and KI donned with min A level of assist due to L knee instability. Pt continues to be able to actively perform SLR nor SAQ. Pt ed provided on L LE instability increasing fall risk even with KI donned, pt and PT in agreement that pt is not safe for same day d/c at this time. Patient will benefit from continued skilled PT interventions to address impairments and progress towards PLOF. Acute PT will follow to progress mobility in preparation for safe discharge home.    If plan is discharge home, recommend the following: A little help with walking and/or transfers;A little help with bathing/dressing/bathroom;Assistance with cooking/housework;Assist for transportation;Help with stairs or ramp for entrance   Can travel by private vehicle        Equipment Recommendations  Rolling walker (2 wheels)    Recommendations for Other Services       Precautions / Restrictions Precautions Precautions: Knee;Fall Restrictions Weight Bearing  Restrictions Per Provider Order: No     Mobility  Bed Mobility Overal bed mobility: Needs Assistance Bed Mobility: Supine to Sit, Sit to Supine     Supine to sit: Supervision, Used rails, HOB elevated Sit to supine: Supervision   General bed mobility comments: min cues    Transfers Overall transfer level: Needs assistance Equipment used: Rolling walker (2 wheels) Transfers: Sit to/from Stand, Bed to chair/wheelchair/BSC Sit to Stand: Min assist Stand pivot transfers: Min assist         General transfer comment: cues for safety, min A due to instability with L knee with KI donned with pt limiting LLE WB SPT bed to Andrew Foster and sit to stand from EOB    Ambulation/Gait Ambulation/Gait assistance: Min assist Gait Distance (Feet): 3 Feet Assistive device: Rolling walker (2 wheels) Gait Pattern/deviations: Step-to pattern, Antalgic, Trunk flexed, Decreased stance time - left Gait velocity: decreased     General Gait Details: RW with KI donned, pt limiting LLE WB with B UE support at RW pt encouraged to place weight through L LE to determine if KI would provide enough stabiltiy to tansition pt home safely same day, PT noted and pt reported ongoing L LE instability with L knee buckling with KI in place 3 feet anteriorly and 3 feet retrograde stepping pattern   Stairs             Wheelchair Mobility     Tilt Bed    Modified Rankin (Stroke Patients Only)  Balance Overall balance assessment: Needs assistance Sitting-balance support: Feet supported Sitting balance-Leahy Scale: Fair     Standing balance support: Bilateral upper extremity supported, During functional activity, Reliant on assistive device for balance Standing balance-Leahy Scale: Zero                              Communication Communication Communication: No apparent difficulties  Cognition Arousal: Alert Behavior During Therapy: WFL for tasks assessed/performed   PT - Cognitive  impairments: No apparent impairments                         Following commands: Intact      Cueing    Exercises Total Joint Exercises Ankle Circles/Pumps: AROM, Both, 10 reps Quad Sets: Left, 5 reps, AAROM Short Arc Quad: PROM, Left, 5 reps Heel Slides: AROM, Left, 5 reps Hip ABduction/ADduction: AROM, Left, 5 reps Straight Leg Raises: PROM, Left, 5 reps    General Comments        Pertinent Vitals/Pain Pain Assessment Pain Assessment: 0-10 Pain Score: 7  (decreased to 5/10 at end of tx session) Pain Location: L knee and LE Pain Descriptors / Indicators: Aching, Constant, Discomfort, Dull, Grimacing, Operative site guarding Pain Intervention(s): Limited activity within patient's tolerance, Monitored during session, Repositioned, Patient requesting pain meds-RN notified, Ice applied    Home Living Family/patient expects to be discharged to:: Private residence Living Arrangements: Alone Available Help at Discharge: Family Type of Home: House Home Access: Level entry       Home Layout: One level Home Equipment: Surveyor, minerals - single point Additional Comments: Son and daughter in law live next door and will help at time of d/c    Prior Function            PT Goals (current goals can now be found in the care plan section) Acute Rehab PT Goals Patient Stated Goal: to be able get back to doing house work and working on [employer] PT Goal Formulation: With patient Time For Goal Achievement: 05/09/24 Potential to Achieve Goals: Good Progress towards PT goals: Progressing toward goals (slowly)    Frequency    7X/week      PT Plan      Co-evaluation              AM-PAC PT 6 Clicks Mobility   Outcome Measure  Help needed turning from your back to your side while in a flat bed without using bedrails?: None Help needed moving from lying on your back to sitting on the side of a flat bed without using bedrails?: A Little Help needed  moving to and from a bed to a chair (including a wheelchair)?: A Little Help needed standing up from a chair using your arms (e.g., wheelchair or bedside chair)?: A Little Help needed to walk in Foster room?: A Lot Help needed climbing 3-5 steps with a railing? : Total 6 Click Score: 16    End of Session   Activity Tolerance: Other (comment) (slow regression of anethesia impacting L LE motor control and coordination to progress safely with same day d/c able to perofrm bed mobility, transfers and gait tasks with KI donned and continued L knee instabiltiy) Patient left: in bed;with call bell/phone within reach Nurse Communication: Mobility status;Other (comment) (pt not ready at this time for same day d/c and PT recommending pt remain in Foster for observation, pt in agreement) PT Visit  Diagnosis: Unsteadiness on feet (R26.81);Other abnormalities of gait and mobility (R26.89);Muscle weakness (generalized) (M62.81);Difficulty in walking, not elsewhere classified (R26.2);Pain Pain - Right/Left: Left Pain - part of body: Knee;Leg     Time: 8576-8544 PT Time Calculation (min) (ACUTE ONLY): 32 min  Charges:    $Gait Training: 8-22 mins $Therapeutic Exercise: 8-22 mins $Therapeutic Activity: 8-22 mins PT General Charges $$ ACUTE PT VISIT: 1 Visit                     Andrew Foster, PT Acute Rehab    Andrew Foster VEAR Drone 04/18/2024, 3:00 PM

## 2024-04-18 NOTE — Plan of Care (Signed)
 ?  Problem: Clinical Measurements: ?Goal: Will remain free from infection ?Outcome: Progressing ?  ?

## 2024-04-19 ENCOUNTER — Encounter (HOSPITAL_COMMUNITY): Payer: Self-pay | Admitting: Orthopedic Surgery

## 2024-04-19 DIAGNOSIS — Z96652 Presence of left artificial knee joint: Secondary | ICD-10-CM | POA: Diagnosis not present

## 2024-04-19 DIAGNOSIS — M1712 Unilateral primary osteoarthritis, left knee: Secondary | ICD-10-CM | POA: Diagnosis not present

## 2024-04-19 DIAGNOSIS — Z87891 Personal history of nicotine dependence: Secondary | ICD-10-CM | POA: Diagnosis not present

## 2024-04-19 DIAGNOSIS — Z7982 Long term (current) use of aspirin: Secondary | ICD-10-CM | POA: Diagnosis not present

## 2024-04-19 DIAGNOSIS — I10 Essential (primary) hypertension: Secondary | ICD-10-CM | POA: Diagnosis not present

## 2024-04-19 NOTE — Discharge Summary (Signed)
 Patient ID: Andrew Foster MRN: [id number] DOB/AGE: 05-Jun-1941 83 y.o.  Admit date: 04/18/2024 Discharge date: 04/19/2024  Admission Diagnoses:  Principal Problem:   S/P total knee arthroplasty, left Active Problems:   Degenerative arthritis of left knee   Discharge Diagnoses:  Same  Past Medical History:  Diagnosis Date   Acute pyelonephritis 11/01/2016   hospitalized 02-20-24 to 02-23-24 for AKI sepsis   Adenomatous polyps of colon 01/2017   no f/u - age   Arthritis    BPH (benign prostatic hyperplasia)    Complication of anesthesia    slow to wake up   Diverticulosis    Elevated LFTs    GERD (gastroesophageal reflux disease)    History of kidney stones    Hypertension    Pre-diabetes    no Meds    Surgeries: Procedure(s): ARTHROPLASTY, KNEE, TOTAL on 04/18/2024   Consultants:   Discharged Condition: Improved  Hospital Course: CORWYN VORA is an 83 y.o. male who was admitted 04/18/2024 for operative treatment ofS/P total knee arthroplasty, left. Patient has severe unremitting pain that affects sleep, daily activities, and work/hobbies. After pre-op clearance the patient was taken to the operating room on 04/18/2024 and underwent  Procedure(s): ARTHROPLASTY, KNEE, TOTAL.    Patient was given perioperative antibiotics:  Anti-infectives (From admission, onward)    Start     Dose/Rate Route Frequency Ordered Stop   04/18/24 0600  ceFAZolin  (ANCEF ) IVPB 2g/100 mL premix        2 g 200 mL/hr over 30 Minutes Intravenous On call to O.R. 04/18/24 0530 04/18/24 0734        Patient was given sequential compression devices, early ambulation, and chemoprophylaxis to prevent DVT.  Inpatient Morphine Milligram Equivalents Per Day 8/18 - 8/19   Values displayed are in units of MME/Day    Order Start / End Date Yesterday Today    oxyCODONE  (Oxy IR/ROXICODONE ) immediate release tablet 5 mg 8/18 - 8/18 0 of Unknown --    oxyCODONE  (ROXICODONE ) 5 MG/5ML solution 5 mg 8/18 -  8/18 0 of Unknown --      Group total: 0 of Unknown     fentaNYL  (SUBLIMAZE ) injection 25-50 mcg 8/18 - 8/18 0 of 45-90 --    fentaNYL  (SUBLIMAZE ) injection 8/18 - 8/18 *15 of 15 --    HYDROmorphone  (DILAUDID ) injection 0.5-1 mg 8/18 - No end date 10 of 30-60 0 of 60-120    traMADol  (ULTRAM ) tablet 50 mg 8/18 - No end date 5 of 5 15 of 20    oxyCODONE  (Oxy IR/ROXICODONE ) immediate release tablet 5-10 mg 8/18 - No end date 0 of 15-30 0 of 45-90    oxyCODONE  (Oxy IR/ROXICODONE ) immediate release tablet 10-15 mg 8/18 - No end date 15 of 30-45 15 of 90-135    Daily Totals  * 45 of Unknown (at least 140-245) 30 of 215-365  *One-Step medication  Calculation Errors     Order Type Date Details   oxyCODONE  (Oxy IR/ROXICODONE ) immediate release tablet 5 mg Ordered Dose -- Insufficient frequency information   oxyCODONE  (ROXICODONE ) 5 MG/5ML solution 5 mg Ordered Dose -- Insufficient frequency information            Patient benefited maximally from hospital stay and there were no complications.    Recent vital signs: Patient Vitals for the past 24 hrs:  BP Temp Temp src Pulse Resp SpO2  04/19/24 1354 138/85 98 F (36.7 C) Oral 87 18 94 %  04/19/24 0542 131/82 97.7 F (  36.5 C) Oral 74 18 100 %  04/19/24 0324 130/74 98.3 F (36.8 C) Oral 67 18 100 %  04/18/24 2046 134/80 98.7 F (37.1 C) Oral 80 16 99 %  04/18/24 1702 (!) 157/82 97.9 F (36.6 C) -- 67 18 100 %  04/18/24 1530 139/79 -- -- (!) 59 -- 95 %     Recent laboratory studies: No results for input(s): WBC, HGB, HCT, PLT, NA, K, CL, CO2, BUN, CREATININE, GLUCOSE, INR, CALCIUM in the last 72 hours.  Invalid input(s): PT, 2   Discharge Medications:   Allergies as of 04/19/2024   No Known Allergies      Medication List     STOP taking these medications    traMADol  50 MG tablet Commonly known as: ULTRAM    Tylenol  325 MG tablet Generic drug: acetaminophen        TAKE these medications     aspirin  EC 81 MG tablet Take 1 tablet (81 mg total) by mouth 2 (two) times daily. What changed:  when to take this additional instructions   finasteride  5 MG tablet Commonly known as: PROSCAR  Take 5 mg by mouth every evening.   fluticasone  50 MCG/ACT nasal spray Commonly known as: FLONASE  Place 1 spray into both nostrils 2 (two) times daily as needed for allergies or rhinitis.   glucosamine-chondroitin 500-400 MG tablet Take 1 tablet by mouth daily.   meclizine  12.5 MG tablet Commonly known as: ANTIVERT  Take 1 tablet (12.5 mg total) by mouth 3 (three) times daily as needed for dizziness.   multivitamin tablet Take 1 tablet by mouth daily with breakfast.   oxyCODONE -acetaminophen  5-325 MG tablet Commonly known as: PERCOCET/ROXICET Take 1 tablet by mouth every 4 (four) hours as needed for severe pain (pain score 7-10).   pravastatin  20 MG tablet Commonly known as: PRAVACHOL  Take 1 tablet by mouth once daily   PriLOSEC  OTC 20 MG tablet Generic drug: omeprazole  Take 20 mg by mouth daily before breakfast.   solifenacin  5 MG tablet Commonly known as: VESICARE  Take 1 tablet (5 mg total) by mouth daily.   Systane Ultra PF 0.4-0.3 % Soln Generic drug: Polyethyl Glyc-Propyl Glyc PF Place 1 drop into both eyes 3 (three) times daily as needed (for dryness).   tamsulosin  0.4 MG Caps capsule Commonly known as: FLOMAX  Take 1 capsule by mouth once daily at bedtime   tiZANidine  2 MG tablet Commonly known as: ZANAFLEX  Take 1 tablet (2 mg total) by mouth every 6 (six) hours as needed for muscle spasms.   VITAMIN B PLUS+ PO Take 1 tablet by mouth daily.   Vitamin D3 25 MCG (1000 UT) Caps Take 1,000 Units by mouth daily.               Durable Medical Equipment  (From admission, onward)           Start     Ordered   04/18/24 1617  DME Walker rolling  Once       Question:  Patient needs a walker to treat with the following condition  Answer:  Status post total  left knee replacement   04/18/24 1616   04/18/24 1617  DME 3 n 1  Once        04/18/24 1616              Discharge Care Instructions  (From admission, onward)           Start     Ordered   04/19/24 0000  Weight bearing  as tolerated        04/19/24 1508   04/18/24 0000  Weight bearing as tolerated        04/18/24 0929            Diagnostic Studies: No results found.  Disposition: Discharge disposition: 01-Home or Self Care       Discharge Instructions     Call MD / Call 911   Complete by: As directed    If you experience chest pain or shortness of breath, CALL 911 and be transported to the hospital emergency room.  If you develope a fever above 101 F, pus (white drainage) or increased drainage or redness at the wound, or calf pain, call your surgeon's office.   Call MD / Call 911   Complete by: As directed    If you experience chest pain or shortness of breath, CALL 911 and be transported to the hospital emergency room.  If you develope a fever above 101 F, pus (white drainage) or increased drainage or redness at the wound, or calf pain, call your surgeon's office.   Constipation Prevention   Complete by: As directed    Drink plenty of fluids.  Prune juice may be helpful.  You may use a stool softener, such as Colace (over the counter) 100 mg twice a day.  Use MiraLax  (over the counter) for constipation as needed.   Constipation Prevention   Complete by: As directed    Drink plenty of fluids.  Prune juice may be helpful.  You may use a stool softener, such as Colace (over the counter) 100 mg twice a day.  Use MiraLax  (over the counter) for constipation as needed.   Diet - low sodium heart healthy   Complete by: As directed    Driving restrictions   Complete by: As directed    No driving for 2 weeks   Driving restrictions   Complete by: As directed    No driving for 2 weeks   Increase activity slowly as tolerated   Complete by: As directed    Increase  activity slowly as tolerated   Complete by: As directed    Patient may shower   Complete by: As directed    You may shower without a dressing once there is no drainage.  Do not wash over the wound.  If drainage remains, cover wound with plastic wrap and then shower.   Patient may shower   Complete by: As directed    You may shower without a dressing once there is no drainage.  Do not wash over the wound.  If drainage remains, cover wound with plastic wrap and then shower.   Post-operative opioid taper instructions:   Complete by: As directed    POST-OPERATIVE OPIOID TAPER INSTRUCTIONS: It is important to wean off of your opioid medication as soon as possible. If you do not need pain medication after your surgery it is ok to stop day one. Opioids include: Codeine, Hydrocodone (Norco, Vicodin), Oxycodone (Percocet, oxycontin ) and hydromorphone  amongst others.  Long term and even short term use of opiods can cause: Increased pain response Dependence Constipation Depression Respiratory depression And more.  Withdrawal symptoms can include Flu like symptoms Nausea, vomiting And more Techniques to manage these symptoms Hydrate well Eat regular healthy meals Stay active Use relaxation techniques(deep breathing, meditating, yoga) Do Not substitute Alcohol  to help with tapering If you have been on opioids for less than two weeks and do not have pain than it is ok to  stop all together.  Plan to wean off of opioids This plan should start within one week post op of your joint replacement. Maintain the same interval or time between taking each dose and first decrease the dose.  Cut the total daily intake of opioids by one tablet each day Next start to increase the time between doses. The last dose that should be eliminated is the evening dose.      Post-operative opioid taper instructions:   Complete by: As directed    POST-OPERATIVE OPIOID TAPER INSTRUCTIONS: It is important to wean off  of your opioid medication as soon as possible. If you do not need pain medication after your surgery it is ok to stop day one. Opioids include: Codeine, Hydrocodone (Norco, Vicodin), Oxycodone (Percocet, oxycontin ) and hydromorphone  amongst others.  Long term and even short term use of opiods can cause: Increased pain response Dependence Constipation Depression Respiratory depression And more.  Withdrawal symptoms can include Flu like symptoms Nausea, vomiting And more Techniques to manage these symptoms Hydrate well Eat regular healthy meals Stay active Use relaxation techniques(deep breathing, meditating, yoga) Do Not substitute Alcohol  to help with tapering If you have been on opioids for less than two weeks and do not have pain than it is ok to stop all together.  Plan to wean off of opioids This plan should start within one week post op of your joint replacement. Maintain the same interval or time between taking each dose and first decrease the dose.  Cut the total daily intake of opioids by one tablet each day Next start to increase the time between doses. The last dose that should be eliminated is the evening dose.      Weight bearing as tolerated   Complete by: As directed    Weight bearing as tolerated   Complete by: As directed         Follow-up Information     Liam Lerner, MD. Go on 04/28/2024.   Specialty: Orthopedic Surgery Why: Your appointment is scheduled for 10:00. Contact information: [address] [place] [address] [phone]         [facility], [place]. Go on 04/21/2024.   Why: Your outpatient physical therapy has been scheduled. The office will call you with a time Contact information: Physical Therapy [address] [place] [address] [phone]                  Signed: Camellia Ellen 04/19/2024, 3:09 PM

## 2024-04-19 NOTE — Progress Notes (Signed)
 Physical Therapy Treatment Patient Details Name: Andrew Foster MRN: [id number] DOB: [birth year] Today's Date: 04/19/2024   History of Present Illness 83 yo male presents to therapy s/p L TKA on 04/18/2024 due to failure of conservative measures. Pt PMH includes but is not limited to: diarrhea, hernia, bakers cyst L, BPPV, arthritis, HLD, GERD, kidney stones, HTN, and BPH.    PT Comments   KARMINE KAUER is a 83 y.o. male POD 1 s/p L TKA. Patient reports IND with mobility at baseline. Patient is now limited by functional impairments (see PT problem list below) and S for transfers. Patient was able to ambulate 120 feet with RW and CGA level of assist. Patient instructed in exercise to facilitate ROM and circulation to manage edema reviewed with HO provided. Patient will benefit from continued skilled PT interventions to address impairments and progress towards PLOF. Acute PT will follow to progress mobility and stair training in preparation for safe discharge home. Pt has completed mobility tasks, no overt LLE instability noted, pt ed provided on safety, fall risk prevention, pain management, use of CP/ice, slowly increasing activity level and completing HEP. Pt verbalized understanding and is ready from PT standpoint to transition home with family support and OPPT services.     If plan is discharge home, recommend the following: A little help with walking and/or transfers;A little help with bathing/dressing/bathroom;Assistance with cooking/housework;Assist for transportation;Help with stairs or ramp for entrance   Can travel by private vehicle        Equipment Recommendations  Rolling walker (2 wheels)    Recommendations for Other Services       Precautions / Restrictions Precautions Precautions: Knee;Fall Restrictions Weight Bearing Restrictions Per Provider Order: No     Mobility  Bed Mobility Overal bed mobility: Needs Assistance Bed Mobility: Supine to Sit     Supine to sit:  Supervision, HOB elevated     General bed mobility comments: pt seated in recliner and returned to recliner following tx session    Transfers Overall transfer level: Needs assistance Equipment used: Rolling walker (2 wheels) Transfers: Sit to/from Stand Sit to Stand: From elevated surface, Supervision           General transfer comment: min cues and push to stand from recliner with cues    Ambulation/Gait Ambulation/Gait assistance: Contact guard assist Gait Distance (Feet): 120 Feet Assistive device: Rolling walker (2 wheels) Gait Pattern/deviations: Step-to pattern, Antalgic, Trunk flexed, Decreased stance time - left, Decreased stance time - right Gait velocity: decreased     General Gait Details: min cues for posture, proper distance from RW, increased stride length with R LE and continous moton of RW as well as safety with turns and approach to sitting surfaces   Stairs Stairs: Yes Stairs assistance: Contact guard assist Stair Management: With walker Number of Stairs: 1 General stair comments: one 8 inch step with cues for proper RW placement and sequencing with step to pattern   Wheelchair Mobility     Tilt Bed    Modified Rankin (Stroke Patients Only)       Balance Overall balance assessment: Needs assistance Sitting-balance support: Feet supported Sitting balance-Leahy Scale: Good     Standing balance support: Bilateral upper extremity supported, During functional activity, Reliant on assistive device for balance Standing balance-Leahy Scale: Poor                              Communication Communication Communication: No  apparent difficulties  Cognition Arousal: Alert Behavior During Therapy: WFL for tasks assessed/performed   PT - Cognitive impairments: No apparent impairments                         Following commands: Intact      Cueing    Exercises Total Joint Exercises Ankle Circles/Pumps: AROM, Both, 10  reps Quad Sets: Left, 5 reps, AROM Short Arc Quad: Left, 5 reps, AAROM Heel Slides: AROM, Left, 5 reps Hip ABduction/ADduction: AROM, Left, 5 reps Straight Leg Raises: Left, 5 reps, AROM Knee Flexion: AROM, Left, 5 reps, Seated    General Comments        Pertinent Vitals/Pain Pain Assessment Pain Assessment: 0-10 Pain Score: 3  Pain Location: L knee and LE Pain Descriptors / Indicators: Aching, Constant, Discomfort, Dull, Grimacing, Operative site guarding Pain Intervention(s): Limited activity within patient's tolerance, Monitored during session, Premedicated before session, Repositioned, Ice applied    Home Living                          Prior Function            PT Goals (current goals can now be found in the care plan section) Acute Rehab PT Goals Patient Stated Goal: to be able get back to doing house work and working on [employer] cars PT Goal Formulation: With patient Time For Goal Achievement: 05/09/24 Potential to Achieve Goals: Good Progress towards PT goals: Progressing toward goals    Frequency    7X/week      PT Plan      Co-evaluation              AM-PAC PT 6 Clicks Mobility   Outcome Measure  Help needed turning from your back to your side while in a flat bed without using bedrails?: None Help needed moving from lying on your back to sitting on the side of a flat bed without using bedrails?: A Little Help needed moving to and from a bed to a chair (including a wheelchair)?: A Little Help needed standing up from a chair using your arms (e.g., wheelchair or bedside chair)?: A Little Help needed to walk in hospital room?: A Little Help needed climbing 3-5 steps with a railing? : A Little 6 Click Score: 19    End of Session Equipment Utilized During Treatment: Gait belt Activity Tolerance: Patient tolerated treatment well Patient left: in chair;with call bell/phone within reach Nurse Communication: Mobility status PT Visit  Diagnosis: Unsteadiness on feet (R26.81);Other abnormalities of gait and mobility (R26.89);Muscle weakness (generalized) (M62.81);Difficulty in walking, not elsewhere classified (R26.2);Pain Pain - Right/Left: Left Pain - part of body: Knee;Leg     Time: 8587-8563 PT Time Calculation (min) (ACUTE ONLY): 24 min  Charges:    $Gait Training: 8-22 mins $Therapeutic Activity: 8-22 mins PT General Charges $$ ACUTE PT VISIT: 1 Visit                     [place], PT Acute Rehab    [place] VEAR Drone 04/19/2024, 2:56 PM

## 2024-04-19 NOTE — Progress Notes (Signed)
 Patient ID: Andrew Foster, male   DOB: [birth year], 83 y.o.   MRN: [id number] PATIENT ID: Andrew Foster  MRN: [id number]  DOB/AGE:  11-11-1940 / 83 y.o.  1 Day Post-Op Procedure(s) (LRB): ARTHROPLASTY, KNEE, TOTAL (Left)    PROGRESS NOTE Subjective: Patient is alert, oriented, no Nausea, no Vomiting, yes passing gas. Taking PO well. Denies SOB, Chest or Calf Pain. Using Incentive Spirometer, PAS in place. Ambulate 3', Yesterday's total administered Morphine Milligram Equivalents: 45), Patient reports pain as 3/10 .    Objective: Vital signs in last 24 hours: Vitals:   04/18/24 1702 04/18/24 2046 04/19/24 0324 04/19/24 0542  BP: (!) 157/82 134/80 130/74 131/82  Pulse: 67 80 67 74  Resp: 18 16 18 18   Temp: 97.9 F (36.6 C) 98.7 F (37.1 C) 98.3 F (36.8 C) 97.7 F (36.5 C)  TempSrc:  Oral Oral Oral  SpO2: 100% 99% 100% 100%  Weight:      Height:          Intake/Output from previous day: I/O last 3 completed shifts: In: 4291.6 [P.O.:1080; I.V.:2511.6; IV Piggyback:700] Out: 4250 [Urine:4050; Blood:200]   Intake/Output this shift: No intake/output data recorded.   LABORATORY DATA: No results for input(s): WBC, HGB, HCT, PLT, NA, K, CL, CO2, BUN, CREATININE, GLUCOSE, GLUCAP, INR, CALCIUM in the last 72 hours.  Invalid input(s): PT, 2  Examination: Neurologically intact ABD soft Neurovascular intact Sensation intact distally Intact pulses distally Dorsiflexion/Plantar flexion intact Incision: dressing C/D/I No cellulitis present Compartment soft}  Assessment:   1 Day Post-Op Procedure(s) (LRB): ARTHROPLASTY, KNEE, TOTAL (Left) ADDITIONAL DIAGNOSIS: History of kidney stones Patient's anticipated LOS is less than 2 midnights, meeting these requirements: - Younger than 27 - Lives within 1 hour of care - Has a competent adult at home to recover with post-op recover - NO history of  - Chronic pain requiring opiods  - Diabetes  -  Coronary Artery Disease  - Heart failure  - Heart attack  - Stroke  - DVT/VTE  - Cardiac arrhythmia  - Respiratory Failure/COPD  - Renal failure  - Anemia  - Advanced Liver disease     Plan: PT/OT WBAT, AROM and PROM  DVT Prophylaxis:  SCDx72hrs, ASA 81 mg BID x 2 weeks DISCHARGE PLAN: Home, today if he passes physical therapy DISCHARGE NEEDS: HHPT, Walker, and 3-in-1 comode seat     Dempsey JINNY Sensor 04/19/2024, 7:48 AM

## 2024-04-19 NOTE — TOC Transition Note (Signed)
 Transition of Care [facility]) - Discharge Note   Patient Details  Name: Andrew Foster MRN: [id number] Date of Birth: Jan 16, 1941  [facility]) CM/SW Contact:  [name], RN Phone Number: 04/19/2024, 10:13 AM   Clinical Narrative: Met with patient at bedside to review dc therapy and home equipment needs, pt confirmed OPPT [facility] [address], has RW, MOON completed. No TOC needs.       Final next level of care: OP Rehab Barriers to Discharge: No Barriers Identified   Patient Goals and CMS Choice Patient states their goals for this hospitalization and ongoing recovery are:: return home          Discharge Placement                       Discharge Plan and Services Additional resources added to the After Visit Summary for                  DME Arranged: Walker rolling DME Agency: [employer]                  Social Drivers of Health (SDOH) Interventions SDOH Screenings   Food Insecurity: No Food Insecurity (04/18/2024)  Housing: Low Risk  (04/18/2024)  Transportation Needs: No Transportation Needs (04/18/2024)  Utilities: Not At Risk (04/18/2024)  Alcohol  Screen: Low Risk  (02/17/2024)  Depression (PHQ2-9): Low Risk  (03/03/2024)  Financial Resource Strain: Low Risk  (02/17/2024)  Physical Activity: Inactive (02/17/2024)  Social Connections: Socially Isolated (04/18/2024)  Stress: No Stress Concern Present (02/17/2024)  Tobacco Use: Medium Risk (04/18/2024)  Health Literacy: Adequate Health Literacy (02/17/2024)     Readmission Risk Interventions    02/22/2024    8:53 AM  Readmission Risk Prevention Plan  Post Dischage Appt Complete  Medication Screening Complete  Transportation Screening Complete

## 2024-04-19 NOTE — Care Management Obs Status (Signed)
 MEDICARE OBSERVATION STATUS NOTIFICATION   Patient Details  Name: Andrew Foster MRN: [id number] Date of Birth: May 13, 1941   Medicare Observation Status Notification Given:  Yes    [name], RN 04/19/2024, 9:59 AM

## 2024-04-19 NOTE — Progress Notes (Signed)
 Physical Therapy Treatment Patient Details Name: Andrew Foster MRN: [id number] DOB: 1940-11-03 Today's Date: 04/19/2024   History of Present Illness 83 yo male presents to therapy s/p L TKA on 04/18/2024 due to failure of conservative measures. Pt PMH includes but is not limited to: diarrhea, hernia, bakers cyst L, BPPV, arthritis, HLD, GERD, kidney stones, HTN, and BPH.    PT Comments   Andrew Foster is a 83 y.o. male POD 1 s/p L TKA. Patient reports IND with mobility at baseline. Patient is now limited by functional impairments (see PT problem list below) and requires S for bed mobility and CGA and cues for transfers. Patient was able to ambulate 80 feet with RW and CGA level of assist. Patient instructed in exercise to facilitate ROM and circulation to manage edema.  Patient will benefit from continued skilled PT interventions to address impairments and progress towards PLOF. Acute PT will follow to progress mobility and stair training in preparation for safe discharge home with family support and OPPT services. Pt reports L LE block wore off last night and was in some pain this am. PT arrived ~ 40 min s/p pain medication and pt states sore 4/10 L knee and LE. Pt exhibits improved LLE motor control and coordination this am and is able to complete SLR, pt continues to require AA for SAQ. Pt reported today there is one step to enter home from the sun room. PT to return in pm to determine pt safety with step navigation and readiness for d/c today.    If plan is discharge home, recommend the following: A little help with walking and/or transfers;A little help with bathing/dressing/bathroom;Assistance with cooking/housework;Assist for transportation;Help with stairs or ramp for entrance   Can travel by private vehicle        Equipment Recommendations  Rolling walker (2 wheels)    Recommendations for Other Services       Precautions / Restrictions Precautions Precautions:  Knee;Fall Restrictions Weight Bearing Restrictions Per Provider Order: No     Mobility  Bed Mobility Overal bed mobility: Needs Assistance Bed Mobility: Supine to Sit     Supine to sit: Supervision, HOB elevated     General bed mobility comments: min cues and increased time    Transfers Overall transfer level: Needs assistance Equipment used: Rolling walker (2 wheels) Transfers: Sit to/from Stand Sit to Stand: Contact guard assist, From elevated surface           General transfer comment: min cues and pull to stand    Ambulation/Gait Ambulation/Gait assistance: Contact guard assist Gait Distance (Feet): 80 Feet Assistive device: Rolling walker (2 wheels) Gait Pattern/deviations: Step-to pattern, Antalgic, Trunk flexed, Decreased stance time - left, Decreased stance time - right Gait velocity: decreased     General Gait Details: improved L knee stability no KI donned for gait or transfer tasks, pt exhibited short choppy step to pattern and cues provided for increased R stride length, continuous forward motion with RW, safety with turns and proper body position inside RW   Stairs             Wheelchair Mobility     Tilt Bed    Modified Rankin (Stroke Patients Only)       Balance Overall balance assessment: Needs assistance Sitting-balance support: Feet supported Sitting balance-Leahy Scale: Good     Standing balance support: Bilateral upper extremity supported, During functional activity, Reliant on assistive device for balance Standing balance-Leahy Scale: Poor  Communication Communication Communication: No apparent difficulties  Cognition Arousal: Alert Behavior During Therapy: WFL for tasks assessed/performed   PT - Cognitive impairments: No apparent impairments                         Following commands: Intact      Cueing    Exercises Total Joint Exercises Ankle Circles/Pumps:  AROM, Both, 10 reps Quad Sets: Left, 5 reps, AROM Short Arc Quad: Left, 5 reps, AAROM Heel Slides: AROM, Left, 5 reps Hip ABduction/ADduction: AROM, Left, 5 reps Straight Leg Raises: Left, 5 reps, AROM Knee Flexion: AROM, Left, 5 reps, Seated    General Comments        Pertinent Vitals/Pain Pain Assessment Pain Assessment: 0-10 Pain Score: 4  Pain Location: L knee and LE Pain Descriptors / Indicators: Aching, Constant, Discomfort, Dull, Grimacing, Operative site guarding Pain Intervention(s): Limited activity within patient's tolerance, Monitored during session, Premedicated before session, Repositioned, Ice applied    Home Living                          Prior Function            PT Goals (current goals can now be found in the care plan section) Acute Rehab PT Goals Patient Stated Goal: to be able get back to doing house work and working on [employer] cars PT Goal Formulation: With patient Time For Goal Achievement: 05/09/24 Potential to Achieve Goals: Good Progress towards PT goals: Progressing toward goals    Frequency    7X/week      PT Plan      Co-evaluation              AM-PAC PT 6 Clicks Mobility   Outcome Measure  Help needed turning from your back to your side while in a flat bed without using bedrails?: None Help needed moving from lying on your back to sitting on the side of a flat bed without using bedrails?: A Little Help needed moving to and from a bed to a chair (including a wheelchair)?: A Little Help needed standing up from a chair using your arms (e.g., wheelchair or bedside chair)?: A Little Help needed to walk in hospital room?: A Little Help needed climbing 3-5 steps with a railing? : Total 6 Click Score: 17    End of Session   Activity Tolerance: Patient tolerated treatment well Patient left: in chair;with call bell/phone within reach Nurse Communication: Mobility status PT Visit Diagnosis: Unsteadiness on feet  (R26.81);Other abnormalities of gait and mobility (R26.89);Muscle weakness (generalized) (M62.81);Difficulty in walking, not elsewhere classified (R26.2);Pain Pain - Right/Left: Left Pain - part of body: Knee;Leg     Time: 8992-8964 PT Time Calculation (min) (ACUTE ONLY): 28 min  Charges:    $Gait Training: 8-22 mins $Therapeutic Exercise: 8-22 mins PT General Charges $$ ACUTE PT VISIT: 1 Visit                     Andrew Foster, PT Acute Rehab    Andrew Foster Andrew Foster 04/19/2024, 11:51 AM

## 2024-04-19 NOTE — Addendum Note (Signed)
 Addendum  created 04/19/24 1010 by Erma Thom SAUNDERS, MD   Attestation recorded in Intraprocedure, Intraprocedure Attestations filed

## 2024-04-21 DIAGNOSIS — R262 Difficulty in walking, not elsewhere classified: Secondary | ICD-10-CM | POA: Diagnosis not present

## 2024-04-21 DIAGNOSIS — Z96652 Presence of left artificial knee joint: Secondary | ICD-10-CM | POA: Diagnosis not present

## 2024-04-21 DIAGNOSIS — M25662 Stiffness of left knee, not elsewhere classified: Secondary | ICD-10-CM | POA: Diagnosis not present

## 2024-04-22 DIAGNOSIS — R262 Difficulty in walking, not elsewhere classified: Secondary | ICD-10-CM | POA: Diagnosis not present

## 2024-04-22 DIAGNOSIS — Z96652 Presence of left artificial knee joint: Secondary | ICD-10-CM | POA: Diagnosis not present

## 2024-04-22 DIAGNOSIS — M25662 Stiffness of left knee, not elsewhere classified: Secondary | ICD-10-CM | POA: Diagnosis not present

## 2024-04-25 DIAGNOSIS — Z96652 Presence of left artificial knee joint: Secondary | ICD-10-CM | POA: Diagnosis not present

## 2024-04-25 DIAGNOSIS — M25662 Stiffness of left knee, not elsewhere classified: Secondary | ICD-10-CM | POA: Diagnosis not present

## 2024-04-25 DIAGNOSIS — R262 Difficulty in walking, not elsewhere classified: Secondary | ICD-10-CM | POA: Diagnosis not present

## 2024-04-27 DIAGNOSIS — R262 Difficulty in walking, not elsewhere classified: Secondary | ICD-10-CM | POA: Diagnosis not present

## 2024-04-27 DIAGNOSIS — Z96652 Presence of left artificial knee joint: Secondary | ICD-10-CM | POA: Diagnosis not present

## 2024-04-27 DIAGNOSIS — M25662 Stiffness of left knee, not elsewhere classified: Secondary | ICD-10-CM | POA: Diagnosis not present

## 2024-04-28 DIAGNOSIS — Z96652 Presence of left artificial knee joint: Secondary | ICD-10-CM | POA: Diagnosis not present

## 2024-04-28 DIAGNOSIS — R262 Difficulty in walking, not elsewhere classified: Secondary | ICD-10-CM | POA: Diagnosis not present

## 2024-04-28 DIAGNOSIS — M25662 Stiffness of left knee, not elsewhere classified: Secondary | ICD-10-CM | POA: Diagnosis not present

## 2024-05-02 ENCOUNTER — Other Ambulatory Visit: Payer: Self-pay | Admitting: Family

## 2024-05-03 DIAGNOSIS — R262 Difficulty in walking, not elsewhere classified: Secondary | ICD-10-CM | POA: Diagnosis not present

## 2024-05-03 DIAGNOSIS — M25662 Stiffness of left knee, not elsewhere classified: Secondary | ICD-10-CM | POA: Diagnosis not present

## 2024-05-03 DIAGNOSIS — Z96652 Presence of left artificial knee joint: Secondary | ICD-10-CM | POA: Diagnosis not present

## 2024-05-04 DIAGNOSIS — Z96652 Presence of left artificial knee joint: Secondary | ICD-10-CM | POA: Diagnosis not present

## 2024-05-04 DIAGNOSIS — R262 Difficulty in walking, not elsewhere classified: Secondary | ICD-10-CM | POA: Diagnosis not present

## 2024-05-04 DIAGNOSIS — M25662 Stiffness of left knee, not elsewhere classified: Secondary | ICD-10-CM | POA: Diagnosis not present

## 2024-05-06 ENCOUNTER — Other Ambulatory Visit (HOSPITAL_COMMUNITY): Payer: Self-pay

## 2024-05-06 DIAGNOSIS — M25662 Stiffness of left knee, not elsewhere classified: Secondary | ICD-10-CM | POA: Diagnosis not present

## 2024-05-06 DIAGNOSIS — R262 Difficulty in walking, not elsewhere classified: Secondary | ICD-10-CM | POA: Diagnosis not present

## 2024-05-06 DIAGNOSIS — Z96652 Presence of left artificial knee joint: Secondary | ICD-10-CM | POA: Diagnosis not present

## 2024-05-09 DIAGNOSIS — M25662 Stiffness of left knee, not elsewhere classified: Secondary | ICD-10-CM | POA: Diagnosis not present

## 2024-05-09 DIAGNOSIS — R262 Difficulty in walking, not elsewhere classified: Secondary | ICD-10-CM | POA: Diagnosis not present

## 2024-05-09 DIAGNOSIS — Z96652 Presence of left artificial knee joint: Secondary | ICD-10-CM | POA: Diagnosis not present

## 2024-05-09 NOTE — Telephone Encounter (Signed)
 Copied from CRM [id number]. Topic: Clinical - Prescription Issue >> May 09, 2024  4:25 PM Chasity T wrote: Reason for CRM: Pt has requested a medication refill on 05/02/24 for traMADol  (ULTRAM ) 50 MG table and it hasn't been sent to pharmacy as of yet. Please update pt on medication

## 2024-05-10 DIAGNOSIS — Z961 Presence of intraocular lens: Secondary | ICD-10-CM | POA: Diagnosis not present

## 2024-05-11 DIAGNOSIS — R262 Difficulty in walking, not elsewhere classified: Secondary | ICD-10-CM | POA: Diagnosis not present

## 2024-05-11 DIAGNOSIS — Z96652 Presence of left artificial knee joint: Secondary | ICD-10-CM | POA: Diagnosis not present

## 2024-05-11 DIAGNOSIS — M25662 Stiffness of left knee, not elsewhere classified: Secondary | ICD-10-CM | POA: Diagnosis not present

## 2024-05-13 DIAGNOSIS — R262 Difficulty in walking, not elsewhere classified: Secondary | ICD-10-CM | POA: Diagnosis not present

## 2024-05-13 DIAGNOSIS — Z96652 Presence of left artificial knee joint: Secondary | ICD-10-CM | POA: Diagnosis not present

## 2024-05-13 DIAGNOSIS — M25662 Stiffness of left knee, not elsewhere classified: Secondary | ICD-10-CM | POA: Diagnosis not present

## 2024-05-16 DIAGNOSIS — M25662 Stiffness of left knee, not elsewhere classified: Secondary | ICD-10-CM | POA: Diagnosis not present

## 2024-05-16 DIAGNOSIS — Z96652 Presence of left artificial knee joint: Secondary | ICD-10-CM | POA: Diagnosis not present

## 2024-05-16 DIAGNOSIS — R262 Difficulty in walking, not elsewhere classified: Secondary | ICD-10-CM | POA: Diagnosis not present

## 2024-05-18 DIAGNOSIS — R262 Difficulty in walking, not elsewhere classified: Secondary | ICD-10-CM | POA: Diagnosis not present

## 2024-05-18 DIAGNOSIS — M25662 Stiffness of left knee, not elsewhere classified: Secondary | ICD-10-CM | POA: Diagnosis not present

## 2024-05-18 DIAGNOSIS — Z96652 Presence of left artificial knee joint: Secondary | ICD-10-CM | POA: Diagnosis not present

## 2024-05-24 DIAGNOSIS — M25662 Stiffness of left knee, not elsewhere classified: Secondary | ICD-10-CM | POA: Diagnosis not present

## 2024-05-24 DIAGNOSIS — R262 Difficulty in walking, not elsewhere classified: Secondary | ICD-10-CM | POA: Diagnosis not present

## 2024-05-24 DIAGNOSIS — Z96652 Presence of left artificial knee joint: Secondary | ICD-10-CM | POA: Diagnosis not present

## 2024-05-26 DIAGNOSIS — R262 Difficulty in walking, not elsewhere classified: Secondary | ICD-10-CM | POA: Diagnosis not present

## 2024-05-26 DIAGNOSIS — Z96652 Presence of left artificial knee joint: Secondary | ICD-10-CM | POA: Diagnosis not present

## 2024-05-26 DIAGNOSIS — M25662 Stiffness of left knee, not elsewhere classified: Secondary | ICD-10-CM | POA: Diagnosis not present

## 2024-05-30 DIAGNOSIS — Z96652 Presence of left artificial knee joint: Secondary | ICD-10-CM | POA: Diagnosis not present

## 2024-05-30 DIAGNOSIS — R262 Difficulty in walking, not elsewhere classified: Secondary | ICD-10-CM | POA: Diagnosis not present

## 2024-05-30 DIAGNOSIS — M25662 Stiffness of left knee, not elsewhere classified: Secondary | ICD-10-CM | POA: Diagnosis not present

## 2024-06-01 DIAGNOSIS — Z96652 Presence of left artificial knee joint: Secondary | ICD-10-CM | POA: Diagnosis not present

## 2024-06-01 DIAGNOSIS — M25662 Stiffness of left knee, not elsewhere classified: Secondary | ICD-10-CM | POA: Diagnosis not present

## 2024-06-01 DIAGNOSIS — R262 Difficulty in walking, not elsewhere classified: Secondary | ICD-10-CM | POA: Diagnosis not present

## 2024-06-02 NOTE — Telephone Encounter (Signed)
 Error

## 2024-06-06 DIAGNOSIS — M25662 Stiffness of left knee, not elsewhere classified: Secondary | ICD-10-CM | POA: Diagnosis not present

## 2024-06-06 DIAGNOSIS — Z96652 Presence of left artificial knee joint: Secondary | ICD-10-CM | POA: Diagnosis not present

## 2024-06-08 DIAGNOSIS — M25662 Stiffness of left knee, not elsewhere classified: Secondary | ICD-10-CM | POA: Diagnosis not present

## 2024-06-08 DIAGNOSIS — Z96652 Presence of left artificial knee joint: Secondary | ICD-10-CM | POA: Diagnosis not present

## 2024-06-25 ENCOUNTER — Other Ambulatory Visit: Payer: Self-pay | Admitting: Internal Medicine

## 2024-06-26 ENCOUNTER — Other Ambulatory Visit: Payer: Self-pay | Admitting: Internal Medicine

## 2024-07-04 ENCOUNTER — Other Ambulatory Visit: Payer: Self-pay | Admitting: Family

## 2024-09-25 ENCOUNTER — Other Ambulatory Visit: Payer: Self-pay | Admitting: Internal Medicine

## 2025-02-17 ENCOUNTER — Ambulatory Visit
